# Patient Record
Sex: Female | Born: 1975 | Race: White | Hispanic: No | Marital: Married | Smoking: Never smoker
Health system: Southern US, Community
[De-identification: ages and names within clinical notes are randomized; demographics above are authoritative.]

## PROBLEM LIST (undated history)

## (undated) DIAGNOSIS — R0602 Shortness of breath: Secondary | ICD-10-CM

## (undated) DIAGNOSIS — R112 Nausea with vomiting, unspecified: Secondary | ICD-10-CM

## (undated) DIAGNOSIS — G4710 Hypersomnia, unspecified: Secondary | ICD-10-CM

## (undated) DIAGNOSIS — R51 Headache: Secondary | ICD-10-CM

## (undated) DIAGNOSIS — D649 Anemia, unspecified: Secondary | ICD-10-CM

## (undated) DIAGNOSIS — M549 Dorsalgia, unspecified: Secondary | ICD-10-CM

## (undated) DIAGNOSIS — R600 Localized edema: Secondary | ICD-10-CM

## (undated) DIAGNOSIS — R5382 Chronic fatigue, unspecified: Secondary | ICD-10-CM

## (undated) DIAGNOSIS — N979 Female infertility, unspecified: Secondary | ICD-10-CM

## (undated) DIAGNOSIS — E538 Deficiency of other specified B group vitamins: Secondary | ICD-10-CM

## (undated) DIAGNOSIS — G4730 Sleep apnea, unspecified: Secondary | ICD-10-CM

## (undated) DIAGNOSIS — K829 Disease of gallbladder, unspecified: Secondary | ICD-10-CM

## (undated) DIAGNOSIS — F418 Other specified anxiety disorders: Secondary | ICD-10-CM

## (undated) DIAGNOSIS — I10 Essential (primary) hypertension: Secondary | ICD-10-CM

## (undated) DIAGNOSIS — G4700 Insomnia, unspecified: Secondary | ICD-10-CM

## (undated) DIAGNOSIS — D509 Iron deficiency anemia, unspecified: Secondary | ICD-10-CM

## (undated) DIAGNOSIS — N393 Stress incontinence (female) (male): Secondary | ICD-10-CM

## (undated) DIAGNOSIS — Z8711 Personal history of peptic ulcer disease: Secondary | ICD-10-CM

## (undated) DIAGNOSIS — Z8669 Personal history of other diseases of the nervous system and sense organs: Secondary | ICD-10-CM

## (undated) DIAGNOSIS — K580 Irritable bowel syndrome with diarrhea: Secondary | ICD-10-CM

## (undated) DIAGNOSIS — F32A Depression, unspecified: Secondary | ICD-10-CM

## (undated) DIAGNOSIS — G43909 Migraine, unspecified, not intractable, without status migrainosus: Secondary | ICD-10-CM

## (undated) DIAGNOSIS — K432 Incisional hernia without obstruction or gangrene: Secondary | ICD-10-CM

## (undated) DIAGNOSIS — F909 Attention-deficit hyperactivity disorder, unspecified type: Secondary | ICD-10-CM

## (undated) DIAGNOSIS — K219 Gastro-esophageal reflux disease without esophagitis: Secondary | ICD-10-CM

## (undated) DIAGNOSIS — M722 Plantar fascial fibromatosis: Secondary | ICD-10-CM

## (undated) DIAGNOSIS — G4733 Obstructive sleep apnea (adult) (pediatric): Secondary | ICD-10-CM

## (undated) DIAGNOSIS — N819 Female genital prolapse, unspecified: Secondary | ICD-10-CM

## (undated) DIAGNOSIS — F411 Generalized anxiety disorder: Secondary | ICD-10-CM

## (undated) DIAGNOSIS — F329 Major depressive disorder, single episode, unspecified: Secondary | ICD-10-CM

## (undated) DIAGNOSIS — IMO0002 Reserved for concepts with insufficient information to code with codable children: Secondary | ICD-10-CM

## (undated) DIAGNOSIS — K589 Irritable bowel syndrome without diarrhea: Secondary | ICD-10-CM

## (undated) DIAGNOSIS — Z973 Presence of spectacles and contact lenses: Secondary | ICD-10-CM

## (undated) DIAGNOSIS — Z9889 Other specified postprocedural states: Secondary | ICD-10-CM

## (undated) DIAGNOSIS — F840 Autistic disorder: Secondary | ICD-10-CM

## (undated) DIAGNOSIS — Z8679 Personal history of other diseases of the circulatory system: Secondary | ICD-10-CM

## (undated) HISTORY — DX: Other specified anxiety disorders: F41.8

## (undated) HISTORY — DX: Hypersomnia, unspecified: G47.10

## (undated) HISTORY — DX: Attention-deficit hyperactivity disorder, unspecified type: F90.9

## (undated) HISTORY — DX: Irritable bowel syndrome, unspecified: K58.9

## (undated) HISTORY — DX: Disease of gallbladder, unspecified: K82.9

## (undated) HISTORY — DX: Plantar fascial fibromatosis: M72.2

## (undated) HISTORY — DX: Localized edema: R60.0

## (undated) HISTORY — DX: Essential (primary) hypertension: I10

## (undated) HISTORY — DX: Sleep apnea, unspecified: G47.30

## (undated) HISTORY — DX: Obstructive sleep apnea (adult) (pediatric): G47.33

## (undated) HISTORY — DX: Deficiency of other specified B group vitamins: E53.8

## (undated) HISTORY — DX: Dorsalgia, unspecified: M54.9

## (undated) HISTORY — DX: Gastro-esophageal reflux disease without esophagitis: K21.9

## (undated) HISTORY — DX: Shortness of breath: R06.02

## (undated) HISTORY — DX: Female infertility, unspecified: N97.9

## (undated) HISTORY — DX: Autistic disorder: F84.0

## (undated) HISTORY — DX: Migraine, unspecified, not intractable, without status migrainosus: G43.909

## (undated) HISTORY — DX: Incisional hernia without obstruction or gangrene: K43.2

## (undated) HISTORY — DX: Depression, unspecified: F32.A

## (undated) HISTORY — DX: Personal history of peptic ulcer disease: Z87.11

## (undated) HISTORY — DX: Major depressive disorder, single episode, unspecified: F32.9

## (undated) HISTORY — DX: Chronic fatigue, unspecified: R53.82

## (undated) SURGERY — Surgical Case
Anesthesia: *Unknown

---

## 2003-11-25 DIAGNOSIS — Z9884 Bariatric surgery status: Secondary | ICD-10-CM

## 2003-11-25 HISTORY — PX: ROUX-EN-Y GASTRIC BYPASS: SHX1104

## 2003-11-25 HISTORY — DX: Bariatric surgery status: Z98.84

## 2004-11-24 HISTORY — PX: CHOLECYSTECTOMY: SHX55

## 2004-11-24 HISTORY — PX: LAPAROSCOPIC CHOLECYSTECTOMY: SUR755

## 2005-08-22 ENCOUNTER — Other Ambulatory Visit: Admission: RE | Admit: 2005-08-22 | Discharge: 2005-08-22 | Payer: Self-pay | Admitting: Family Medicine

## 2006-08-30 ENCOUNTER — Emergency Department (HOSPITAL_COMMUNITY): Admission: EM | Admit: 2006-08-30 | Discharge: 2006-08-30 | Payer: Self-pay | Admitting: Emergency Medicine

## 2007-03-15 ENCOUNTER — Ambulatory Visit (HOSPITAL_COMMUNITY): Payer: Self-pay | Admitting: Psychiatry

## 2007-04-13 ENCOUNTER — Ambulatory Visit (HOSPITAL_COMMUNITY): Payer: Self-pay | Admitting: Psychiatry

## 2007-07-30 ENCOUNTER — Ambulatory Visit (HOSPITAL_COMMUNITY): Payer: Self-pay | Admitting: Psychiatry

## 2007-11-02 ENCOUNTER — Ambulatory Visit (HOSPITAL_COMMUNITY): Payer: Self-pay | Admitting: Psychiatry

## 2008-02-03 ENCOUNTER — Ambulatory Visit (HOSPITAL_COMMUNITY): Payer: Self-pay | Admitting: Psychiatry

## 2008-08-11 ENCOUNTER — Ambulatory Visit (HOSPITAL_COMMUNITY): Payer: Self-pay | Admitting: Psychiatry

## 2008-12-20 ENCOUNTER — Ambulatory Visit (HOSPITAL_COMMUNITY): Payer: Self-pay | Admitting: Psychiatry

## 2009-01-16 ENCOUNTER — Emergency Department (HOSPITAL_COMMUNITY): Admission: EM | Admit: 2009-01-16 | Discharge: 2009-01-16 | Payer: Self-pay | Admitting: Emergency Medicine

## 2009-01-23 ENCOUNTER — Ambulatory Visit (HOSPITAL_COMMUNITY): Payer: Self-pay | Admitting: Psychiatry

## 2009-01-25 ENCOUNTER — Ambulatory Visit: Payer: Self-pay | Admitting: Family Medicine

## 2009-05-01 ENCOUNTER — Ambulatory Visit (HOSPITAL_COMMUNITY): Payer: Self-pay | Admitting: Psychiatry

## 2009-08-27 ENCOUNTER — Ambulatory Visit: Payer: Self-pay | Admitting: Family Medicine

## 2009-10-22 ENCOUNTER — Inpatient Hospital Stay (HOSPITAL_COMMUNITY): Admission: EM | Admit: 2009-10-22 | Discharge: 2009-10-26 | Payer: Self-pay | Admitting: Emergency Medicine

## 2009-10-23 HISTORY — PX: LAPAROSCOPIC GASTROTOMY W/ REPAIR OF ULCER: SUR772

## 2009-11-14 ENCOUNTER — Ambulatory Visit (HOSPITAL_COMMUNITY): Payer: Self-pay | Admitting: Psychiatry

## 2009-12-07 ENCOUNTER — Ambulatory Visit (HOSPITAL_COMMUNITY): Admission: RE | Admit: 2009-12-07 | Discharge: 2009-12-07 | Payer: Self-pay | Admitting: Gastroenterology

## 2009-12-27 ENCOUNTER — Ambulatory Visit: Payer: Self-pay | Admitting: Family Medicine

## 2009-12-27 DIAGNOSIS — F419 Anxiety disorder, unspecified: Secondary | ICD-10-CM | POA: Insufficient documentation

## 2009-12-27 DIAGNOSIS — F329 Major depressive disorder, single episode, unspecified: Secondary | ICD-10-CM

## 2009-12-27 DIAGNOSIS — R05 Cough: Secondary | ICD-10-CM

## 2009-12-27 DIAGNOSIS — R059 Cough, unspecified: Secondary | ICD-10-CM | POA: Insufficient documentation

## 2009-12-27 DIAGNOSIS — F3289 Other specified depressive episodes: Secondary | ICD-10-CM | POA: Insufficient documentation

## 2010-04-29 ENCOUNTER — Ambulatory Visit (HOSPITAL_COMMUNITY): Admission: RE | Admit: 2010-04-29 | Discharge: 2010-04-29 | Payer: Self-pay | Admitting: Family Medicine

## 2010-04-29 ENCOUNTER — Ambulatory Visit: Payer: Self-pay | Admitting: Family Medicine

## 2010-04-30 ENCOUNTER — Encounter: Admission: RE | Admit: 2010-04-30 | Discharge: 2010-06-11 | Payer: Self-pay | Admitting: Family Medicine

## 2010-05-07 ENCOUNTER — Ambulatory Visit (HOSPITAL_COMMUNITY): Admission: RE | Admit: 2010-05-07 | Discharge: 2010-05-07 | Payer: Self-pay | Admitting: Gastroenterology

## 2010-06-12 ENCOUNTER — Emergency Department (HOSPITAL_COMMUNITY): Admission: EM | Admit: 2010-06-12 | Discharge: 2010-06-12 | Payer: Self-pay | Admitting: Emergency Medicine

## 2010-10-16 ENCOUNTER — Ambulatory Visit (HOSPITAL_COMMUNITY): Payer: Self-pay | Admitting: Psychiatry

## 2010-11-08 ENCOUNTER — Ambulatory Visit (HOSPITAL_COMMUNITY)
Admission: RE | Admit: 2010-11-08 | Discharge: 2010-11-08 | Payer: Self-pay | Source: Home / Self Care | Attending: Gastroenterology | Admitting: Gastroenterology

## 2010-12-26 NOTE — Assessment & Plan Note (Signed)
Summary: SORE THROAT & COUGH/KH   Vital Signs:  Patient Profile:   35 Years Old Female CC:      Cold & URI symptoms Height:     67 inches Weight:      157 pounds O2 Sat:      98 % O2 treatment:    Room Air Temp:     99.2 degrees F oral Pulse rate:   110 / minute Pulse rhythm:   regular Resp:     16 per minute BP sitting:   123 / 86  (right arm) Cuff size:   regular  Pt. in pain?   no  Vitals Entered By: Osborne Casco, RN                   Updated Prior Medication List: NEXIUM 40 MG CPDR (ESOMEPRAZOLE MAGNESIUM) once daily CELEXA 20 MG TABS (CITALOPRAM HYDROBROMIDE) once daily WELLBUTRIN XL 150 MG XR24H-TAB (BUPROPION HCL) once daily VYVANSE 50 MG CAPS (LISDEXAMFETAMINE DIMESYLATE) BID CALCIUM + D 315-200 MG-UNIT TABS (CALCIUM CITRATE-VITAMIN D) BID VITAMIN B-12 1000 MCG TABS (CYANOCOBALAMIN) once daily  Current Allergies: ! * SEASONALHistory of Present Illness History from: patient Chief Complaint: Cold & URI symptoms History of Present Illness: Patient c/o productive cough, fatigue, congestion, sore throat, body aches, low-grade fever, X 4 days. Eye pain started today. SHe has taken mucinex D, sudafed, and tylenol for symptom and fever relief. NO N/V/D/C  REVIEW OF SYSTEMS Constitutional Symptoms       Complains of fever.     Denies chills, night sweats, weight loss, weight gain, and fatigue.  Eyes       Complains of eye pain.      Denies change in vision, eye discharge, glasses, contact lenses, and eye surgery. Ear/Nose/Throat/Mouth       Complains of frequent runny nose, sinus problems, sore throat, and hoarseness.      Denies hearing loss/aids, change in hearing, ear pain, ear discharge, dizziness, frequent nose bleeds, and tooth pain or bleeding.  Respiratory       Complains of productive cough.      Denies dry cough, wheezing, shortness of breath, asthma, bronchitis, and emphysema/COPD.  Cardiovascular       Denies murmurs, chest pain, and tires easily with  exhertion.    Gastrointestinal       Denies stomach pain, nausea/vomiting, diarrhea, constipation, blood in bowel movements, and indigestion. Genitourniary       Denies painful urination, kidney stones, and loss of urinary control. Neurological       Denies paralysis, seizures, and fainting/blackouts. Musculoskeletal       Denies muscle pain, joint pain, joint stiffness, decreased range of motion, redness, swelling, muscle weakness, and gout.      Comments: body aches Skin       Denies bruising, unusual mles/lumps or sores, and hair/skin or nail changes.  Psych       Denies mood changes, temper/anger issues, anxiety/stress, speech problems, depression, and sleep problems.  Past History:  Past Medical History: ADD Depression  Past Surgical History: ulcer reapir 09/2009 Gastric bypass Cholecystectomy-2006  Family History: unremarkable  Social History: Single Never Smoked Alcohol use-no Drug use-no Smoking Status:  never Drug Use:  no Physical Exam General appearance: well developed, well nourished, no acute distress Eyes: conjunctivae and lids normal Ears: normal, no lesions or deformities Nasal: CONGESTED Oral/Pharynx: MILD ERYTHEMA Neck: neck supple,  trachea midline, no masses Chest/Lungs: no rales, wheezes, or rhonchi bilateral, breath sounds equal without effort.  CONGESTED COUGH Heart: regular rate and  rhythm, no murmur Extremities: normal extremities Skin: no obvious rashes or lesions Assessment New Problems: COUGH (ICD-786.2) UPPER RESPIRATORY INFECTION, ACUTE (ICD-465.9) DEPRESSION (ICD-311)   Plan New Medications/Changes: CHERATUSSIN AC 100-10 MG/5ML SYRP (GUAIFENESIN-CODEINE) 1-2 TSP by mouth Q 6 HRS as needed COUGH  #4 OZ x 0, 12/27/2009, Kandy Towery DO ZITHROMAX Z-PAK 250 MG TABS (AZITHROMYCIN) TAKE AS DIRECTED  #1 pk x 0, 12/27/2009, Marvis Moeller DO  New Orders: New Patient Level III [99203]   Prescriptions: CHERATUSSIN AC 100-10  MG/5ML SYRP (GUAIFENESIN-CODEINE) 1-2 TSP by mouth Q 6 HRS as needed COUGH  #4 OZ x 0   Entered and Authorized by:   Marvis Moeller DO   Signed by:   Marvis Moeller DO on 12/27/2009   Method used:   Print then Give to Patient   RxID:   [id number] ZITHROMAX Z-PAK 250 MG TABS (AZITHROMYCIN) TAKE AS DIRECTED  #1 pk x 0   Entered and Authorized by:   Marvis Moeller DO   Signed by:   Marvis Moeller DO on 12/27/2009   Method used:   Print then Give to Patient   RxID:   [id number]   Patient Instructions: 1)  TYLENOL OR MOTRIN AS NEEDED. AVOID CAFFEINE AND MILK PRODUCTS. FOLLOW UP WITH YOUR PCP OR RETURN IF SYMPTOMS PERSIST.

## 2011-01-19 IMAGING — CT CT ABDOMEN W/ CM
1 series · 16 of 32 positions shown, 20 images · IV contrast (agent unspecified)
Comparison: None

CT ABDOMEN

CLINICAL DATA: Abdominal pain.  History gastric bypass.

CT ABDOMEN AND PELVIS WITH CONTRAST
TECHNIQUE: Multidetector CT imaging of the abdomen and pelvis was
performed using the standard protocol following bolus
administration of intravenous contrast.
Contrast: 100 ml Amnipaque-GEE IV.

[Series 2: rtn ap with st · axial · 0.69mm/px · z∈[-318,+112]mm · 16 of 97 slices shown, 20 images]
[im 7/97  soft-tissue]
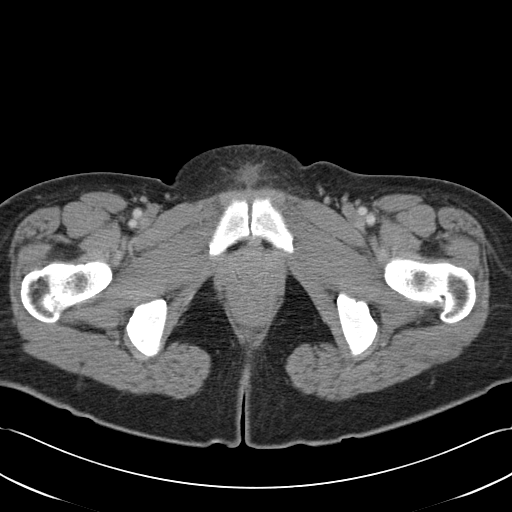
[im 7/97  bone]
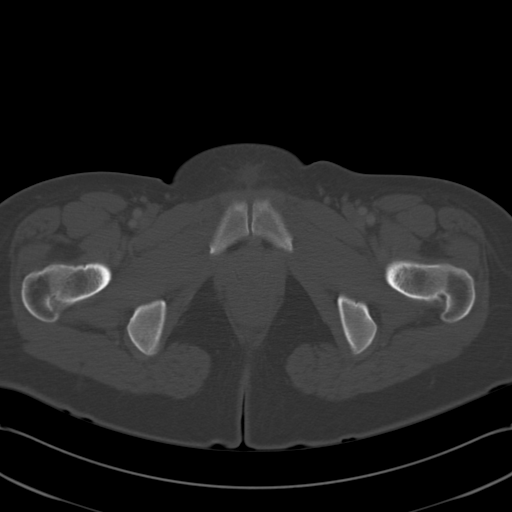
[im 13/97  soft-tissue]
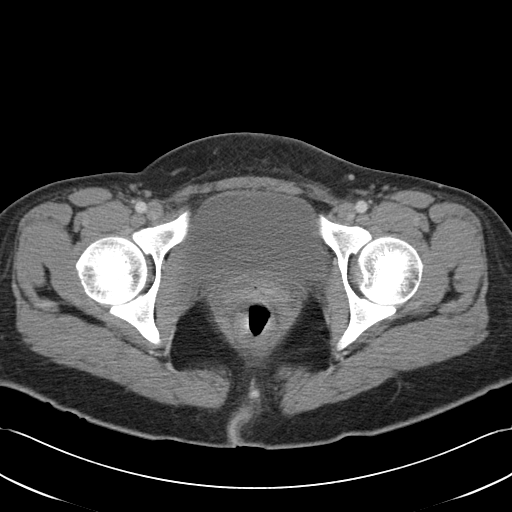
[im 19/97  soft-tissue]
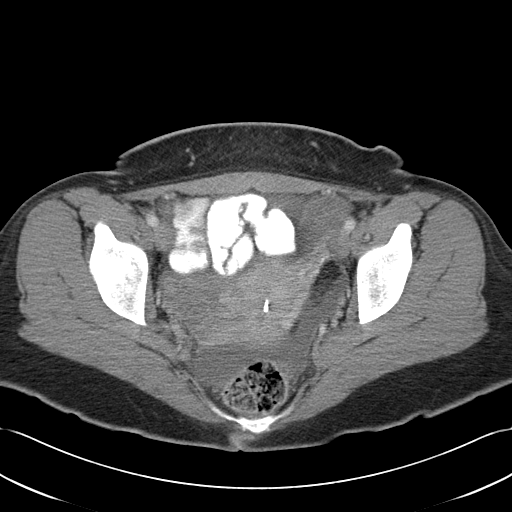
[im 25/97  soft-tissue]
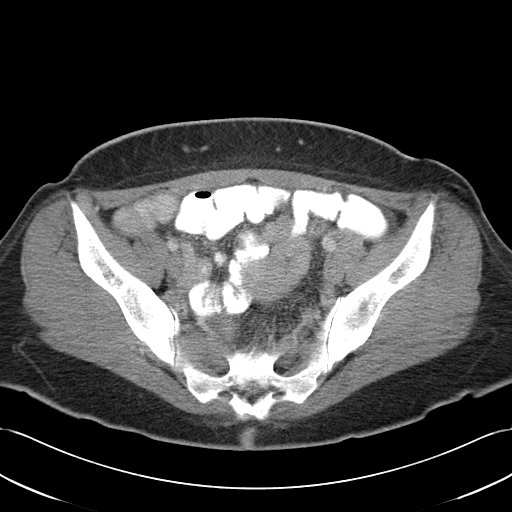
[im 31/97  soft-tissue]
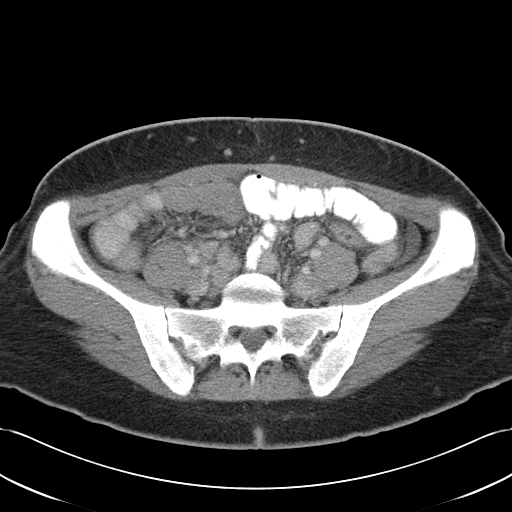
[im 38/97  soft-tissue]
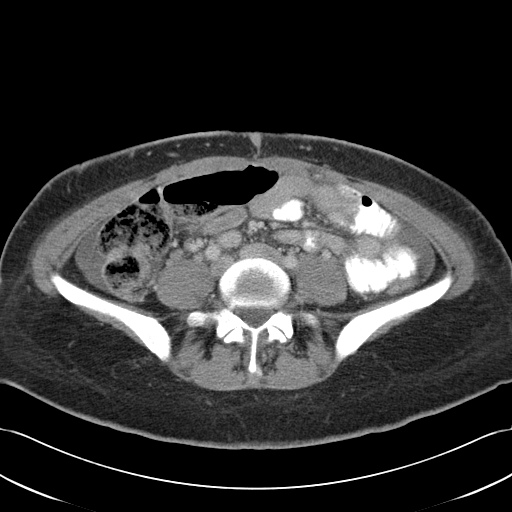
[im 44/97  soft-tissue]
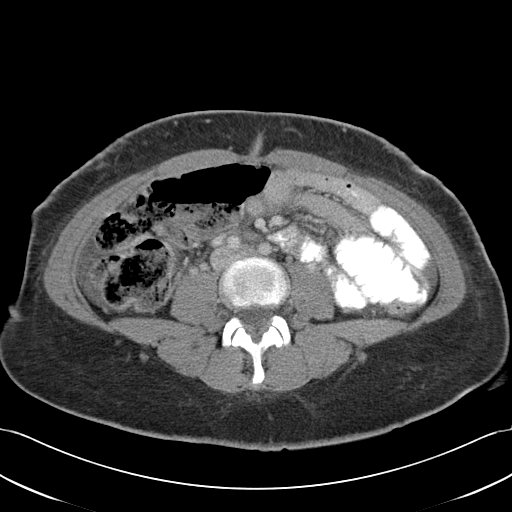
[im 53/97  soft-tissue]
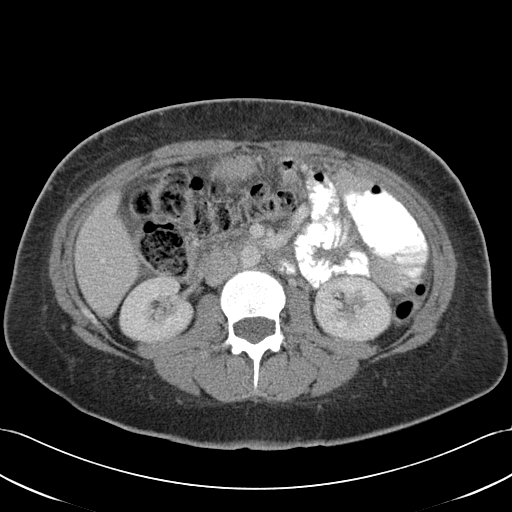
[im 59/97  soft-tissue]
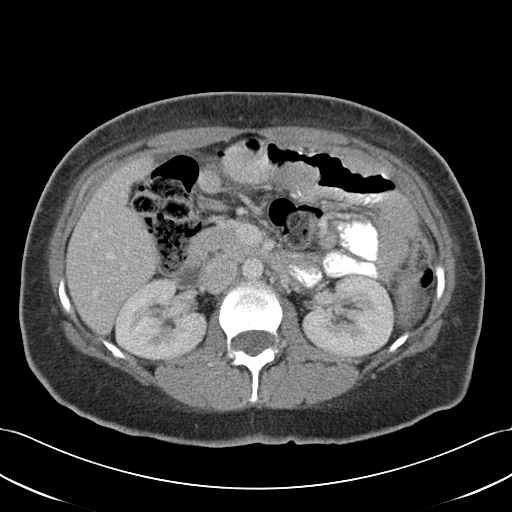
[im 59/97  bone]
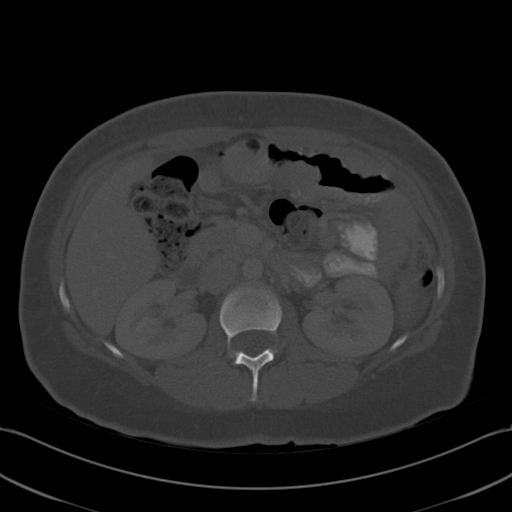
[im 66/97  soft-tissue]
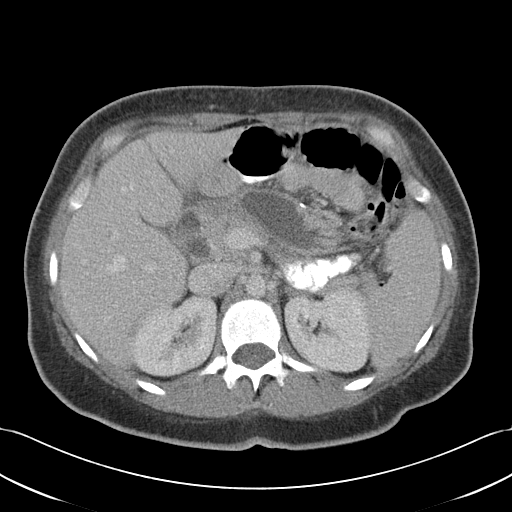
[im 72/97  soft-tissue]
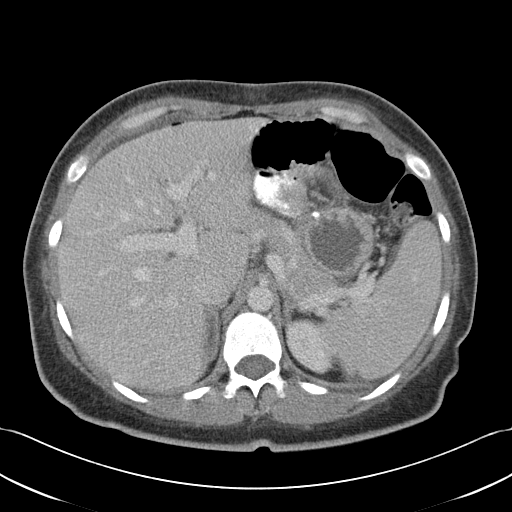
[im 78/97  soft-tissue]
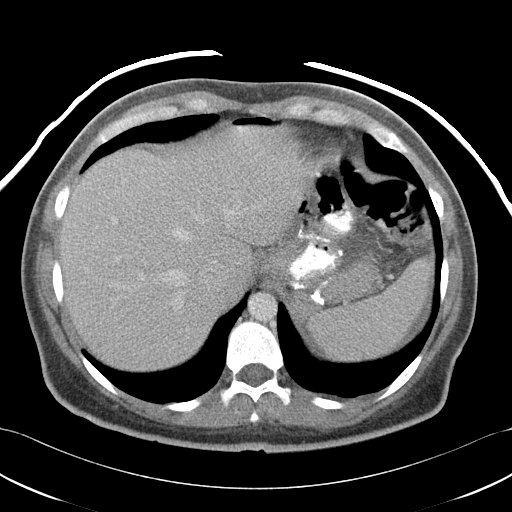
[im 84/97  soft-tissue]
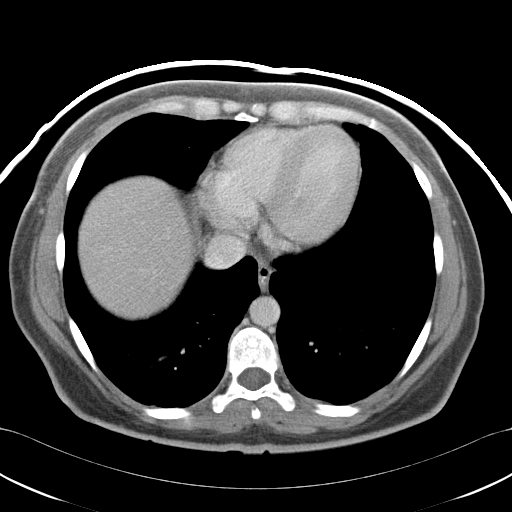
[im 84/97  lung]
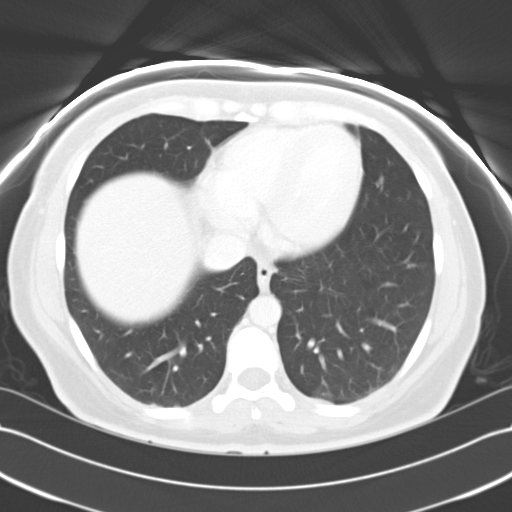
[im 87/97  lung]
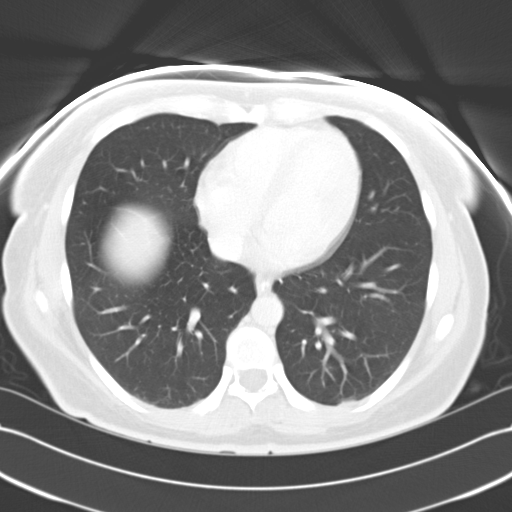
[im 90/97  soft-tissue]
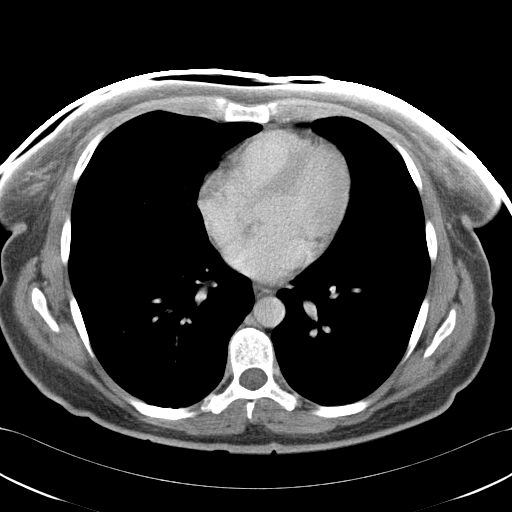
[im 90/97  lung]
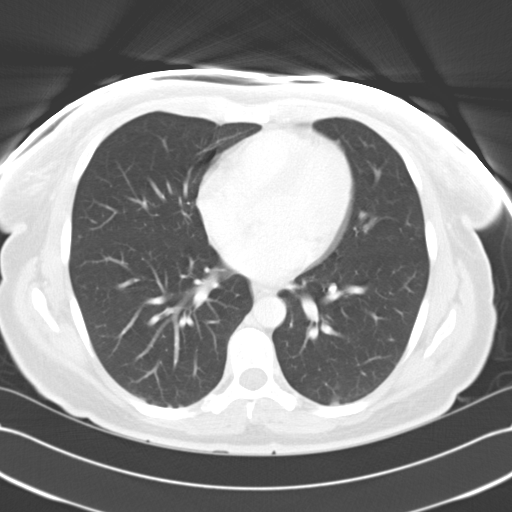
[im 93/97  lung]
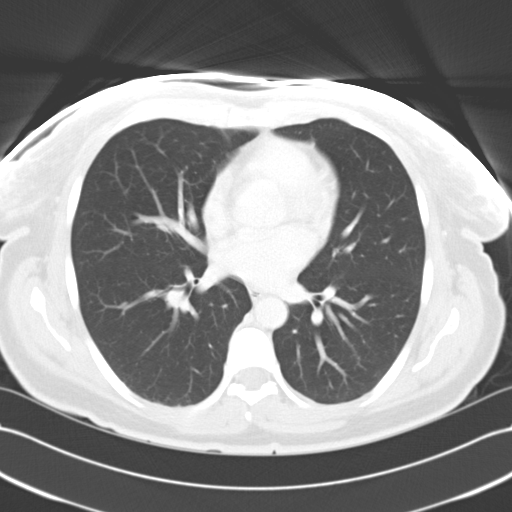

[16 of 32 positions shown; findings below may reference images not displayed]

FINDINGS: The patient has probably had a Roux-en-Y gastric bypass.
There is free intraperitoneal gas and free fluid. I cannot detect a
definite site for a the etiology of the free air and free fluid.
Considerations would include a disruption of the Roux-en-Y
anatomy., for example of the gastrojejunal anastomosis

Liver, spleen, pancreas, kidneys, and adrenal glands are
unremarkable.
IMPRESSION: Pneumoperitoneum.  See comments above.

CT PELVIS
FINDINGS: Free intraperitoneal fluid.  This may be due to bowel
perforation.  IUD in the uterus.
IMPRESSION: Free intraperitoneal fluid.

Critical test results telephoned to Dr.Shalley at the time of
interpretation on 10/22/2009 at 8226 hours.

## 2011-02-08 LAB — URINALYSIS, ROUTINE W REFLEX MICROSCOPIC
Bilirubin Urine: NEGATIVE
Glucose, UA: NEGATIVE mg/dL
Hgb urine dipstick: NEGATIVE
Ketones, ur: NEGATIVE mg/dL
Nitrite: NEGATIVE
Protein, ur: NEGATIVE mg/dL
Specific Gravity, Urine: 1.025 (ref 1.005–1.030)
Urobilinogen, UA: 0.2 mg/dL (ref 0.0–1.0)
pH: 6 (ref 5.0–8.0)

## 2011-02-08 LAB — DIFFERENTIAL
Basophils Absolute: 0 K/uL (ref 0.0–0.1)
Basophils Relative: 1 % (ref 0–1)
Eosinophils Absolute: 0.1 10*3/uL (ref 0.0–0.7)
Eosinophils Relative: 1 % (ref 0–5)
Lymphocytes Relative: 27 % (ref 12–46)
Lymphs Abs: 2.1 10*3/uL (ref 0.7–4.0)
Monocytes Absolute: 0.9 10*3/uL (ref 0.1–1.0)
Monocytes Relative: 11 % (ref 3–12)
Neutro Abs: 4.7 K/uL (ref 1.7–7.7)
Neutrophils Relative %: 60 % (ref 43–77)

## 2011-02-08 LAB — COMPREHENSIVE METABOLIC PANEL WITH GFR
Albumin: 3.7 g/dL (ref 3.5–5.2)
BUN: 15 mg/dL (ref 6–23)
CO2: 24 meq/L (ref 19–32)
Calcium: 8.5 mg/dL (ref 8.4–10.5)
Creatinine, Ser: 0.72 mg/dL (ref 0.4–1.2)
GFR calc Af Amer: >60 mL/min (ref >60)
Potassium: 3.7 meq/L (ref 3.5–5.1)
Total Protein: 6.6 g/dL (ref 6.0–8.3)

## 2011-02-08 LAB — CBC
HCT: 37 % (ref 36.0–46.0)
Hemoglobin: 12.4 g/dL (ref 12.0–15.0)
MCH: 30.1 pg (ref 26.0–34.0)
MCHC: 33.4 g/dL (ref 30.0–36.0)
MCV: 90.2 fL (ref 78.0–100.0)
Platelets: 312 10*3/uL (ref 150–400)
RBC: 4.1 MIL/uL (ref 3.87–5.11)
RDW: 13.9 % (ref 11.5–15.5)
WBC: 7.7 K/uL (ref 4.0–10.5)

## 2011-02-08 LAB — COMPREHENSIVE METABOLIC PANEL
ALT: 13 U/L (ref 0–35)
AST: 17 U/L (ref 0–37)
Alkaline Phosphatase: 74 U/L (ref 39–117)
Chloride: 108 mEq/L (ref 96–112)
GFR calc non Af Amer: >60 mL/min (ref >60)
Glucose, Bld: 55 mg/dL — ABNORMAL LOW (ref 70–99)
Sodium: 139 mEq/L (ref 135–145)
Total Bilirubin: 0.3 mg/dL (ref 0.3–1.2)

## 2011-02-08 LAB — LIPASE, BLOOD: Lipase: 39 U/L (ref 11–59)

## 2011-02-08 LAB — GLUCOSE, CAPILLARY: Glucose-Capillary: 85 mg/dL (ref 70–99)

## 2011-02-08 LAB — POCT PREGNANCY, URINE: Preg Test, Ur: NEGATIVE

## 2011-02-18 ENCOUNTER — Encounter (HOSPITAL_COMMUNITY): Payer: Commercial Managed Care - PPO | Admitting: Physician Assistant

## 2011-02-18 DIAGNOSIS — F329 Major depressive disorder, single episode, unspecified: Secondary | ICD-10-CM

## 2011-02-18 DIAGNOSIS — F3289 Other specified depressive episodes: Secondary | ICD-10-CM

## 2011-02-18 DIAGNOSIS — F988 Other specified behavioral and emotional disorders with onset usually occurring in childhood and adolescence: Secondary | ICD-10-CM

## 2011-02-20 ENCOUNTER — Emergency Department (HOSPITAL_COMMUNITY): Payer: 59

## 2011-02-20 ENCOUNTER — Inpatient Hospital Stay (HOSPITAL_COMMUNITY)
Admission: EM | Admit: 2011-02-20 | Discharge: 2011-02-27 | DRG: 326 | Disposition: A | Discharge status: Home / Self Care | Payer: 59 | Attending: General Surgery | Admitting: General Surgery

## 2011-02-20 DIAGNOSIS — K9509 Other complications of gastric band procedure: Principal | ICD-10-CM | POA: Diagnosis present

## 2011-02-20 DIAGNOSIS — F3289 Other specified depressive episodes: Secondary | ICD-10-CM | POA: Diagnosis present

## 2011-02-20 DIAGNOSIS — F329 Major depressive disorder, single episode, unspecified: Secondary | ICD-10-CM | POA: Diagnosis present

## 2011-02-20 DIAGNOSIS — I10 Essential (primary) hypertension: Secondary | ICD-10-CM | POA: Diagnosis present

## 2011-02-20 DIAGNOSIS — F909 Attention-deficit hyperactivity disorder, unspecified type: Secondary | ICD-10-CM | POA: Diagnosis present

## 2011-02-20 DIAGNOSIS — K285 Chronic or unspecified gastrojejunal ulcer with perforation: Secondary | ICD-10-CM | POA: Diagnosis present

## 2011-02-20 DIAGNOSIS — Y832 Surgical operation with anastomosis, bypass or graft as the cause of abnormal reaction of the patient, or of later complication, without mention of misadventure at the time of the procedure: Secondary | ICD-10-CM | POA: Diagnosis present

## 2011-02-20 DIAGNOSIS — K5900 Constipation, unspecified: Secondary | ICD-10-CM | POA: Diagnosis not present

## 2011-02-20 DIAGNOSIS — F17200 Nicotine dependence, unspecified, uncomplicated: Secondary | ICD-10-CM | POA: Diagnosis present

## 2011-02-20 DIAGNOSIS — K668 Other specified disorders of peritoneum: Secondary | ICD-10-CM | POA: Diagnosis present

## 2011-02-20 LAB — CBC
Hemoglobin: 13.4 g/dL (ref 12.0–15.0)
MCH: 29 pg (ref 26.0–34.0)
MCHC: 32.6 g/dL (ref 30.0–36.0)
RDW: 12.7 % (ref 11.5–15.5)

## 2011-02-20 LAB — COMPREHENSIVE METABOLIC PANEL
ALT: 18 U/L (ref 0–35)
AST: 18 U/L (ref 0–37)
Alkaline Phosphatase: 81 U/L (ref 39–117)
BUN: 14 mg/dL (ref 6–23)
CO2: 24 mEq/L (ref 19–32)
Calcium: 8.7 mg/dL (ref 8.4–10.5)
Chloride: 107 mEq/L (ref 96–112)
Creatinine, Ser: 0.6 mg/dL (ref 0.4–1.2)
GFR calc Af Amer: >60 mL/min (ref >60)
Glucose, Bld: 101 mg/dL — ABNORMAL HIGH (ref 70–99)
Sodium: 140 mEq/L (ref 135–145)
Total Bilirubin: 0.4 mg/dL (ref 0.3–1.2)

## 2011-02-20 LAB — DIFFERENTIAL
Basophils Absolute: 0.1 10*3/uL (ref 0.0–0.1)
Basophils Relative: 1 % (ref 0–1)
Eosinophils Absolute: 0.1 10*3/uL (ref 0.0–0.7)
Lymphocytes Relative: 40 % (ref 12–46)
Monocytes Absolute: 0.6 10*3/uL (ref 0.1–1.0)
Monocytes Relative: 9 % (ref 3–12)
Neutrophils Relative %: 49 % (ref 43–77)

## 2011-02-20 LAB — ABO/RH: ABO/RH(D): A POS

## 2011-02-20 LAB — LIPASE, BLOOD: Lipase: 35 U/L (ref 11–59)

## 2011-02-20 LAB — APTT: aPTT: 29 seconds (ref 24–37)

## 2011-02-20 LAB — MAGNESIUM: Magnesium: 2.1 mg/dL (ref 1.5–2.5)

## 2011-02-20 MED ORDER — IOHEXOL 300 MG/ML  SOLN
100.0000 mL | Freq: Once | INTRAMUSCULAR | Status: AC | PRN
  Administered 2011-02-20: 100 mL via INTRAVENOUS

## 2011-02-21 ENCOUNTER — Inpatient Hospital Stay (HOSPITAL_COMMUNITY): Payer: 59

## 2011-02-21 HISTORY — PX: LAPAROSCOPY ABDOMEN DIAGNOSTIC: PRO50

## 2011-02-21 HISTORY — PX: OTHER SURGICAL HISTORY: SHX169

## 2011-02-21 LAB — CBC
HCT: 33.4 % — ABNORMAL LOW (ref 36.0–46.0)
Hemoglobin: 10.7 g/dL — ABNORMAL LOW (ref 12.0–15.0)
MCHC: 32 g/dL (ref 30.0–36.0)
MCV: 89.3 fL (ref 78.0–100.0)
RDW: 12.8 % (ref 11.5–15.5)
WBC: 19 10*3/uL — ABNORMAL HIGH (ref 4.0–10.5)

## 2011-02-21 LAB — PROTIME-INR: INR: 1.18 (ref 0.00–1.49)

## 2011-02-21 LAB — BASIC METABOLIC PANEL
BUN: 11 mg/dL (ref 6–23)
CO2: 27 mEq/L (ref 19–32)
Chloride: 105 mEq/L (ref 96–112)
Creatinine, Ser: 0.61 mg/dL (ref 0.4–1.2)
Potassium: 4.2 mEq/L (ref 3.5–5.1)

## 2011-02-21 LAB — MAGNESIUM: Magnesium: 1.6 mg/dL (ref 1.5–2.5)

## 2011-02-22 LAB — BASIC METABOLIC PANEL
BUN: 4 mg/dL — ABNORMAL LOW (ref 6–23)
CO2: 29 mEq/L (ref 19–32)
Glucose, Bld: 85 mg/dL (ref 70–99)
Potassium: 3.7 mEq/L (ref 3.5–5.1)
Sodium: 136 mEq/L (ref 135–145)

## 2011-02-22 LAB — CBC
HCT: 35.4 % — ABNORMAL LOW (ref 36.0–46.0)
Hemoglobin: 11.4 g/dL — ABNORMAL LOW (ref 12.0–15.0)
MCV: 89.8 fL (ref 78.0–100.0)
RDW: 12.8 % (ref 11.5–15.5)
WBC: 7.6 10*3/uL (ref 4.0–10.5)

## 2011-02-24 ENCOUNTER — Inpatient Hospital Stay (HOSPITAL_COMMUNITY): Payer: 59

## 2011-02-24 LAB — CBC
HCT: 35.5 % — ABNORMAL LOW (ref 36.0–46.0)
Hemoglobin: 11.6 g/dL — ABNORMAL LOW (ref 12.0–15.0)
MCH: 28.9 pg (ref 26.0–34.0)
MCHC: 32.7 g/dL (ref 30.0–36.0)
MCV: 88.3 fL (ref 78.0–100.0)
RDW: 12.3 % (ref 11.5–15.5)

## 2011-02-24 LAB — CROSSMATCH
ABO/RH(D): A POS
Antibody Screen: NEGATIVE
Unit division: 0

## 2011-02-24 LAB — DIFFERENTIAL
Eosinophils Relative: 2 % (ref 0–5)
Lymphocytes Relative: 29 % (ref 12–46)
Lymphs Abs: 1.2 10*3/uL (ref 0.7–4.0)
Monocytes Absolute: 0.5 10*3/uL (ref 0.1–1.0)
Monocytes Relative: 14 % — ABNORMAL HIGH (ref 3–12)
Neutro Abs: 2.2 10*3/uL (ref 1.7–7.7)

## 2011-02-24 MED ORDER — IOHEXOL 300 MG/ML  SOLN
100.0000 mL | Freq: Once | INTRAMUSCULAR | Status: AC | PRN
  Administered 2011-02-24: 100 mL via ORAL

## 2011-02-25 LAB — CBC
HCT: 33.6 % — ABNORMAL LOW (ref 36.0–46.0)
Hemoglobin: 11.2 g/dL — ABNORMAL LOW (ref 12.0–15.0)
MCHC: 33.3 g/dL (ref 30.0–36.0)
MCV: 89.7 fL (ref 78.0–100.0)
MCV: 90.3 fL (ref 78.0–100.0)
Platelets: 191 10*3/uL (ref 150–400)
RBC: 3.47 MIL/uL — ABNORMAL LOW (ref 3.87–5.11)
RDW: 13.9 % (ref 11.5–15.5)
RDW: 14.2 % (ref 11.5–15.5)

## 2011-02-25 LAB — CREATININE, SERUM
Creatinine, Ser: 0.56 mg/dL (ref 0.4–1.2)
GFR calc Af Amer: >60 mL/min (ref >60)
GFR calc non Af Amer: >60 mL/min (ref >60)

## 2011-02-25 LAB — AMYLASE: Amylase: 28 U/L (ref 0–105)

## 2011-02-26 LAB — CBC
HCT: 35.3 % — ABNORMAL LOW (ref 36.0–46.0)
HCT: 39.2 % (ref 36.0–46.0)
MCHC: 33.7 g/dL (ref 30.0–36.0)
Platelets: 228 10*3/uL (ref 150–400)
Platelets: 253 10*3/uL (ref 150–400)
RDW: 13.6 % (ref 11.5–15.5)
WBC: 10.5 10*3/uL (ref 4.0–10.5)

## 2011-02-26 LAB — ANAEROBIC CULTURE

## 2011-02-26 LAB — COMPREHENSIVE METABOLIC PANEL
ALT: 12 U/L (ref 0–35)
AST: 13 U/L (ref 0–37)
Albumin: 3.3 g/dL — ABNORMAL LOW (ref 3.5–5.2)
Alkaline Phosphatase: 67 U/L (ref 39–117)
Chloride: 106 mEq/L (ref 96–112)
GFR calc Af Amer: >60 mL/min (ref >60)
Potassium: 3.5 mEq/L (ref 3.5–5.1)
Sodium: 139 mEq/L (ref 135–145)
Total Bilirubin: 1 mg/dL (ref 0.3–1.2)

## 2011-02-26 LAB — URINALYSIS, ROUTINE W REFLEX MICROSCOPIC
Glucose, UA: NEGATIVE mg/dL
Nitrite: NEGATIVE
Protein, ur: NEGATIVE mg/dL
Urobilinogen, UA: 0.2 mg/dL (ref 0.0–1.0)

## 2011-02-26 LAB — DIFFERENTIAL
Basophils Absolute: 0.1 10*3/uL (ref 0.0–0.1)
Basophils Relative: 1 % (ref 0–1)
Eosinophils Relative: 0 % (ref 0–5)
Monocytes Absolute: 0.5 10*3/uL (ref 0.1–1.0)
Monocytes Relative: 4 % (ref 3–12)

## 2011-02-26 LAB — WOUND CULTURE

## 2011-02-26 LAB — CREATININE, SERUM: GFR calc non Af Amer: >60 mL/min (ref >60)

## 2011-02-27 LAB — MISCELLANEOUS TEST

## 2011-02-27 NOTE — Op Note (Signed)
NAMEVERENISE, MOULIN               ACCOUNT NO.:  [id number]  MEDICAL RECORD NO.:  [id number]           PATIENT TYPE:  I  LOCATION:  1530                         FACILITY:  [facility]  PHYSICIAN:  Mary Sella. Andrey Campanile, MD     DATE OF BIRTH:  [birth year]  DATE OF PROCEDURE:  02/21/2011 DATE OF DISCHARGE:                              OPERATIVE REPORT   PREOPERATIVE DIAGNOSES: 1. Pneumoperitoneum. 2. Likely gastrojejunal anastomotic perforation.  POSTOPERATIVE DIAGNOSES: 1. Pneumoperitoneum. 2. Gastrojejunal anastomotic perforation.  PROCEDURES: 1. Diagnostic laparoscopy. 2. Laparoscopic omental patch repair gastrojejunal anastomotic     perforation. 3. Esophagogastroduodenoscopy by Dr. Janee Morn. 4. Placement of 19-French round drain.  SURGEON:  Mary Sella. Andrey Campanile, MD  ASSISTANT SURGEON:  Gabrielle Dare. Janee Morn, M.D.  ANESTHESIA:  General plus local consisting 4% Marcaine with epi.  ESTIMATED BLOOD LOSS:  Minimal.  SPECIMENS:  None.  FINDINGS:  Upon insufflating her abdomen and looking around, there is a large amount of greenish fluid in her abdominal cavity.  There is no enteric contents.  Her gastrojejunal anastomosis was visualized, identifying the Roux limb.  Anteriorly in the gastrojejunal anastomosis, there appeared to be a little bit of sloughing of tissue which was about maybe 2 mm in size.  This appeared to be  the area of perforation.  I ran her Roux limb all the way down to her JJ anastomosis and that appeared to be intact.  Dr. Janee Morn placed an Olympus endoscope down into her oropharynx and guided down esophagus into her pouch.  Her pouch appeared to be about 5 cm in length.  We identified a small perforation in the crotch of the Roux limb as it folded over at gastrojejunal anastomosis.  This was the same area as visualized anteriorly that had a small amount of sloughing of tissue that was about 2 mm in size.  I placed an omental patch over this using 3 separate 3-0 Vicryl  sutures to patch the perforation.  I then placed Tisseel over the area and the abdomen was copiously irrigated and drained.  INDICATIONS FOR PROCEDURE:  The patient is a 35 year old Caucasian female who has a history of morbid obesity who underwent a laparoscopic Roux-en-Y gastric bypass in 2005 at [facility].  She lost a fair amount of weight and actually unfortunately had a perforation in October 23, 2009, and was repaired by my partner, Dr. Karie Soda.  He found a small perforation at her gastrojejunal anastomosis that was about 2 mm in size and repaired with omental patch and drained it. Postoperatively, her recovery was unremarkable.  She has been followed by Dr. Jeani Hawking and underwent endoscopies in June and December of this past year.  On both those endoscopies, there was evidence of at least 2 ulcers at her gastrojejunal anastomosis and she had been maintained on b.i.d. Nexium as well as Carafate.  She awoke earlier today with acute onset of upper abdominal pain.  She said it was felt just like the one when she had her previous perforation.  The pain was so severe she had to pull over and call the ambulance and they finished by  transporting her to the emergency room.  A CT scan demonstrated a fair amount of free air anterior in her abdomen.  Physical exam was consistent with localized peritonitis.  I recommended diagnostic laparoscopy with omental patch repair, possible revision, possible placement of G-tube and excluded stomach as well as upper endoscopy and possible open exploration.  We discussed the risks and benefits of surgery including bleeding, infection, injuries to surrounding structures, need to convert to an open procedure, DVT occurrence, incisional hernia, wound complications, possible need for drain placement, and possible additional procedures needed in the future.  I explained to her that my primary goal tonight would be damage control  of procedure that she might need definitive surgery in the future such as a revision of her gastrojejunostomy.  She elects to proceed with surgery.  DESCRIPTION OF PROCEDURE:  After obtaining informed consent, the patient was taken to operating room, placed supine on the operating table. General endotracheal anesthesia was established.  Sequential compression devices were placed.  A Foley catheter was placed.  She received antibiotics prior to skin incision.  Surgical time-out was performed. The abdomen was prepped and draped in usual standard surgical fashion.  I elected to gain entry to the abdominal cavity using an Optiview technique in the left upper quadrant.  Her previous left upper quadrant incision was now actually overlying the rib cage.  Therefore, I made a 1- cm incision 2 fingerbreadths below her left subcostal margin little bit lateral to the midclavicular line with a #11 blade.  Then using a 5-mm scope with a 0 degree angle, passed through a 5-mm trocar, I advanced the trocar through all layers of the abdominal wall until I gained entry to the abdominal cavity.  Pneumoperitoneum was smoothly established up to a patient pressure of 15 mmHg.  The abdominal cavity was surveilled. There was gross evidence of greenish fluid throughout her entire abdominal cavity as well as down into her pelvis.  I then placed an 11- mm trocar in the supraumbilical position through her previous old incision after incising the skin and infiltrating the skin with local. I then placed a 5-mm trocar in the right upper quadrant laterally just underneath right subcostal margin and an 11-mm trocar in the right mid upper abdominal wall under direct visualization after local had been infiltrated.  She was then placed in reverse Trendelenburg.  Using one of her previous subxiphoid scars, infiltrated local through one of those scars, I made 1-cm incision and placed a 5-mm trocar through that.  The trocar  was removed and a Nathanson liver retractor was placed to lift up the left lobe of liver.  There really was not any adhesions or scar tissue.  Using atraumatic bowel graspers, I confirmed the anatomy.  She had an antecolic Roux-en-Y gastric bypass.  I visualized the blind end of the Roux limb and traced it coming up to her gastrojejunal anastomosis and going down and then I followed all the way down to her JJ anastomosis.  Her JJ anastomosis appeared intact.  There was a lot of hyperemia of her peritoneum of her abdominal wall.  There was some white phlegm on top of her gastric pouch as well as on the proximal aspect of her Roux limb.  I visualized what appeared to be an area of her gastrojejunal anastomosis that appeared somewhat denuded.  There appeared to be sloughing in this area which was about 2 mm in size.  This appeared to be the area of concern.  I visualized  her excluded stomach. As best I could tell, there appeared to be no direct gastrogastric fistula.  In order to confirm that this was the site of perforation, Dr. Janee Morn scrubbed out and placed the endoscope into her oropharynx and guided it down into her gastric pouch.  Please see his operative note for separate details.  Essentially, her pouch length was about 5 cm. There appeared to be about a 2-mm perforation in the crotch of her Roux limb just adjacent to her anastomosis.  This corresponded to the area seen externally as viewed laparoscopic that had the area of irregularity.  Since the perforation was relatively small, I elected to just to do [place] patch.  I elected not to leave the G-tube in her excluded stomach.  I was able to mobilize the piece of omentum.  I placed it laid quite easily over the area of perforation without any tension.  Taking a 3-0 Vicryl, I drove the needle through the omentum through the Roux limb, brought it up through and then anchored into the gastric pouch and tied it down.  I did this with 2  additional 3-0 Vicryl sutures, so there was a total of three 3-0 Vicryl sutures that were placed through the omentum, the Roux limb and the gastric pouch.  There was good coverage of the area of perforation.  I then irrigated her abdomen with 6 L of saline.  I visualized the area where I entered the abdominal cavity with the Optiview.  There was no evidence of bowel injury.  I then obtained Tisseel and placed the Tisseel over the omental patch.  I then obtained a 19 Blake drain and brought it through the right upper quadrant trocar incision and brought it out through the left upper quadrant trocar incision.  A Kelly was clamped on the drain so pneumoperitoneum would not escape.  The drain was placed in the upper abdomen over the area of repair along the lesser curve and then around the left upper quadrant splenic fossa.  Drain was secured to the skin with 2-0 nylon suture. The liver retractor was removed.  I withdrew the 11-mm trocar at the umbilicus, closed the fascia with a single interrupted 0 Vicryl using a Storz suture passer.  I then removed the right upper quadrant 11-mm trocar and closed it again with a single pass of 0 Vicryl using Storz and then was suture closed.  This all visualized laparoscopically through the right lateral upper quadrant 5-mm trocar.  Pneumoperitoneum was released.  Skin incisions were irrigated and the skin was reapproximated with skin staples.  She tolerated the procedure well. There were no immediate complications.  She was then taken to the recovery room in stable addition.  All needle and instrument counts were correct x2.     Mary Sella. Andrey Campanile, MD     EMW/MEDQ  D:  02/20/2011  T:  02/21/2011  Job:  [id number]  cc:   Jordan Hawks. Elnoria Howard, MD Fax: [phone]  Sharlot Gowda, M.D. Fax: [phone]  Electronically Signed by Gaynelle Adu M.D. on 02/27/2011 08:01:10 AM

## 2011-03-06 NOTE — Op Note (Signed)
  NAMEANYELI, HOCKENBURY               ACCOUNT NO.:  [id number]  MEDICAL RECORD NO.:  [id number]           PATIENT TYPE:  I  LOCATION:  1530                         FACILITY:  [facility]  PHYSICIAN:  Gabrielle Dare. Janee Morn, M.D.DATE OF BIRTH:  16-May-1976  DATE OF PROCEDURE:  02/20/2011 DATE OF DISCHARGE:                              OPERATIVE REPORT   PREOPERATIVE DIAGNOSIS:  Upper gastrointestinal perforation in a patient with previous gastric bypass.  POSTOPERATIVE DIAGNOSIS:  Perforation at gastrojejunostomy anteriorly.  PROCEDURE:  Primary esophagogastroscopy.  SURGEON:  Gabrielle Dare. Janee Morn, MD  HISTORY OF PRESENT ILLNESS:  Ms. Englander is a 35 year old female who had previous gastric bypass surgery who has perforated viscus.  She is undergoing exploratory laparoscopy by Dr. Gaynelle Adu who was assisting her with that and we were proceeding now with EGD to assist in localization of the perforation.  PROCEDURE IN DETAIL:  The patient was remained in the operating room, monitored under general anesthesia.  The esophagogastroduodenoscope was inserted gently through her mouth down into the esophagus.  There were no gross abnormalities there.  The stomach pouch was entered and the gastrojejunostomy was seen.  There was an area found with some fibrin around it that appeared to be a perforation with less than 1 cm in size. This was localized laparoscopically on the anterior surface.  Picture was taken of this perforation from the inside and no other abnormalities were seen down the blind pouch or the efferent limb.  The scope was withdrawn and Dr. Andrey Campanile continued with patch closure of the perforation.  The patient remained stable.     Gabrielle Dare Janee Morn, M.D.     BET/MEDQ  D:  02/20/2011  T:  02/21/2011  Job:  [id number]  Electronically Signed by Violeta Gelinas M.D. on 03/06/2011 04:15:20 PM

## 2011-03-14 NOTE — H&P (Signed)
[name], [name]               ACCOUNT NO.:  [id number]  MEDICAL RECORD NO.:  [id number]           PATIENT TYPE:  E  LOCATION:  WLED                         FACILITY:  [facility]  PHYSICIAN:  Velora Heckler, MD      DATE OF BIRTH:  01/24/1976  DATE OF ADMISSION:  02/20/2011 DATE OF DISCHARGE:                             HISTORY & PHYSICAL   CHIEF COMPLAINT:  Abdominal pain.  BRIEF HISTORY:  The patient is a 35 year old white female who presents for developing abdominal pain today.  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  [name] woke up around 12:30 today and had some abdominal pain that felt like gas initially.  It become significantly worse and by 2 o'clock, it was so severe, it was 10/10. [name] is to came to the ER where [name] is required a fair amount of fentanyl and Dilaudid for pain control.  Flat plate of the abdomen was the initial study and it showed a question of some free air.  [name] subsequently undergone a CT scan of the abdomen, which shows significant free air, the majority around the gastrojejunostomy anastomosis.  The full CT has not been dictated.  It was radiology's opinion that [name] had a recurrence of [name] perforated GJ anastomosis.  [name] distal anastomosis appeared to be intact.  We were asked to see the patient in consult.  PAST MEDICAL HISTORY: 1. [name] has a history of obesity and [name] underwent a gastric bypass in     2005. 2. History of depression. 3. ADHD. 4. Irritable bowel syndrome. 5. Hypertension. 6. Gastric ulcer with ongoing maximal theraphy.  PAST SURGICAL HISTORY: 1. Has a history of a gastric bypass in 2005, Roux-en-Y at [facility], Dr.     Ephriam Knuckles Man. 2. Laparoscopic cholecystectomy in 2006. 3. Perforation of the GJ anastomosis on October 22, 2009 secondary to     gastric ulcer.  At that time, [name] underwent diagnostic laparoscopic     lysis of adhesions and omental patch on October 23, 2009 by Dr.     Estelle Grumbles.  FAMILY HISTORY:  Father is unknown.  Mom is living with [name] and is in good condition.  [name] has three brothers all in good health.  SOCIAL HISTORY:  Tobacco:  None.  Alcohol:  None.  Drugs:  None.  [name] is married.  [name] husband is with [name].  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 [name] notes a good deal of family stressors of late.  REVIEW OF SYSTEMS:  CONSTITUTIONAL:  Fever:  None. CEREBROVASCULAR:  No headaches, syncope, seizure or strokes. SKIN:  No changes. PSYCHIATRIC:  Stable on meds. PULMONARY:  No orthopnea.  No PND.  No dyspnea on exertion.  No coughing or wheezing.  No recent URIs. CARDIAC:  [name] has a remote history of a heart murmur.  [name] was worked up for this prior to [name] bypass and is reportedly normal. GI:  Negative for GERD.  [name] is on a lot of medicines, but has no symptoms.  No diarrhea, constipation, blood in [name] stool, nausea,  vomiting. GU:  No trouble voiding. GYN:  [name] is on an IUD with renal. EXTREMITIES:  Lower Extremities:  No claudication.  [name] gets some edema after getting up and while [name] goes down. MUSCULOSKELETAL:  Negative.  CURRENT MEDICATIONS: 1. Nexium 40 mg b.i.d. 2. Carafate 1 grams q.i.d. 3. Vyvanse 140 mg daily. 4. Celexa 40 mg daily. 5. Wellbutrin SR 150 mg daily. 6. Trazodone 50 mg q.h.s. 7. Multivitamin one daily.  ALLERGIES:  None.  PHYSICAL EXAMINATION:  GENERAL:  This is a well-nourished, thin white female, in no acute distress currently, but [name] has a fair amount of pain requiring fair amount of Dilaudid and fentanyl. VITAL SIGNS:  Temperature is 98.2, heart rate is 109, blood pressure is 150/87, respiratory rate is 20, saturations  are 100% on nasal cannula. HEAD:  Normocephalic. EYES, EARS, NOSE AND THROAT:  Grossly within normal limits.  Trachea is in the midline. NECK:  No bruits.  No JVD.  No thyromegaly. CHEST:  Clear to auscultation. CARDIAC:  Normal S1 and S2.  No murmurs heard.   Pulses are +2 and equal in upper and lower extremities. ABDOMEN:  No bowel sounds.  Abdomen is currently not distended on palpation.  [name] says [name] is tender in the midline, but [name] is not, especially uncomfortable with palpation.  Hernias:  None.  Masses: None.  Abscess:  None. GU/RECTAL:  Deferred. LYMPHATIC SYSTEM:  Lymphadenopathy none palpated. MUSCULOSKELETAL:  No abnormalities noted. SKIN:  No changes. NEUROLOGIC:  [name] is alert, oriented, cooperative.  Cranial nerves II through XII intact.  No focal deficit. PSYCHIATRIC:  Normal affect.  LABORATORY DATA:  White count of 7.1, hemoglobin is 13, hematocrit 41, platelets 296,000.  Sodium is 140, potassium is 3.8, chloride is 107, CO2 is 24, BUN is 14, creatinine is 0.6, glucose is 101, lipase 35. SGOT/SGPT are 18.  DIAGNOSTIC STUDIES:  Flat plate of the abdomen shows collection of free air.  CT scan final reading is pending, but the GJ junction area has a fair amount of free air and free fluid in the abdomen.  IMPRESSION: 1. 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 2. History of gastric bypass in 2005, Roux-en-Y, [facility]. 3. Diagnostic laparoscopy, lysis of adhesions, laparoscopic omental     patch on October 23, 2009. 4. Attention deficit hyperactivity disorder. 5. Depression.  PLAN:  Studies have been reviewed with radiologist and Dr. Andrey Campanile has been contacted to just come and see the patient now.  I will keep [name] n.p.o., and plan for exploratory laparotomy and repair of perforated viscus.     Eber Hong, P.A.   ______________________________ Velora Heckler, MD    WDJ/MEDQ  D:  02/20/2011  T:  02/20/2011  Job:  [id number]  cc:   Trenton Gammon Fax: [phone]  Electronically Signed by Sherrie George P.A. on 02/22/2011 12:01:34 PM Electronically Signed by Darnell Level MD on 03/14/2011 06:55:54 AM

## 2011-03-18 NOTE — Discharge Summary (Signed)
[name], [name]               ACCOUNT NO.:  [id number]  MEDICAL RECORD NO.:  [id number]           PATIENT TYPE:  I  LOCATION:  1530                         FACILITY:  [facility]  PHYSICIAN:  Mary Sella. Andrey Campanile, MD     DATE OF BIRTH:  26-Jun-1976  DATE OF ADMISSION:  02/20/2011 DATE OF DISCHARGE:  02/27/2011                              DISCHARGE SUMMARY   ADMISSION DIAGNOSIS:  Pneumoperitoneum, probable gastrojejunostomy perforation.  DISCHARGE DIAGNOSES: 1. Pneumoperitoneum with gastrojejunostomy perforation. 2. 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 Peri Jefferson. 3. Perforation of gastrojejunal anastomosis in October 22, 2009,     secondary to gastric ulcer. 4. History of depression. 5. Attention deficit hyperactivity disorder. 6. Irritable bowel syndrome. 7. Hypertension. 8. Gastric ulcer. 9. History of laparoscopic cholecystectomy.  PROCEDURES: 1. CT scan of the abdomen and pelvis, March 23, 2011, pneumoperitoneum     with multiple locules of extraluminal gas in left upper quadrant. 2. Diagnostic laparoscopy, laparoscopic omental patch repair of the GJ     anastomosis perforation, EGD by Dr. Janee Morn and placement of 44-     French round drain, February 20, 2011, Dr. Andrey Campanile and Janee Morn. 3. Upper GI with contrast, February 24, 2011.  This showed no leaks from     the stomach or the proximal small bowel with free flow into the     small bowel.  BRIEF HISTORY:  The patient is a 35 year old white female who presented to the ER after developing abdominal pain.  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  She woke up around 12 and had some abdominal pain, initially thought it was gas.  It became significantly worse by 2 o'clock, it was 10/10.  She was treated in the ER with a fair amount of fentanyl and Dilaudid for pain control.  Flat plate of the abdomen showed some free air.  She has subsequently undergone CT  scan of the abdomen which shows significant free air around the gastrojejunostomy anastomosis.   It was radiology's opinion that if she had had a recurrence of her perforated GJ anastomosis, her distal anastomosis attack, and we saw the patient in consultation.  Past medical and surgical history as above.  MEDICATIONS ON ADMISSION:  Included Nexium 40 mg b.i.d., Carafate 1 g q.i.d., Vyvanse  140 mg daily, Celexa 40 mg daily, Wellbutrin SR 150 mg daily, trazodone 50 mg h.s. and multivitamin one daily.  ALLERGIES:  None.  HOSPITAL COURSE:  The patient was seen in the ER by Dr. Andrey Campanile.  She was taken that evening to the OR and underwent procedures described above. She tolerated it well.  The procedure actually finished February 20, 2011 also.  At surgery, she was found to have a perforated ulcer in the crotch of the Roux limb adjacent to the GJ anastomosis.  The patient tolerated the procedure well and returned to the floor.  First postoperative day, she was doing well with Dilaudid pump for pain. White count was elevated at 19,000.  Electrolytes were normal.  She was started  on Lovenox.  Second postoperative day, she was still n.p.o. and scheduled for upper GI with flow upper GI study for Monday.  The study was performed on Monday and it was normal as noted above.  She was started on clear liquids.  Her diet was advanced.  She was passing gas and doing well.  The incisions looked good.  She was transitioned to her p.o. meds rapidly after surgery.  Her biggest problem was ongoing abdominal discomfort secondary to constipation.  She had actually used Dulcolax suppository at her request postop on 3 different occasions without success.  We gave her dose of milk of magnesia, & ordered a soap suds enema on February 27, 2011 and she has now successfully had a bowel movement.    She is on full diet.  She is feeling much better and is anxious to go home.  We will plan to discharge her later in the  afternoon on February 27, 2011.  DISCHARGE MEDS:  The only new prescription will be her Vicodin 5/325 one to two q.4 h. p.r.n.  She will resume her Carafate 1 g q.i.d., Celexa 40 mg daily, multivitamin 1 daily, Nexium 40 mg daily, trazodone 50 mg h.s., Tylenol p.r.n., Vyvanse 70 mg 2 daily, and Wellbutrin 150 mg SR 1 daily.  DISCHARGE LABS:  On February 24, 2011 shows white count of 4000, hemoglobin 11, hematocrit 35, platelets are 256,000.  Last mag was 1.7.  BNP was 136, K was 3.7, chloride 103, CO2 is 29, BUN 4, creatinine 0.5, glucose 85.  FOLLOWUP:  She will see Dr. Andrey Campanile on March 06, 2011, at 10:00 a.m. for a followup and have her remaining staples removed.  CONDITION ON DISCHARGE:  Improved.     Eber Hong, P.A.   ______________________________ Mary Sella. Andrey Campanile, MD    WDJ/MEDQ  D:  02/27/2011  T:  02/27/2011  Job:  [id number]  cc:   Dr. Karin Lieu D. Elnoria Howard, MD Fax: [phone]  Electronically Signed by Sherrie George P.A. on 03/05/2011 05:42:47 PM Electronically Signed by Gaynelle Adu M.D. on 03/18/2011 08:04:09 AM

## 2011-04-13 LAB — RPR: RPR: NONREACTIVE

## 2011-04-13 LAB — ABO/RH: RH Type: POSITIVE

## 2011-05-06 ENCOUNTER — Encounter: Payer: 59 | Attending: General Surgery | Admitting: *Deleted

## 2011-05-06 DIAGNOSIS — Z01818 Encounter for other preprocedural examination: Secondary | ICD-10-CM | POA: Insufficient documentation

## 2011-05-06 DIAGNOSIS — Z713 Dietary counseling and surveillance: Secondary | ICD-10-CM | POA: Insufficient documentation

## 2011-05-16 ENCOUNTER — Ambulatory Visit (HOSPITAL_COMMUNITY)
Admission: RE | Admit: 2011-05-16 | Discharge: 2011-05-16 | Disposition: A | Discharge status: Home / Self Care | Payer: 59 | Source: Ambulatory Visit | Attending: Gastroenterology | Admitting: Gastroenterology

## 2011-05-16 DIAGNOSIS — O99891 Other specified diseases and conditions complicating pregnancy: Secondary | ICD-10-CM | POA: Insufficient documentation

## 2011-05-16 DIAGNOSIS — Z9884 Bariatric surgery status: Secondary | ICD-10-CM | POA: Insufficient documentation

## 2011-05-16 DIAGNOSIS — R1013 Epigastric pain: Secondary | ICD-10-CM | POA: Insufficient documentation

## 2011-07-07 ENCOUNTER — Emergency Department (HOSPITAL_COMMUNITY)
Admission: EM | Admit: 2011-07-07 | Discharge: 2011-07-07 | Disposition: A | Discharge status: Home / Self Care | Payer: 59 | Attending: Emergency Medicine | Admitting: Emergency Medicine

## 2011-07-07 DIAGNOSIS — Z9884 Bariatric surgery status: Secondary | ICD-10-CM | POA: Insufficient documentation

## 2011-07-07 DIAGNOSIS — F341 Dysthymic disorder: Secondary | ICD-10-CM | POA: Insufficient documentation

## 2011-07-07 DIAGNOSIS — K460 Unspecified abdominal hernia with obstruction, without gangrene: Secondary | ICD-10-CM | POA: Insufficient documentation

## 2011-07-07 DIAGNOSIS — O99891 Other specified diseases and conditions complicating pregnancy: Secondary | ICD-10-CM | POA: Insufficient documentation

## 2011-07-07 DIAGNOSIS — Z8711 Personal history of peptic ulcer disease: Secondary | ICD-10-CM | POA: Insufficient documentation

## 2011-07-08 ENCOUNTER — Encounter (INDEPENDENT_AMBULATORY_CARE_PROVIDER_SITE_OTHER): Payer: Self-pay | Admitting: General Surgery

## 2011-07-08 ENCOUNTER — Ambulatory Visit (INDEPENDENT_AMBULATORY_CARE_PROVIDER_SITE_OTHER): Payer: Self-pay | Admitting: General Surgery

## 2011-07-08 VITALS — BP 123/80 | HR 126 | Ht 67.0 in | Wt 176.0 lb

## 2011-07-08 DIAGNOSIS — K430 Incisional hernia with obstruction, without gangrene: Secondary | ICD-10-CM

## 2011-07-08 NOTE — Progress Notes (Signed)
Subjective:     Patient ID: [name], female   DOB: 03-20-1976, 35 y.o.   MRN: [id number]  HPI This is a 35 year old Caucasian female, referred by the [facility] for evaluation of a incisional hernia.  The patient has had a laparoscopic cholecystectomy, a Roux-en-Y gastric bypass, a perforation at her gastrojejunal anastomosis due to ulcer. She developed a second perforation of her gastrojejunal anastomosis and underwent laparoscopic repair by Dr. Gaynelle Adu on February 20, 2011. She is recovered from that surgery and is back to work as a Horticulturist, commercial. She has seen Dr. Daphine Deutscher and Dr. Andrey Campanile and she is considering revision of her gastrojejunal anastomosis to prevent future perforation.  She has known that she had a hernia at her of umbilicus and supra-umbilical area trocar sites for about four weeks. She is now [redacted] weeks pregnant. Yesterday she developed severe pain and nonreducible incarceration of her hernia. She had to go to the muscle emergency room, receiving narcotics, and be forcibly reduced according to her. Those records are pending.  She feels fine today but called and made an appointment to see first available Dr. Her obstetrician is Dr. Ellyn Hack. Her gastroenterologist is Dr. Elnoria Howard. Her primary care physician is Dr. Susann Givens.  Past Medical History  Diagnosis Date  . ADHD (attention deficit hyperactivity disorder)   . Depression   . GERD (gastroesophageal reflux disease)   . IBS (irritable bowel syndrome)    Current Outpatient Prescriptions  Medication Sig Dispense Refill  . acetaminophen (TYLENOL) 100 MG/ML solution Take 10 mg/kg by mouth every 4 (four) hours as needed.        Marland Kitchen buPROPion (WELLBUTRIN XL) 150 MG 24 hr tablet Take 150 mg by mouth daily.        . citalopram (CELEXA) 40 MG tablet Take 40 mg by mouth daily.        Marland Kitchen esomeprazole (NEXIUM) 40 MG capsule Take 40 mg by mouth daily before breakfast.        . Lisdexamfetamine Dimesylate (VYVANSE  PO) Take 140 mg by mouth daily.        . Prenatal MV-Min-Fe Fum-FA-DHA (PRENATAL 1 PO) Take by mouth daily.        . sucralfate (CARAFATE) 1 G tablet Take 1 g by mouth 4 (four) times daily.        . traZODone (DESYREL) 50 MG tablet Take 50 mg by mouth at bedtime.         No Known Allergies  Family History  Problem Relation Age of Onset  . COPD Father   . Diabetes Paternal Grandmother     History  Substance Use Topics  . Smoking status: Never Smoker   . Smokeless tobacco: Not on file  . Alcohol Use: No      Review of Systems 10 system review of systems is performed and is negative except as described above.    Objective:   Physical Exam  Constitutional: She is oriented to person, place, and time. She appears well-developed and well-nourished. No distress.  HENT:  Head: Normocephalic and atraumatic.  Mouth/Throat: No oropharyngeal exudate.  Eyes: Conjunctivae are normal. Pupils are equal, round, and reactive to light. No scleral icterus.  Neck: Neck supple. No JVD present. No tracheal deviation present. No thyromegaly present.  Cardiovascular: Normal rate, regular rhythm, normal heart sounds and intact distal pulses.  Exam reveals no gallop.   No murmur heard. Pulmonary/Chest: No respiratory distress. She has no wheezes. She has no rales.  Abdominal: Soft. Bowel sounds are normal. She exhibits distension. She exhibits no mass. There is no tenderness. There is no rebound and no guarding.    Lymphadenopathy:    She has no cervical adenopathy.  Neurological: She is alert and oriented to person, place, and time. No cranial nerve deficit. She exhibits abnormal muscle tone.  Skin: Skin is warm and dry. No rash noted. She is not diaphoretic. No erythema. No pallor.  Psychiatric: She has a normal mood and affect. Her behavior is normal. Judgment and thought content normal.       Assessment:     Incarcerated incisional hernia above the umbilicus. Recent episode of severe pain  and  incarceration, now reduced. No evidence of bowel obstruction currently.  History Roux-en-Y gastric bypass.  History perforation of gastrojejunal anastomosis x2 requiring surgery.  History of laparoscopic cholecystectomy.  History depression.  History of attention deficit disorder.  History irritable bowel syndrome.  History hypertension.  History gastric ulcer.    Plan:     We had a long discussion about the pros and cons of surgical repair of her hernia during her pregnancy versus waiting until she is postpartum. She is aware that there are risks and benefits to either approach.  Because she had an episode of severe incarceration, she is at risk of this happening again during the pregnancy.  She is referred immediately to her obstetrician, Dr. Ellyn Hack, to discuss whether or not she should have this surgery during her pregnancy, and if so when it should be done during her gestation.  If Dr. Ellyn Hack thinks that she should have the hernia repaired during her pregnancy, she will call the office and make an appointment with either Dr. Andrey Campanile or with me.  If Dr. Craige Cotta thinks that we should wait, then she will call us postpartum for elective office visit.  We will wait to hear her decision.

## 2011-07-08 NOTE — Patient Instructions (Signed)
Please see your obstetrician immediately to discuss whether or not it would be safe to repair your incisional hernia during the pregnancy. If they think that we should proceed with the surgical repair, please call to make an appointment with either Dr. Andrey Campanile or with me. If she thinks we should wait then make an appointment to see [place] post partum.

## 2011-07-09 ENCOUNTER — Telehealth (INDEPENDENT_AMBULATORY_CARE_PROVIDER_SITE_OTHER): Payer: Self-pay | Admitting: General Surgery

## 2011-07-10 ENCOUNTER — Telehealth (INDEPENDENT_AMBULATORY_CARE_PROVIDER_SITE_OTHER): Payer: Self-pay | Admitting: General Surgery

## 2011-07-10 NOTE — Telephone Encounter (Signed)
I had a phone conversation with Dr. Casey Burkitt today. We've discussed her pregnancy and her ventral incisional hernia and the recent incarceration. Both Dr. Ellyn Hack and I are more comfortable proceeding with open repair of the ventral hernia, possibly with mesh at [facility] in the near future so that it can be done during her second trimester. Dr. Ellyn Hack states that she will be happy to see the patient at [facility] and that  the [facility] nurses will monitor her preop and postop. We will also ask for an anesthesia consult preop. Dr. Ellyn Hack states that the patient does not need to be operated on at [facility]. We will contact the patient with this decision.

## 2011-07-29 ENCOUNTER — Encounter (INDEPENDENT_AMBULATORY_CARE_PROVIDER_SITE_OTHER): Payer: Self-pay | Admitting: General Surgery

## 2011-07-29 ENCOUNTER — Ambulatory Visit (HOSPITAL_COMMUNITY): Admission: RE | Admit: 2011-07-29 | Payer: 59 | Source: Ambulatory Visit | Admitting: General Surgery

## 2011-07-30 ENCOUNTER — Encounter (INDEPENDENT_AMBULATORY_CARE_PROVIDER_SITE_OTHER): Payer: Self-pay | Admitting: General Surgery

## 2011-07-30 ENCOUNTER — Ambulatory Visit (INDEPENDENT_AMBULATORY_CARE_PROVIDER_SITE_OTHER): Payer: Commercial Managed Care - PPO | Admitting: General Surgery

## 2011-07-30 DIAGNOSIS — K432 Incisional hernia without obstruction or gangrene: Secondary | ICD-10-CM | POA: Insufficient documentation

## 2011-07-30 DIAGNOSIS — Z9884 Bariatric surgery status: Secondary | ICD-10-CM

## 2011-07-30 NOTE — Patient Instructions (Signed)
Call or go to ED for symptoms of incarceration or strangulation regarding your hernia

## 2011-07-30 NOTE — Progress Notes (Signed)
Chief complaint: Followup  History of present illness: 35 year old Caucasian female who is [redacted] weeks pregnant who has undergone a laparoscopic Roux-en-Y gastric bypass at [facility] several years ago. Her postoperative course has been complicated by 2 perforations at her gastrojejunal anastomosis secondary to perforated ulcers. She has undergone 2 laparoscopic repairs of her perforated gastrojejunal anastomosis with Cheree Ditto patches  35 year old Caucasian female here for long-term followup. I last saw her on Apr 03, 2011. She most recently saw my partner Dr. Claud Kelp for an incarcerated incisional hernia at one of her trocar sites at her umbilicus. She actually presented to the emergency room with an incarcerated small incisional hernia. They were able to reduce it in the emergency room. Dr. Derrell Lolling had spoken with her obstetrician and they decided that it might be best if she underwent repair during her second trimester of her pregnancy. The surgery was actually scheduled for yesterday however the patient had to cancel surgery because she had no remaining FMLA time. She states that she would've lost her job.  Now she would like to coordinate the hernia repair with her C-section in December. Her estimated delivery date is December 16. She is currently at 25 weeks. She still has occasional problems with the ventral hernia. However she is unable to reduce it herself. She denies any fevers or chills nausea or vomiting. She reports bowel movements every other day. She reports a good appetite. She states that she's been having some bloating and gas pain within an hour of eating. She states that her pregnancy is going well and that her obstetrician is happy with the growth of the baby. She is still taking her Nexium, Carafate, and some occasional TUMS. She is also taking a daily multivitamin and calcium.  Past medical, surgical, family, and social histories are reviewed and unchanged  Review of systems:  Comprehensive 10 point review of systems was performed all systems are negative except what is mentioned in the history of present illness  Physical exam: Well-developed well-nourished Caucasian female in no apparent distress HEENT: Atraumatic normocephalic, pupils equal, no scleral icterus, trachea midline, no lymphadenopathy, no thyromegaly Pulmonary: Lungs are clear to auscultation Cardiac: Regular rate and rhythm Abdomen: Soft, nontender, nondistended. Gravid uterus below the level of the umbilicus. Small incisional hernia in the super umbilical area at a previous trocar site. The defect is approximately 2-3 cm. It is reducible. It is nontender. Skin: No jaundice, rash, or edema Musculoskeletal: no joint deformity Psychiatric: Alert and oriented, appropriate  Data reviewed: I reviewed Dr. Jacinto Halim office notes  Assessment and plan: Status post laparoscopic Roux-en-Y gastric bypass with gastrojejunal anastomotic ulcer with 2 perforations on previous occasions status post laparoscopic repair  Intrauterine pregnancy at 25 weeks Gastroesophageal reflux disease Incisional hernia  I think she is doing well with respect to her gastric bypass. I've encouraged her to continue to take the Nexium and Carafate as well as her multivitamin and calcium. She has met with the nutritionist and appears to be getting enough protein in her diet.  With respect to the small incisional hernia around the level of her umbilicus, we discussed the signs and symptoms of incarceration and strangulation of what should she do should these occur. It is possible her hernia may get larger as her pregnancy progresses. I think we can try to coordinate a combo C-section and hernia repair. I'll see her back toward the end of November so that we can coordinate this with Dr. Ellyn Hack.

## 2011-08-26 ENCOUNTER — Telehealth (INDEPENDENT_AMBULATORY_CARE_PROVIDER_SITE_OTHER): Payer: Self-pay | Admitting: General Surgery

## 2011-08-27 ENCOUNTER — Encounter (HOSPITAL_COMMUNITY): Payer: Commercial Managed Care - PPO | Admitting: Physician Assistant

## 2011-08-27 DIAGNOSIS — F988 Other specified behavioral and emotional disorders with onset usually occurring in childhood and adolescence: Secondary | ICD-10-CM

## 2011-08-27 DIAGNOSIS — F3289 Other specified depressive episodes: Secondary | ICD-10-CM

## 2011-08-27 DIAGNOSIS — F329 Major depressive disorder, single episode, unspecified: Secondary | ICD-10-CM

## 2011-09-03 ENCOUNTER — Telehealth (INDEPENDENT_AMBULATORY_CARE_PROVIDER_SITE_OTHER): Payer: Self-pay | Admitting: General Surgery

## 2011-09-03 NOTE — Telephone Encounter (Signed)
Pt has symptomatic small incisional hernia from one of her previous trocar sites & is currently pregnant.  Plan is to coordinate hernia repair with her c/s which is tentatively planned 12/12.  I explained to the pt that I am post call that day & that it would probably be better if we ask one of the other [facility] surgeons she has seen in the past if they are available for a coordinated case that day. She was actually scheduled for Dr Derrell Lolling to repair the hernia a few weeks ago but it was cancelled since she had no remaining FMLA paperwork.  I will ask our schedulers to check with Drs Derrell Lolling & Daphine Deutscher.  If no one is available, we may have to look into changing her planned c/s date to another day that week.  Pt agreed with the plan.

## 2011-10-03 ENCOUNTER — Other Ambulatory Visit (INDEPENDENT_AMBULATORY_CARE_PROVIDER_SITE_OTHER): Payer: Self-pay | Admitting: General Surgery

## 2011-10-10 ENCOUNTER — Ambulatory Visit (INDEPENDENT_AMBULATORY_CARE_PROVIDER_SITE_OTHER): Payer: Commercial Managed Care - PPO | Admitting: General Surgery

## 2011-10-10 ENCOUNTER — Encounter (INDEPENDENT_AMBULATORY_CARE_PROVIDER_SITE_OTHER): Payer: Self-pay | Admitting: General Surgery

## 2011-10-10 VITALS — BP 122/82 | HR 60 | Temp 98.2°F | Resp 18 | Ht 67.0 in | Wt 211.4 lb

## 2011-10-10 DIAGNOSIS — K432 Incisional hernia without obstruction or gangrene: Secondary | ICD-10-CM

## 2011-10-10 NOTE — Patient Instructions (Signed)
Hernia, Surgical Repair A hernia occurs when an internal organ pushes out through a weak spot in the belly (abdominal) wall muscles. Hernias commonly occur in the groin and around the navel. Hernias often can be pushed back into place (reduced). Most hernias tend to get worse over time. Problems occur when abdominal contents get stuck in the opening (incarcerated hernia). The blood supply gets cut off (strangulated hernia). This is an emergency and needs surgery. Otherwise, hernia repair can be an elective procedure. This means you can schedule this at your convenience when an emergency is not present. Because complications can occur, if you decide to repair the hernia, it is best to do it soon. When it becomes an emergency procedure, there is increased risk of complications after surgery. CAUSES   Heavy lifting.   Obesity.   Prolonged coughing.   Straining to move your bowels.   Hernias can also occur through a cut (incision) by a surgeonafter an abdominal operation.  HOME CARE INSTRUCTIONS Before the repair:  Bed rest is not required. You may continue your normal activities, but avoid heavy lifting (more than 10 pounds) or straining. Cough gently. If you are a smoker, it is best to stop. Even the best hernia repair can break down with the continual strain of coughing.   Do not wear anything tight over your hernia. Do not try to keep it in with an outside bandage or truss. These can damage abdominal contents if they are trapped in the hernia sac.   Eat a normal diet. Avoid constipation. Straining over long periods of time to have a bowel movement will increase hernia size. It also can breakdown repairs. If you cannot do this with diet alone, laxatives or stool softeners may be used.  PRIOR TO SURGERY, SEEK IMMEDIATE MEDICAL CARE IF: You have problems (symptoms) of a trapped (incarcerated) hernia. Symptoms include:  An oral temperature above 102 F (38.9 C) develops, or as your caregiver  suggests.   Increasing abdominal pain.   Feeling sick to your stomach(nausea) and vomiting.   You stop passing gas or stool.   The hernia is stuck outside the abdomen, looks discolored, feels hard, or is tender.   You have any changes in your bowel habits or in the hernia that is unusual for you.  LET YOUR CAREGIVERS KNOW ABOUT THE FOLLOWING:  Allergies.   Medications taken including herbs, eye drops, over the counter medications, and creams.   Use of steroids (by mouth or creams).   Family or personal history of problems with anesthetics or Novocaine.   Possibility of pregnancy, if this applies.   Personal history of blood clots (thrombophlebitis).   Family or personal history of bleeding or blood problems.   Previous surgery.   Other health problems.  BEFORE THE PROCEDURE You should be present 1 hour prior to your procedure, or as directed by your caregiver.  AFTER THE PROCEDURE After surgery, you will be taken to the recovery area. A nurse will watch and check your progress there. Once you are awake, stable, and taking fluids well, you will be allowed to go home as long as there are no problems. Once home, an ice pack (wrapped in a light towel) applied to your operative site may help with discomfort. It may also keep the swelling down. Do not lift anything heavier than 10 pounds (4.55 kilograms). Take showers not baths. Do not drive while taking narcotics. Follow instructions as suggested by your caregiver.  SEEK IMMEDIATE MEDICAL CARE IF: After   surgery:  There is redness, swelling, or increasing pain in the wound.   There is pus coming from the wound.   There is drainage from a wound lasting longer than 1 day.   An unexplained oral temperature above 102 F (38.9 C) develops.   You notice a foul smell coming from the wound or dressing.   There is a breaking open of a wound (edged not staying together) after the sutures have been removed.   You notice increasing  pain in the shoulders (shoulder strap areas).   You develop dizzy episodes or fainting while standing.   You develop persistent nausea or vomiting.   You develop a rash.   You have difficulty breathing.   You develop any reaction or side effects to medications given.  MAKE SURE YOU:   Understand these instructions.   Will watch your condition.   Will get help right away if you are not doing well or get worse.  Document Released: 05/06/2001 Document Revised: 07/23/2011 Document Reviewed: 03/28/2008 [facility] Patient Information 2012 [place], [place].

## 2011-10-10 NOTE — Progress Notes (Signed)
Chief complaint: Followup - discuss hernia repair  History of present illness: 35 year old Caucasian female who is 1 1/[redacted] weeks pregnant who has undergone a laparoscopic Roux-en-Y gastric bypass at [facility] several years ago. Her postoperative course has been complicated by 2 perforations at her gastrojejunal anastomosis secondary to perforated marginal ulcers. She has undergone 2 laparoscopic repairs of her perforated gastrojejunal anastomosis with Cheree Ditto patches   I last saw her in September, 2012. She saw my partner Dr. Claud Kelp for an incarcerated incisional hernia at one of her trocar sites at her umbilicus. She actually presented to the emergency room with an incarcerated small incisional hernia. They were able to reduce it in the emergency room. Dr. Derrell Lolling had spoken with her obstetrician and they decided that it might be best if she underwent repair during her second trimester of her pregnancy. The surgery was actually scheduled for yesterday however the patient had to cancel surgery because she had no remaining FMLA time. She states that she would've lost her job.  Now she would like to coordinate the hernia repair with her C-section in December. Her estimated delivery date is December 16. She is currently at 35.5 weeks. Her hernia symptoms have improved since she her pregnancy has progressed.  She denies any fevers or chills nausea or vomiting. She reports bowel movements every other day. She reports a good appetite. She states that she's been having some bloating and gas pain within an hour of eating. She states that her pregnancy is going well and that her obstetrician is happy with the growth of the baby. She is still taking her Nexium, Carafate, and some occasional TUMS. She is also taking a daily multivitamin and calcium.  Past medical, surgical, family, and social histories are reviewed and unchanged  Review of systems: Comprehensive 10 point review of systems was performed all  systems are negative except what is mentioned in the history of present illness  Physical exam: Well-developed well-nourished Caucasian female in no apparent distress HEENT: Atraumatic normocephalic, pupils equal, no scleral icterus, trachea midline, no lymphadenopathy, no thyromegaly Pulmonary: Lungs are clear to auscultation Cardiac: Regular rate and rhythm Abdomen: Soft, nontender, nondistended. Gravid uterus above the level of the umbilicus. Small incisional hernia in the super umbilical area at a previous trocar site. The defect is approximately 2-3 cm. (difficult exam secondary to gravid uterus). It is nontender. 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 I've encouraged her to continue to take the Nexium and Carafate as well as her multivitamin and calcium. She has met with the nutritionist and appears to be getting enough protein in her diet.  With respect to the small incisional hernia around the level of her umbilicus, we discussed the signs and symptoms of incarceration and strangulation of what should she do should these occur.   We re-discussed options for management of her trocar site incisional hernia: continued observation with repair several months after her pregnancy vs repair during her C-section.  We discussed the pro-cons of each approach, including a some what higher risk for recurrence with proceeding with hernia repair during her c/s.   I described  the procedure in detail.  The patient was given Programmer, multimedia. We discussed the risks and benefits including but not limited to bleeding, infection, chronic abdominal wall pain, nerve entrapment, hernia recurrence, mesh complications, hematoma formation, urinary retention, injury to surrounding structures,  blood clots, and anesthesia risk. We also discussed the typical post operative recovery course, including no heavy lifting for 6 weeks. I explained that the likelihood of improvement of their symptoms is good.   The Current anesthetic plan for her C-section is an epidural. This should be sufficient for my portion of the procedure as well. However I did caution the patient that she may require general anesthesia if I had a difficult time closing her abdominal wall defect. She understands this.  PLAN: OPEN INCISIONAL HERNIA REPAIR WITH POSSIBLE MESH IMPLANTATION at the time of her c/s.    We also discussed the possibility of her requiring a c/s or going into labor before her planned date for surgery. I explained that it would be very challenging to coordinate her hernia repair if she required an earlier c/s. She understands this.

## 2011-10-11 ENCOUNTER — Inpatient Hospital Stay (HOSPITAL_COMMUNITY)
Admission: AD | Admit: 2011-10-11 | Discharge: 2011-10-11 | Disposition: A | Discharge status: Home / Self Care | Payer: 59 | Source: Ambulatory Visit | Attending: Obstetrics and Gynecology | Admitting: Obstetrics and Gynecology

## 2011-10-11 ENCOUNTER — Encounter (HOSPITAL_COMMUNITY): Payer: Self-pay | Admitting: *Deleted

## 2011-10-11 DIAGNOSIS — E860 Dehydration: Secondary | ICD-10-CM | POA: Insufficient documentation

## 2011-10-11 DIAGNOSIS — D649 Anemia, unspecified: Secondary | ICD-10-CM

## 2011-10-11 DIAGNOSIS — O4703 False labor before 37 completed weeks of gestation, third trimester: Secondary | ICD-10-CM

## 2011-10-11 DIAGNOSIS — O99019 Anemia complicating pregnancy, unspecified trimester: Secondary | ICD-10-CM | POA: Insufficient documentation

## 2011-10-11 DIAGNOSIS — O4700 False labor before 37 completed weeks of gestation, unspecified trimester: Secondary | ICD-10-CM

## 2011-10-11 HISTORY — DX: Reserved for concepts with insufficient information to code with codable children: IMO0002

## 2011-10-11 LAB — URINALYSIS, ROUTINE W REFLEX MICROSCOPIC
Bilirubin Urine: NEGATIVE
Leukocytes, UA: NEGATIVE
Nitrite: NEGATIVE
Specific Gravity, Urine: >1.03 — ABNORMAL HIGH (ref 1.005–1.030)
pH: 6 (ref 5.0–8.0)

## 2011-10-11 LAB — COMPREHENSIVE METABOLIC PANEL WITH GFR
ALT: 12 U/L (ref 0–35)
AST: 14 U/L (ref 0–37)
Albumin: 2 g/dL — ABNORMAL LOW (ref 3.5–5.2)
Alkaline Phosphatase: 153 U/L — ABNORMAL HIGH (ref 39–117)
BUN: 16 mg/dL (ref 6–23)
CO2: 21 meq/L (ref 19–32)
Calcium: 8.3 mg/dL — ABNORMAL LOW (ref 8.4–10.5)
Chloride: 104 meq/L (ref 96–112)
Creatinine, Ser: 0.59 mg/dL (ref 0.50–1.10)
GFR calc Af Amer: >90 mL/min
GFR calc non Af Amer: >90 mL/min
Glucose, Bld: 78 mg/dL (ref 70–99)
Potassium: 4.2 meq/L (ref 3.5–5.1)
Sodium: 133 meq/L — ABNORMAL LOW (ref 135–145)
Total Bilirubin: 0.2 mg/dL — ABNORMAL LOW (ref 0.3–1.2)
Total Protein: 5.1 g/dL — ABNORMAL LOW (ref 6.0–8.3)

## 2011-10-11 LAB — LACTATE DEHYDROGENASE: LDH: 150 U/L (ref 94–250)

## 2011-10-11 LAB — CBC
HCT: 26.1 % — ABNORMAL LOW (ref 36.0–46.0)
Hemoglobin: 7.9 g/dL — ABNORMAL LOW (ref 12.0–15.0)
MCH: 23 pg — ABNORMAL LOW (ref 26.0–34.0)
MCHC: 30.3 g/dL (ref 30.0–36.0)
MCV: 76.1 fL — ABNORMAL LOW (ref 78.0–100.0)
Platelets: 230 K/uL (ref 150–400)
RBC: 3.43 MIL/uL — ABNORMAL LOW (ref 3.87–5.11)
RDW: 16.6 % — ABNORMAL HIGH (ref 11.5–15.5)
WBC: 9.5 K/uL (ref 4.0–10.5)

## 2011-10-11 LAB — URIC ACID: Uric Acid, Serum: 3.4 mg/dL (ref 2.4–7.0)

## 2011-10-11 NOTE — ED Provider Notes (Signed)
History     Chief Complaint  Patient presents with  . Contractions   HPI [name] 35 y.o. 35w 6d was at work today.  Began having contractions and feeling more pressure than usual.  Came to MAU for evaluation.   OB History    Grav Para Term Preterm Abortions TAB SAB Ect Mult Living   2 1 1       1       Past Medical History  Diagnosis Date  . ADHD (attention deficit hyperactivity disorder)   . Depression   . GERD (gastroesophageal reflux disease)   . IBS (irritable bowel syndrome)   . Incisional hernia   . Ulcer     Past Surgical History  Procedure Date  . Laparoscopic gastrotomy w/ repair of ulcer 10/23/09  . Roux-en-y gastric bypass 2005  . Cholecystectomy 2006  . Lap repair gastrojejunal anastomotic perforation 02/21/11    Dr [name]    Family History  Problem Relation Age of Onset  . COPD Father   . Diabetes Paternal Grandmother     History  Substance Use Topics  . Smoking status: Never Smoker   . Smokeless tobacco: Never Used  . Alcohol Use: No    Allergies: No Known Allergies  Prescriptions prior to admission  Medication Sig Dispense Refill  . acetaminophen (TYLENOL) 500 MG tablet Take 1,000 mg by mouth every 6 (six) hours as needed. pain       . buPROPion (WELLBUTRIN XL) 150 MG 24 hr tablet Take 150 mg by mouth daily.        . citalopram (CELEXA) 40 MG tablet Take 40 mg by mouth daily.        [name] Sodium (COLACE PO) Take 100 mg by mouth daily as needed. constipation      . esomeprazole (NEXIUM) 40 MG capsule Take 40 mg by mouth 2 (two) times daily.       . IRON PO Take 1 tablet by mouth daily.       . Lisdexamfetamine Dimesylate (VYVANSE PO) Take 140 mg by mouth daily.       . prenatal vitamin w/FE, FA (PRENATAL 1 + 1) 27-1 MG TABS Take 1 tablet by mouth daily.        . traZODone (DESYREL) 50 MG tablet Take 50 mg by mouth at bedtime.         Review of Systems  Gastrointestinal: Negative for abdominal pain.  Genitourinary: Negative for  dysuria.       Contractions   Physical Exam   Blood pressure 135/85, pulse 74, temperature 98.7 F (37.1 C), temperature source Oral, resp. rate 18, height 5\' 7"  (1.702 m), weight 213 lb (96.616 kg).  Physical Exam  Nursing note and vitals reviewed. Constitutional: She is oriented to person, place, and time. She appears well-developed and well-nourished.       BP elevated 144/96 but after resting in bed, now >140/90  HENT:  Head: Normocephalic.  Eyes: EOM are normal.  Neck: Neck supple.  GI: Soft.       FHT 135, reactive strip Contractions 4-10 minutes.  Genitourinary:       RN did cervical exam.  2 cm - unchanged from exam yesterday.  Musculoskeletal: Normal range of motion.       Trace ankle edema bilaterally  Neurological: She is alert and oriented to person, place, and time.  Skin: Skin is warm and dry.  Psychiatric: She has a normal mood and affect.    MAU  Course  Procedures  MDM Results for orders placed during the hospital encounter of 10/11/11 (from the past 24 hour(s))  URINALYSIS, ROUTINE W REFLEX MICROSCOPIC     Status: Abnormal   Collection Time   10/11/11  3:40 PM      Component Value Range   Color, Urine YELLOW  YELLOW    Appearance CLEAR  CLEAR    Specific Gravity, Urine >1.030 (*) 1.005 - 1.030    pH 6.0  5.0 - 8.0    Glucose, UA NEGATIVE  NEGATIVE (mg/dL)   Hgb urine dipstick NEGATIVE  NEGATIVE    Bilirubin Urine NEGATIVE  NEGATIVE    Ketones, ur 15 (*) NEGATIVE (mg/dL)   Protein, ur NEGATIVE  NEGATIVE (mg/dL)   Urobilinogen, UA 0.2  0.0 - 1.0 (mg/dL)   Nitrite NEGATIVE  NEGATIVE    Leukocytes, UA NEGATIVE  NEGATIVE   LACTATE DEHYDROGENASE     Status: Normal   Collection Time   10/11/11  3:48 PM      Component Value Range   LD 150  94 - 250 (U/L)  URIC ACID     Status: Normal   Collection Time   10/11/11  3:48 PM      Component Value Range   Uric Acid, Serum 3.4  2.4 - 7.0 (mg/dL)  CBC     Status: Abnormal   Collection Time   10/11/11   3:48 PM      Component Value Range   WBC 9.5  4.0 - 10.5 (K/uL)   RBC 3.43 (*) 3.87 - 5.11 (MIL/uL)   Hemoglobin 7.9 (*) 12.0 - 15.0 (g/dL)   HCT 78.2 (*) 95.6 - 46.0 (%)   MCV 76.1 (*) 78.0 - 100.0 (fL)   MCH 23.0 (*) 26.0 - 34.0 (pg)   MCHC 30.3  30.0 - 36.0 (g/dL)   RDW 21.3 (*) 08.6 - 15.5 (%)   Platelets 230  150 - 400 (K/uL)  COMPREHENSIVE METABOLIC PANEL     Status: Abnormal   Collection Time   10/11/11  3:48 PM      Component Value Range   Sodium 133 (*) 135 - 145 (mEq/L)   Potassium 4.2  3.5 - 5.1 (mEq/L)   Chloride 104  96 - 112 (mEq/L)   CO2 21  19 - 32 (mEq/L)   Glucose, Bld 78  70 - 99 (mg/dL)   BUN 16  6 - 23 (mg/dL)   Creatinine, Ser 5.78  0.50 - 1.10 (mg/dL)   Calcium 8.3 (*) 8.4 - 10.5 (mg/dL)   Total Protein 5.1 (*) 6.0 - 8.3 (g/dL)   Albumin 2.0 (*) 3.5 - 5.2 (g/dL)   AST 14  0 - 37 (U/L)   ALT 12  0 - 35 (U/L)   Alkaline Phosphatase 153 (*) 39 - 117 (U/L)   Total Bilirubin 0.2 (*) 0.3 - 1.2 (mg/dL)   GFR calc non Af Amer >90  >90 (mL/min)   GFR calc Af Amer >90  >90 (mL/min)   Consult with [name] No urine protein   Assessment and Plan  Not in labor PIH labs normal BP now normal Mild dehydration Anemia  Plan Will send home for rest and hydration Drink at least 8 8-oz glasses of water every day.   [name] 10/11/2011, 4:45 PM   Christine Bernheim, NP 10/11/11 1651  Christine Bernheim, NP 10/11/11 1655

## 2011-10-11 NOTE — Progress Notes (Signed)
Pt reports having ctx q 4-6 min and increase pelvic pressure. Denies SROM or bleeding. Reports good fetal movement.

## 2011-10-13 ENCOUNTER — Other Ambulatory Visit (HOSPITAL_COMMUNITY): Payer: Self-pay | Admitting: Physician Assistant

## 2011-10-14 ENCOUNTER — Encounter: Payer: Self-pay | Admitting: Family Medicine

## 2011-10-15 ENCOUNTER — Other Ambulatory Visit: Payer: Self-pay | Admitting: Obstetrics and Gynecology

## 2011-10-15 ENCOUNTER — Encounter (HOSPITAL_COMMUNITY): Payer: Self-pay

## 2011-10-15 ENCOUNTER — Inpatient Hospital Stay (HOSPITAL_COMMUNITY): Payer: 59 | Admitting: Anesthesiology

## 2011-10-15 ENCOUNTER — Inpatient Hospital Stay (HOSPITAL_COMMUNITY)
Admission: AD | Admit: 2011-10-15 | Discharge: 2011-10-17 | DRG: 775 | Disposition: A | Discharge status: Home / Self Care | Payer: 59 | Source: Ambulatory Visit | Attending: Obstetrics and Gynecology | Admitting: Obstetrics and Gynecology

## 2011-10-15 ENCOUNTER — Encounter (HOSPITAL_COMMUNITY): Payer: Self-pay | Admitting: Anesthesiology

## 2011-10-15 DIAGNOSIS — Z22330 Carrier of Group B streptococcus: Secondary | ICD-10-CM

## 2011-10-15 DIAGNOSIS — K439 Ventral hernia without obstruction or gangrene: Secondary | ICD-10-CM | POA: Diagnosis present

## 2011-10-15 DIAGNOSIS — O09529 Supervision of elderly multigravida, unspecified trimester: Secondary | ICD-10-CM | POA: Diagnosis present

## 2011-10-15 DIAGNOSIS — O99892 Other specified diseases and conditions complicating childbirth: Secondary | ICD-10-CM | POA: Diagnosis present

## 2011-10-15 DIAGNOSIS — O99844 Bariatric surgery status complicating childbirth: Secondary | ICD-10-CM | POA: Diagnosis present

## 2011-10-15 LAB — CBC
HCT: 27.3 % — ABNORMAL LOW (ref 36.0–46.0)
Hemoglobin: 8.2 g/dL — ABNORMAL LOW (ref 12.0–15.0)
MCV: 75.6 fL — ABNORMAL LOW (ref 78.0–100.0)
Platelets: 225 10*3/uL (ref 150–400)
RBC: 3.61 MIL/uL — ABNORMAL LOW (ref 3.87–5.11)
WBC: 8.9 10*3/uL (ref 4.0–10.5)

## 2011-10-15 LAB — SYPHILIS: RPR W/REFLEX TO RPR TITER AND TREPONEMAL ANTIBODIES, TRADITIONAL SCREENING AND DIAGNOSIS ALGORITHM: RPR Ser Ql: NONREACTIVE

## 2011-10-15 MED ORDER — CITALOPRAM HYDROBROMIDE 40 MG PO TABS
40.0000 mg | ORAL_TABLET | Freq: Every day | ORAL | Status: AC
  Administered 2011-10-15 – 2011-10-17 (×3): 40 mg via ORAL
  Filled 2011-10-15 (×3): qty 1

## 2011-10-15 MED ORDER — LISDEXAMFETAMINE DIMESYLATE 70 MG PO CAPS: 140.0000 mg | ORAL_CAPSULE | Freq: Every day | ORAL | Status: DC

## 2011-10-15 MED ORDER — OXYCODONE-ACETAMINOPHEN 5-325 MG PO TABS
1.0000 | ORAL_TABLET | ORAL | Status: DC | PRN
  Administered 2011-10-15: 1 via ORAL
  Filled 2011-10-15: qty 1

## 2011-10-15 MED ORDER — FERROUS SULFATE 325 (65 FE) MG PO TABS
325.0000 mg | ORAL_TABLET | Freq: Every day | ORAL | Status: DC
  Administered 2011-10-16 – 2011-10-17 (×2): 325 mg via ORAL
  Filled 2011-10-15 (×2): qty 1

## 2011-10-15 MED ORDER — ONDANSETRON HCL 4 MG/2ML IJ SOLN: 4.0000 mg | INTRAMUSCULAR | Status: DC | PRN

## 2011-10-15 MED ORDER — TERBUTALINE SULFATE 1 MG/ML IJ SOLN: 0.2500 mg | Freq: Once | INTRAMUSCULAR | Status: DC | PRN

## 2011-10-15 MED ORDER — FLEET ENEMA 7-19 GM/118ML RE ENEM: 1.0000 | ENEMA | RECTAL | Status: DC | PRN

## 2011-10-15 MED ORDER — ONDANSETRON HCL 4 MG PO TABS: 4.0000 mg | ORAL_TABLET | ORAL | Status: DC | PRN

## 2011-10-15 MED ORDER — BENZOCAINE-MENTHOL 20-0.5 % EX AERO
1.0000 "application " | INHALATION_SPRAY | CUTANEOUS | Status: DC | PRN
  Administered 2011-10-15: 1 via TOPICAL

## 2011-10-15 MED ORDER — CITRIC ACID-SODIUM CITRATE 334-500 MG/5ML PO SOLN: 30.0000 mL | ORAL | Status: DC | PRN

## 2011-10-15 MED ORDER — EPHEDRINE 5 MG/ML INJ
10.0000 mg | INTRAVENOUS | Status: DC | PRN
  Filled 2011-10-15: qty 4

## 2011-10-15 MED ORDER — LACTATED RINGERS IV SOLN: 500.0000 mL | INTRAVENOUS | Status: DC | PRN

## 2011-10-15 MED ORDER — PENICILLIN G POTASSIUM 5000000 UNITS IJ SOLR
2.5000 10*6.[IU] | INTRAVENOUS | Status: DC
  Administered 2011-10-15: 2.5 10*6.[IU] via INTRAVENOUS
  Filled 2011-10-15 (×3): qty 2.5

## 2011-10-15 MED ORDER — ONDANSETRON HCL 4 MG/2ML IJ SOLN: 4.0000 mg | Freq: Four times a day (QID) | INTRAMUSCULAR | Status: DC | PRN

## 2011-10-15 MED ORDER — TRAZODONE HCL 50 MG PO TABS
50.0000 mg | ORAL_TABLET | Freq: Every day | ORAL | Status: DC
  Filled 2011-10-15 (×2): qty 1

## 2011-10-15 MED ORDER — LACTATED RINGERS IV SOLN: INTRAVENOUS | Status: DC

## 2011-10-15 MED ORDER — MEASLES, MUMPS & RUBELLA VAC ~~LOC~~ INJ: 0.5000 mL | INJECTION | Freq: Once | SUBCUTANEOUS | Status: DC

## 2011-10-15 MED ORDER — PENICILLIN G POTASSIUM 5000000 UNITS IJ SOLR
5.0000 10*6.[IU] | Freq: Once | INTRAVENOUS | Status: AC
  Administered 2011-10-15: 5 10*6.[IU] via INTRAVENOUS
  Filled 2011-10-15: qty 5

## 2011-10-15 MED ORDER — OXYCODONE-ACETAMINOPHEN 5-325 MG PO TABS
1.0000 | ORAL_TABLET | ORAL | Status: DC | PRN
Start: 2011-10-15 — End: 2011-10-17
  Administered 2011-10-15: 1 via ORAL
  Administered 2011-10-15: 2 via ORAL
  Administered 2011-10-16: 1 via ORAL
  Administered 2011-10-16 (×2): 2 via ORAL
  Filled 2011-10-15 (×3): qty 2
  Filled 2011-10-15 (×2): qty 1

## 2011-10-15 MED ORDER — OXYTOCIN 20 UNITS IN LACTATED RINGERS INFUSION - SIMPLE
125.0000 mL/h | Freq: Once | INTRAVENOUS | Status: AC
  Administered 2011-10-15: 999 mL/h via INTRAVENOUS

## 2011-10-15 MED ORDER — OXYTOCIN 20 UNITS IN LACTATED RINGERS INFUSION - SIMPLE
1.0000 m[IU]/min | INTRAVENOUS | Status: DC
  Administered 2011-10-15: 1 m[IU]/min via INTRAVENOUS

## 2011-10-15 MED ORDER — ZOLPIDEM TARTRATE 5 MG PO TABS: 5.0000 mg | ORAL_TABLET | Freq: Every evening | ORAL | Status: DC | PRN

## 2011-10-15 MED ORDER — PRENATAL PLUS 27-1 MG PO TABS
1.0000 | ORAL_TABLET | Freq: Every day | ORAL | Status: DC
  Administered 2011-10-15: 1 via ORAL
  Filled 2011-10-15 (×3): qty 1

## 2011-10-15 MED ORDER — DIBUCAINE 1 % RE OINT: 1.0000 "application " | TOPICAL_OINTMENT | RECTAL | Status: DC | PRN

## 2011-10-15 MED ORDER — FENTANYL 2.5 MCG/ML BUPIVACAINE 1/10 % EPIDURAL INFUSION (WH - ANES)
INTRAMUSCULAR | Status: DC | PRN
  Administered 2011-10-15: 14 mL/h via EPIDURAL

## 2011-10-15 MED ORDER — BENZOCAINE-MENTHOL 20-0.5 % EX AERO
INHALATION_SPRAY | CUTANEOUS | Status: AC
  Administered 2011-10-15: 1 via TOPICAL
  Filled 2011-10-15: qty 56

## 2011-10-15 MED ORDER — LACTATED RINGERS IV SOLN
500.0000 mL | Freq: Once | INTRAVENOUS | Status: AC
  Administered 2011-10-15: 500 mL via INTRAVENOUS

## 2011-10-15 MED ORDER — DIPHENHYDRAMINE HCL 50 MG/ML IJ SOLN: 12.5000 mg | INTRAMUSCULAR | Status: DC | PRN

## 2011-10-15 MED ORDER — DIPHENHYDRAMINE HCL 25 MG PO CAPS: 25.0000 mg | ORAL_CAPSULE | Freq: Four times a day (QID) | ORAL | Status: DC | PRN

## 2011-10-15 MED ORDER — PHENYLEPHRINE 40 MCG/ML (10ML) SYRINGE FOR IV PUSH (FOR BLOOD PRESSURE SUPPORT)
80.0000 ug | PREFILLED_SYRINGE | INTRAVENOUS | Status: DC | PRN
  Filled 2011-10-15: qty 5

## 2011-10-15 MED ORDER — OXYTOCIN 20 UNITS IN LACTATED RINGERS INFUSION - SIMPLE: 125.0000 mL/h | INTRAVENOUS | Status: AC

## 2011-10-15 MED ORDER — PANTOPRAZOLE SODIUM 40 MG PO TBEC
40.0000 mg | DELAYED_RELEASE_TABLET | Freq: Every day | ORAL | Status: DC
  Administered 2011-10-15 – 2011-10-17 (×3): 40 mg via ORAL
  Filled 2011-10-15 (×3): qty 1

## 2011-10-15 MED ORDER — OXYCODONE-ACETAMINOPHEN 5-325 MG PO TABS
2.0000 | ORAL_TABLET | ORAL | Status: DC | PRN
  Administered 2011-10-15: 2 via ORAL
  Filled 2011-10-15: qty 2

## 2011-10-15 MED ORDER — PHENYLEPHRINE 40 MCG/ML (10ML) SYRINGE FOR IV PUSH (FOR BLOOD PRESSURE SUPPORT): 80.0000 ug | PREFILLED_SYRINGE | INTRAVENOUS | Status: DC | PRN

## 2011-10-15 MED ORDER — LIDOCAINE HCL (PF) 1 % IJ SOLN
30.0000 mL | INTRAMUSCULAR | Status: DC | PRN
  Filled 2011-10-15: qty 30

## 2011-10-15 MED ORDER — SENNOSIDES-DOCUSATE SODIUM 8.6-50 MG PO TABS
2.0000 | ORAL_TABLET | Freq: Every day | ORAL | Status: DC
  Administered 2011-10-16: 2 via ORAL

## 2011-10-15 MED ORDER — LANOLIN HYDROUS EX OINT: TOPICAL_OINTMENT | CUTANEOUS | Status: DC | PRN

## 2011-10-15 MED ORDER — SODIUM BICARBONATE 8.4 % IV SOLN
INTRAVENOUS | Status: DC | PRN
  Administered 2011-10-15: 5 mL via EPIDURAL

## 2011-10-15 MED ORDER — IBUPROFEN 600 MG PO TABS: 600.0000 mg | ORAL_TABLET | Freq: Four times a day (QID) | ORAL | Status: DC

## 2011-10-15 MED ORDER — FENTANYL 2.5 MCG/ML BUPIVACAINE 1/10 % EPIDURAL INFUSION (WH - ANES)
14.0000 mL/h | INTRAMUSCULAR | Status: DC
  Administered 2011-10-15: 14 mL/h via EPIDURAL
  Filled 2011-10-15 (×2): qty 60

## 2011-10-15 MED ORDER — IBUPROFEN 600 MG PO TABS: 600.0000 mg | ORAL_TABLET | Freq: Four times a day (QID) | ORAL | Status: DC | PRN

## 2011-10-15 MED ORDER — EPHEDRINE 5 MG/ML INJ: 10.0000 mg | INTRAVENOUS | Status: DC | PRN

## 2011-10-15 MED ORDER — SIMETHICONE 80 MG PO CHEW: 80.0000 mg | CHEWABLE_TABLET | ORAL | Status: DC | PRN

## 2011-10-15 MED ORDER — BUPROPION HCL ER (XL) 150 MG PO TB24
150.0000 mg | ORAL_TABLET | Freq: Every day | ORAL | Status: DC
  Administered 2011-10-15 – 2011-10-17 (×3): 150 mg via ORAL
  Filled 2011-10-15 (×3): qty 1

## 2011-10-15 MED ORDER — WITCH HAZEL-GLYCERIN EX PADS: 1.0000 "application " | MEDICATED_PAD | CUTANEOUS | Status: DC | PRN

## 2011-10-15 MED ORDER — PRENATAL PLUS 27-1 MG PO TABS
1.0000 | ORAL_TABLET | Freq: Every day | ORAL | Status: DC
  Administered 2011-10-16 – 2011-10-17 (×2): 1 via ORAL

## 2011-10-15 MED ORDER — ACETAMINOPHEN 325 MG PO TABS: 650.0000 mg | ORAL_TABLET | ORAL | Status: DC | PRN

## 2011-10-15 MED ORDER — TETANUS-DIPHTH-ACELL PERTUSSIS 5-2.5-18.5 LF-MCG/0.5 IM SUSP
0.5000 mL | Freq: Once | INTRAMUSCULAR | Status: AC
  Administered 2011-10-16: 0.5 mL via INTRAMUSCULAR
  Filled 2011-10-15: qty 0.5

## 2011-10-15 MED ORDER — OXYTOCIN BOLUS FROM INFUSION
500.0000 mL | Freq: Once | INTRAVENOUS | Status: DC
  Filled 2011-10-15: qty 1000
  Filled 2011-10-15: qty 500

## 2011-10-15 NOTE — Anesthesia Procedure Notes (Signed)

## 2011-10-15 NOTE — Progress Notes (Signed)
Patient ID: [name], female   DOB: September 05, 1976, 35 y.o.   MRN: [id number] Pt progressed to full dilatation and pushed well. She delivered a living female infant 6 pounds 0 ounces LOA over a second degree left of ML laceration Apgars 8 at 1 minute and 9 at 5 minutes The placenta delivered intact and the uterus was normal. A periclitoral laceration was sutured with 3-0 vicryl ebl 500 cc's.

## 2011-10-15 NOTE — Anesthesia Preprocedure Evaluation (Signed)
Anesthesia Evaluation  Patient identified by MRN, date of birth, ID band Patient awake    Reviewed: Allergy & Precautions, H&P , Patient's Chart, lab work & pertinent test results  Airway Mallampati: II TM Distance: >3 FB Neck ROM: full    Dental  (+) Teeth Intact   Pulmonary  clear to auscultation        Cardiovascular regular Normal    Neuro/Psych    GI/Hepatic GERD-  ,  Endo/Other    Renal/GU      Musculoskeletal   Abdominal   Peds  Hematology   Anesthesia Other Findings       Reproductive/Obstetrics (+) Pregnancy                           Anesthesia Physical Anesthesia Plan  ASA: II  Anesthesia Plan: Epidural   Post-op Pain Management:    Induction:   Airway Management Planned:   Additional Equipment:   Intra-op Plan:   Post-operative Plan:   Informed Consent: I have reviewed the patients History and Physical, chart, labs and discussed the procedure including the risks, benefits and alternatives for the proposed anesthesia with the patient or authorized representative who has indicated his/her understanding and acceptance.   Dental Advisory Given  Plan Discussed with:   Anesthesia Plan Comments: (Labs checked- platelets confirmed with RN in room. Fetal heart tracing, per RN, reported to be stable enough for sitting procedure. Discussed epidural, and patient consents to the procedure:  included risk of possible headache,backache, failed block, allergic reaction, and nerve injury. This patient was asked if she had any questions or concerns before the procedure started. )        Anesthesia Quick Evaluation  

## 2011-10-15 NOTE — Progress Notes (Signed)
PT SAY SROM AT 0030-   CLEAR.  UC AFTER  SROM.  VE IN OFFICE - 2 CM.

## 2011-10-15 NOTE — H&P (Signed)
[name], [name]               ACCOUNT NO.:  [id number]  MEDICAL RECORD NO.:  [id number]  LOCATION:  9163                          FACILITY:  [facility]  PHYSICIAN:  Malachi Pro. Ambrose Mantle, M.D. DATE OF BIRTH:  05-30-1976  DATE OF ADMISSION:  10/15/2011 DATE OF DISCHARGE:                             HISTORY & PHYSICAL   PRESENT ILLNESS:  [age]-year-old white female, para 1-0-0-1, gravida 2, EDC November 09, 2011, by an ultrasound at 7-3/[redacted] weeks gestation on Mar 26, 2011.  LABORATORY DATA:  A positive, negative antibody, rubella immune, RPR nonreactive, urine culture negative,  hepatitis B surface antigen negative, HIV negative, GC and Chlamydia negative.  Cystic fibrosis screen declined.  Quad screen negative.  Group B strep positive.  One- hour Glucola 75.  This patient began her prenatal care on Apr 23, 2011.  Her pregnancy was complicated by radiologic procedures done between March 29 and February 24, 2011.  Ultrasound done on June 18, 2011, showed an estimated gestational age of [redacted] weeks and 1 day with an [facility] of November 11, 2011.  Anatomy was normal showing a normal single female fetus.  No apparent abnormalities. She had no significant problems.  Did have some high blood pressure several days ago, but this seems to have resolved, and admission blood pressure is 136/86.  The patient states that she had spontaneous rupture of membranes at home at 12:30 am on the day of admission.  She has begun to contract some and is admitted to the hospital now.  Her past medical history reveals she did have an abnormal Pap smear in 1992, but none since.  She has had ADHD, arthritis, depression, and fibroids on the uterus.  She also has had perforation of a gastric ulcer twice in March 2012 and in 2010.  She has also had irritable bowel syndrome.  SURGICAL HISTORY:  Laparoscopic cholecystectomy in 2006, ganglion cyst excision in 1994, gastric bypass in 2005, gastric ulcer repair in 2010 and 2012,  and wisdom teeth extraction in 1990.  Her medications at the onset of pregnancy were calcium, Carafate, Celexa, Nexium, trazodone, vitamin, Vyvanse, Wellbutrin, and Zofran.  She had no known drug allergies.  FAMILY HISTORY:  Paternal grandfather, father, and brother have alcoholism.  Paternal grandmother with diabetes.  Maternal grandmother and paternal grandfather with heart disease.  Maternal grandmother with MI and maternal grandmother with skin cancer.  The patient denied alcohol, tobacco, and substance abuse.  She has been married since August 2011.  Her occupation is Horticulturist, commercial.  OBSTETRIC HISTORY:  In July 1996 she delivered an 8-pound 2-ounce female infant vaginally in [place], [place].  PHYSICAL EXAMINATION:  VITAL SIGNS:  On admission her blood pressure is 136/86, pulse is 68. HEART:  Normal size and sounds.  No murmurs. LUNGS:  Clear to auscultation. ABDOMEN:  Soft and probably appropriate for [redacted] weeks gestation.  At her last prenatal visit on October 10, 2011, the fundal height was 35 cm at 35-5/[redacted] weeks gestation.  The cervix is 3-4 cm, 70% vertex at -1 station.  ADMITTING IMPRESSION:  Intrauterine pregnancy at 36 weeks and 3 days, history of gastric bypass, history of cesarean section even though the  details of this are sketchy.  The patient has positive group B strep and would be treated with penicillin and observed for progress in labor at the present time, but  she may want to proceed with a repeat C-section.     Malachi Pro. Ambrose Mantle, M.D.     TFH/MEDQ  D:  10/15/2011  T:  10/15/2011  Job:  [id number]

## 2011-10-15 NOTE — Progress Notes (Signed)
Patient ID: [name], female   DOB: [birth year], 35 y.o.   MRN: [id number] Pitocin was begun at 6AM and pt has an epidural. Contractions are q 4-5 minutes. Cervix per the RN exam is 5 cm 90% effaced and the vertex is at -1 station.

## 2011-10-15 NOTE — Progress Notes (Signed)
CALLED COLEY, RN  FOR ROOM ASSIGNMENT - WILL  CALL BACK

## 2011-10-15 NOTE — Anesthesia Postprocedure Evaluation (Signed)
  Anesthesia Post-op Note  Patient: [name]  Procedure(s) Performed: * No procedures listed *  Patient Location: PACU and Mother/Baby  Anesthesia Type: Epidural  Level of Consciousness: awake, alert  and oriented  Airway and Oxygen Therapy: Patient Spontanous Breathing  Post-op Pain: none  Post-op Assessment: Post-op Vital signs reviewed  Post-op Vital Signs: Reviewed and stable  Complications: No apparent anesthesia complications

## 2011-10-15 NOTE — Plan of Care (Signed)
Problem: Consults Goal: Birthing Suites Patient Information Press F2 to bring up selections list Outcome: Completed/Met Date Met:  10/15/11  Pt < [redacted] weeks EGA

## 2011-10-16 LAB — CBC
HCT: 22.9 % — ABNORMAL LOW (ref 36.0–46.0)
MCH: 22.8 pg — ABNORMAL LOW (ref 26.0–34.0)
MCHC: 30.1 g/dL (ref 30.0–36.0)
MCV: 75.6 fL — ABNORMAL LOW (ref 78.0–100.0)
RDW: 17 % — ABNORMAL HIGH (ref 11.5–15.5)

## 2011-10-16 NOTE — Progress Notes (Signed)
CRITICAL VALUE ALERT  Critical value received:  HGB 6.9  Date of notification:  10/16/11  Time of notification:  0545  Critical value read back: yes  Nurse who received alert:  Hart Carwin, RN  MD notified (1st page):  Dr. Jackelyn Knife  Time of first page:  0605  MD notified (2nd page):  Time of second page:  Responding MD:  Dr. Jackelyn Knife  Time MD responded:  [phone]

## 2011-10-16 NOTE — Progress Notes (Signed)
PPD#1 Doing well Afeb, VSS Fundus firm, palpable hernia Hemoglobin & Hematocrit     Component Value Date/Time   HGB 6.9* 10/16/2011 0528   HCT 22.9* 10/16/2011 0528   Hemodynamically stable, no need for transfusion at least for now.  Continue routine postpartum care today.  Will get in touch with her surgeon either tomorrow or early next week to discuss hernia repair.

## 2011-10-17 MED ORDER — OXYCODONE-ACETAMINOPHEN 5-325 MG PO TABS: 1.0000 | ORAL_TABLET | ORAL | Status: DC | PRN

## 2011-10-17 NOTE — Progress Notes (Signed)
PPD#2 Feels better Afeb, VSS Fundus firm, NT at U-0, hernia superior to umbilicus Will d/c home.  Surgeon's office closed today, will call early next week.

## 2011-10-17 NOTE — Discharge Summary (Signed)
Obstetric Discharge Summary Reason for Admission: rupture of membranes Prenatal Procedures: none Intrapartum Procedures: spontaneous vaginal delivery Postpartum Procedures: none Complications-Operative and Postpartum: 2nd degree perineal laceration Hemoglobin  Date Value Range Status  10/16/2011 6.9* 12.0-15.0 (g/dL) Final     REPEATED TO VERIFY     CRITICAL RESULT CALLED TO, READ BACK BY AND VERIFIED WITH:     TONI STANIN RN. @0540  ON 10/16/11 BY CALDWELL T     HCT  Date Value Range Status  10/16/2011 22.9* 36.0-46.0 (%) Final    Discharge Diagnoses: Term Pregnancy-delivered and abdominal wall hernia  Discharge Information: Date: 10/17/2011 Activity: pelvic rest Diet: routine Medications: Percocet Condition: stable Instructions: refer to practice specific booklet Discharge to: home Follow-up Information    Follow up with BOVARD,JODY, MD. Make an appointment in 6 weeks.   Contact information:   [address] [facility] [address] [place] [address] [phone]        Will call surgeon to discuss hernia repair and BTL at the same time  Newborn Data: Live born female  Birth Weight: 6 lb 0.5 oz (2735 g) APGAR: 8, 9  Home with mother.  Fuad Forget D 10/17/2011, 9:39 AM

## 2011-10-22 ENCOUNTER — Ambulatory Visit (INDEPENDENT_AMBULATORY_CARE_PROVIDER_SITE_OTHER): Payer: 59 | Admitting: Physician Assistant

## 2011-10-22 DIAGNOSIS — F988 Other specified behavioral and emotional disorders with onset usually occurring in childhood and adolescence: Secondary | ICD-10-CM

## 2011-10-22 DIAGNOSIS — F900 Attention-deficit hyperactivity disorder, predominantly inattentive type: Secondary | ICD-10-CM

## 2011-10-22 MED ORDER — LISDEXAMFETAMINE DIMESYLATE 70 MG PO CAPS: 70.0000 mg | ORAL_CAPSULE | Freq: Two times a day (BID) | ORAL | Status: DC

## 2011-10-23 NOTE — Progress Notes (Signed)
   [name] Follow-up Outpatient Visit  [name] 1976-05-09  Date: 10/22/11   Subjective: [name] presented today with her newborn son, who she reports was born about 4 weeks early. She reports other than have an some baby blues her mood has been stable. She denies any suicidal or homicidal ideation. She has not been getting good sleep, as would be expected with a newborn child. She has been eating well. She denies any auditory or visual hallucinations. She will be on maternity leave for the next 5 weeks. She has continued to take her medications at on advice from her OB/GYN.  There were no vitals filed for this visit.  Mental Status Examination  Appearance: Casually dressed and well-groomed Alert: Yes Attention: good  Cooperative: Yes Eye Contact: Good Speech: Clear and even Psychomotor Activity: Normal Memory/Concentration: Intact Oriented: person, place, time/date and situation Mood: Euthymic Affect: Congruent Thought Processes and Associations: Linear Fund of Knowledge: Good Thought Content:  Insight: Good Judgement: Good  Diagnosis: ADHD inattentive type, depressive disorder NOS.  Treatment Plan: We will continue the medications as prescribed and [name] will followup in 3 months.  Justine Dines, PA

## 2011-10-25 ENCOUNTER — Inpatient Hospital Stay (HOSPITAL_COMMUNITY)
Admission: AD | Admit: 2011-10-25 | Discharge: 2011-10-25 | Disposition: A | Discharge status: Home / Self Care | Payer: 59 | Source: Ambulatory Visit | Attending: Obstetrics and Gynecology | Admitting: Obstetrics and Gynecology

## 2011-10-25 ENCOUNTER — Encounter (HOSPITAL_COMMUNITY): Payer: Self-pay | Admitting: *Deleted

## 2011-10-25 DIAGNOSIS — I10 Essential (primary) hypertension: Secondary | ICD-10-CM

## 2011-10-25 DIAGNOSIS — O99893 Other specified diseases and conditions complicating puerperium: Secondary | ICD-10-CM | POA: Insufficient documentation

## 2011-10-25 DIAGNOSIS — R030 Elevated blood-pressure reading, without diagnosis of hypertension: Secondary | ICD-10-CM | POA: Insufficient documentation

## 2011-10-25 HISTORY — DX: Anemia, unspecified: D64.9

## 2011-10-25 LAB — URINALYSIS, ROUTINE W REFLEX MICROSCOPIC
Glucose, UA: NEGATIVE mg/dL
Leukocytes, UA: NEGATIVE
Nitrite: NEGATIVE
Protein, ur: NEGATIVE mg/dL

## 2011-10-25 LAB — CBC
MCH: 22.3 pg — ABNORMAL LOW (ref 26.0–34.0)
MCHC: 29.1 g/dL — ABNORMAL LOW (ref 30.0–36.0)
MCV: 76.9 fL — ABNORMAL LOW (ref 78.0–100.0)
Platelets: 417 10*3/uL — ABNORMAL HIGH (ref 150–400)

## 2011-10-25 LAB — COMPREHENSIVE METABOLIC PANEL
ALT: 17 U/L (ref 0–35)
AST: 13 U/L (ref 0–37)
Calcium: 8.7 mg/dL (ref 8.4–10.5)
Creatinine, Ser: 0.65 mg/dL (ref 0.50–1.10)
GFR calc Af Amer: >90 mL/min (ref >90)
Glucose, Bld: 91 mg/dL (ref 70–99)
Sodium: 138 mEq/L (ref 135–145)
Total Protein: 6.3 g/dL (ref 6.0–8.3)

## 2011-10-25 LAB — URIC ACID: Uric Acid, Serum: 3.7 mg/dL (ref 2.4–7.0)

## 2011-10-25 MED ORDER — LABETALOL HCL 100 MG PO TABS: 100.0000 mg | ORAL_TABLET | Freq: Two times a day (BID) | ORAL | Status: DC

## 2011-10-25 NOTE — Progress Notes (Signed)
Pt's BP's 140/90's and labs thus far WNL.  Platelets greater than 400K and no protein in urine.  If CMET WNL will d/c home on labetalol 100mg  ppo BID and see if improves HA to reduce BP.  Pt to return to office in 3-4 days for BP check on meds.

## 2011-10-25 NOTE — Progress Notes (Signed)
Patient states she had a SVD on 11-21. Home health RN got elevated BP yesterday and again today at home was 165/106. Has had a headache for a while, Tylenol was helping until yesterday. Percocet helped a little, not now.

## 2011-10-25 NOTE — ED Notes (Signed)
Dr. Senaida Ores at bedside discussing labs and discharge instructions with pt.

## 2011-10-25 NOTE — ED Provider Notes (Signed)
Chief Complaint:  Hypertension   Christine [name] is  35 y.o. [id number]. }.  She presents complaining of Hypertension Had NSVD without complications 2 weeks ago.  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  Feels like her eyes are "swollen" but no visual changes OB History    Grav Para Term Preterm Abortions TAB SAB Ect Mult Living   2 2 1 1  0 0 0 0 0 2       Past Medical History  Diagnosis Date  . ADHD (attention deficit hyperactivity disorder)   . Depression   . GERD (gastroesophageal reflux disease)   . IBS (irritable bowel syndrome)   . Incisional hernia   . Ulcer     Past Surgical History  Procedure Date  . Laparoscopic gastrotomy w/ repair of ulcer 10/23/09  . Roux-en-y gastric bypass 2005  . Cholecystectomy 2006  . Lap repair gastrojejunal anastomotic perforation 02/21/11    Dr Christine [name]    Family History  Problem Relation Age of Onset  . COPD Father   . Diabetes Paternal Grandmother     History  Substance Use Topics  . Smoking status: Never Smoker   . Smokeless tobacco: Never Used  . Alcohol Use: No    Allergies: No Known Allergies  Prescriptions prior to admission  Medication Sig Dispense Refill  . acetaminophen (TYLENOL) 500 MG tablet Take 1,000 mg by mouth every 6 (six) hours as needed. For pain      . buPROPion (WELLBUTRIN XL) 150 MG 24 hr tablet Take 150 mg by mouth daily.        . citalopram (CELEXA) 40 MG tablet TAKE 1 TABLET BY MOUTH AT BEDTIME  90 tablet  1  . esomeprazole (NEXIUM) 40 MG capsule Take 40 mg by mouth 2 (two) times daily.       . ferrous sulfate 325 (65 FE) MG tablet Take 325 mg by mouth daily with breakfast.        . lisdexamfetamine (VYVANSE) 70 MG capsule Take 1 capsule (70 mg total) by mouth 2 (two) times daily.  180 capsule  0  . oxyCODONE-acetaminophen (PERCOCET) 5-325 MG per tablet Take 1-2 tablets by mouth every 3 (three) hours as needed.  20 tablet   0  . prenatal vitamin w/FE, FA (PRENATAL 1 + 1) 27-1 MG TABS Take 1 tablet by mouth daily.        . traZODone (DESYREL) 50 MG tablet Take 50 mg by mouth at bedtime.         Review of Systems - Negative except aforementioned  Physical Exam   Blood pressure 141/101, pulse 87, temperature 99.6 F (37.6 C), temperature source Oral, resp. rate 20, SpO2 99.00%, unknown if currently breastfeeding. PT is breastfeeding  General: General appearance - alert, well appearing, and in no distress and oriented to person, place, and time Chest - clear to auscultation, no wheezes, rales or rhonchi, symmetric air entry Heart - normal rate and regular rhythm Abdomen - soft, nontender, nondistended, no masses or organomegaly Pelvic - deferred Extremities - peripheral pulses normal, no pedal edema, no clubbing or cyanosis, Homan's sign negative bilaterally, DTR's 2+, no clonus   Labs: Recent Results (from the past 24 hour(s))  URINALYSIS, ROUTINE W REFLEX MICROSCOPIC   Collection Time   10/25/11  3:25 PM      Component Value Range   Color, Urine STRAW (*) YELLOW    APPearance  CLEAR  CLEAR    Specific Gravity, Urine 1.010  1.005 - 1.030    pH 7.0  5.0 - 8.0    Glucose, UA NEGATIVE  NEGATIVE (mg/dL)   Hgb urine dipstick NEGATIVE  NEGATIVE    Bilirubin Urine NEGATIVE  NEGATIVE    Ketones, ur NEGATIVE  NEGATIVE (mg/dL)   Protein, ur NEGATIVE  NEGATIVE (mg/dL)   Urobilinogen, UA 0.2  0.0 - 1.0 (mg/dL)   Nitrite NEGATIVE  NEGATIVE    Leukocytes, UA NEGATIVE  NEGATIVE    Imaging Studies:  No results found.   Assessment: Patient Active Problem List  Diagnoses  . DEPRESSION  . COUGH  . Incisional hernia without mention of obstruction or gangrene  . SVD (spontaneous vaginal delivery)    Plan: Dr. Senaida [name] aware of pts arrival.  Awaiting labs  [name],Christine [name]

## 2011-10-27 ENCOUNTER — Telehealth (INDEPENDENT_AMBULATORY_CARE_PROVIDER_SITE_OTHER): Payer: Self-pay | Admitting: General Surgery

## 2011-10-28 ENCOUNTER — Other Ambulatory Visit (HOSPITAL_COMMUNITY): Payer: 59

## 2011-10-28 NOTE — Telephone Encounter (Signed)
Left message for patient to call back and ask for me 

## 2011-10-30 NOTE — Telephone Encounter (Signed)
Spoke with patient, she did have a vaginal delivery and was hoping to have surgery while on leave from her job and with her baby being small enough to lift. I spoke with Dr Andrey Campanile and due to her belly wall being very lax from stretching from her pregnancy, he does not think this will be a very strong repair. I made patient aware of this and that he would like to wait several months to repair this. At the time her hernia is not bothering her. She will call back when she is ready to get this repaired.

## 2011-11-04 ENCOUNTER — Encounter (HOSPITAL_COMMUNITY): Admission: RE | Payer: Self-pay | Source: Ambulatory Visit

## 2011-11-04 ENCOUNTER — Inpatient Hospital Stay (HOSPITAL_COMMUNITY): Admission: RE | Admit: 2011-11-04 | Payer: 59 | Source: Ambulatory Visit | Admitting: Obstetrics and Gynecology

## 2011-11-04 SURGERY — Surgical Case
Anesthesia: Regional

## 2011-11-27 MED ORDER — CEFAZOLIN SODIUM-DEXTROSE 2-3 GM-% IV SOLR
2.0000 g | INTRAVENOUS | Status: DC
  Filled 2011-11-27: qty 50

## 2011-11-28 ENCOUNTER — Encounter (HOSPITAL_COMMUNITY): Payer: Self-pay | Admitting: *Deleted

## 2011-11-28 ENCOUNTER — Encounter (HOSPITAL_COMMUNITY)
Admission: RE | Disposition: A | Discharge status: Home / Self Care | Payer: Self-pay | Source: Ambulatory Visit | Attending: Gastroenterology

## 2011-11-28 ENCOUNTER — Ambulatory Visit (HOSPITAL_COMMUNITY)
Admission: RE | Admit: 2011-11-28 | Discharge: 2011-11-28 | Disposition: A | Discharge status: Home / Self Care | Payer: 59 | Source: Ambulatory Visit | Attending: Gastroenterology | Admitting: Gastroenterology

## 2011-11-28 DIAGNOSIS — Z79899 Other long term (current) drug therapy: Secondary | ICD-10-CM | POA: Insufficient documentation

## 2011-11-28 DIAGNOSIS — K259 Gastric ulcer, unspecified as acute or chronic, without hemorrhage or perforation: Secondary | ICD-10-CM | POA: Insufficient documentation

## 2011-11-28 DIAGNOSIS — R1013 Epigastric pain: Secondary | ICD-10-CM | POA: Insufficient documentation

## 2011-11-28 DIAGNOSIS — I10 Essential (primary) hypertension: Secondary | ICD-10-CM | POA: Insufficient documentation

## 2011-11-28 HISTORY — PX: ESOPHAGOGASTRODUODENOSCOPY: SHX5428

## 2011-11-28 HISTORY — DX: Essential (primary) hypertension: I10

## 2011-11-28 HISTORY — DX: Headache: R51

## 2011-11-28 SURGERY — EGD (ESOPHAGOGASTRODUODENOSCOPY)
Anesthesia: Moderate Sedation

## 2011-11-28 MED ORDER — SODIUM CHLORIDE 0.9 % IV SOLN
Freq: Once | INTRAVENOUS | Status: AC
  Administered 2011-11-28: 500 mL via INTRAVENOUS

## 2011-11-28 MED ORDER — BUTAMBEN-TETRACAINE-BENZOCAINE 2-2-14 % EX AERO
INHALATION_SPRAY | CUTANEOUS | Status: DC | PRN
  Administered 2011-11-28: 2 via TOPICAL

## 2011-11-28 MED ORDER — MIDAZOLAM HCL 10 MG/2ML IJ SOLN
INTRAMUSCULAR | Status: AC
  Filled 2011-11-28: qty 2

## 2011-11-28 MED ORDER — MIDAZOLAM HCL 10 MG/2ML IJ SOLN
INTRAMUSCULAR | Status: DC | PRN
  Administered 2011-11-28 (×4): 2 mg via INTRAVENOUS

## 2011-11-28 MED ORDER — FENTANYL CITRATE 0.05 MG/ML IJ SOLN
INTRAMUSCULAR | Status: AC
  Filled 2011-11-28: qty 2

## 2011-11-28 MED ORDER — FENTANYL NICU IV SYRINGE 50 MCG/ML
INJECTION | INTRAMUSCULAR | Status: DC | PRN
  Administered 2011-11-28 (×3): 25 ug via INTRAVENOUS

## 2011-11-28 MED ORDER — DIPHENHYDRAMINE HCL 50 MG/ML IJ SOLN
INTRAMUSCULAR | Status: AC
  Filled 2011-11-28: qty 1

## 2011-11-28 NOTE — Op Note (Signed)
[facility] [address] [place], [place]  [address]  OPERATIVE PROCEDURE REPORT  PATIENT:  Annissa, Andreoni  MR#:  [id number] BIRTHDATE:  16-Jan-1976  GENDER:  female ENDOSCOPIST:  Jeani Hawking, MD PROCEDURE DATE:  11/28/2011 PROCEDURE:  EGD, diagnostic [id number] ASA CLASS:  Class II INDICATIONS:  Epigastric pain MEDICATIONS:  Fentanyl 75 mcg IV, Versed 8 mg IV  DESCRIPTION OF PROCEDURE:   After the risks benefits and alternatives of the procedure were thoroughly explained, informed consent was obtained.  The  endoscope was introduced through the mouth and advanced to the anastomosis, without limitations.  The instrument was slowly withdrawn as the mucosa was fully examined. <<PROCEDUREIMAGES>>  FINDINGS:  A 1 cm superfical ulcer was identified at the gastrojejunal anastamosis. I believe the ulcer is at the junction of the efferent liimb. No visible vessel or stigmata of bleeding. In the distal esophagus there was evidence of a mild Candidal esophagitis.    Retroflexion was not performed.  The scope was then withdrawn from the patient and the procedure terminated.  COMPLICATIONS:  None  IMPRESSION:  1) Superficial nonbleeding 1 cm nastamotic ulcer at the efferent limb. RECOMMENDATIONS:  1) Continue with Nexium BID and sucralfate. 2) Follow up with Dr. Andrey Campanile to determine if or when a gastric revision is necessary. 3) The patient is currentl breast feeding and I will avoi treatment with antifungals.  She is not very symptomatic from this issue.  ______________________________ Jeani Hawking, MD  n. Rosalie DoctorJeani Hawking at 11/28/2011 11:01 AM  Alethia Berthold, [phone]

## 2011-11-28 NOTE — H&P (Signed)
  Reason for Consult: Recurrent epigastric pain and history of ulcer perforation s/p Roux-en-Y gastric bypass Referring Physician:   Rosaura Carpenter HPI: [name] is well-known to me and she started to have a recurrence of her epigastric pain.  In the past she has perforated quite rapidly from anastamotic ulcers.  I have a low threshold to perform EGD with her history.  Earlier in the summer when she was starting her second trimester of her pregnancy she underwent an EGD for similar complaints.  No anastamotic ulcers were identified.  Currently she feels better and she is on Nexium BID and sucralfate QID.  There is discussion about revising her gastric bypass with Dr. Gaynelle Adu.  Past Medical History  Diagnosis Date  . ADHD (attention deficit hyperactivity disorder)   . Depression   . GERD (gastroesophageal reflux disease)   . IBS (irritable bowel syndrome)   . Incisional hernia   . Ulcer   . Anemia   . Hypertension   . Headache     Past Surgical History  Procedure Date  . Laparoscopic gastrotomy w/ repair of ulcer 10/23/09  . Roux-en-y gastric bypass 2005  . Cholecystectomy 2006  . Lap repair gastrojejunal anastomotic perforation 02/21/11    Dr Andrey Campanile  . No past surgeries     Family History  Problem Relation Age of Onset  . COPD Father   . Diabetes Paternal Grandmother   . Anesthesia problems Neg Hx   . Hypotension Neg Hx   . Malignant hyperthermia Neg Hx   . Pseudochol deficiency Neg Hx     Social History:  reports that she has never smoked. She has never used smokeless tobacco. She reports that she does not drink alcohol or use illicit drugs.  Allergies: No Known Allergies  Medications:  Scheduled:   . sodium chloride   Intravenous Once  . ceFAZolin (ANCEF) IV  2 g Intravenous 60 min Pre-Op   Continuous:   No results found for this or any previous visit (from the past 24 hour(s)).   No results found.  ROS:  As stated above in the HPI otherwise  negative.  Blood pressure 149/94, temperature 98.1 F (36.7 C), temperature source Oral, resp. rate 11, height 5\' 7"  (1.702 m), weight 81.647 kg (180 lb), SpO2 100.00%, currently breastfeeding.    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  Plan: 1) EGD today.  [name] 11/28/2011, 10:30 AM

## 2011-11-28 NOTE — Interval H&P Note (Signed)
History and Physical Interval Note:  11/28/2011 10:34 AM  [name]  has presented today for surgery, with the diagnosis of abdominal pain  The various methods of treatment have been discussed with the patient and family. After consideration of risks, benefits and other options for treatment, the patient has consented to  Procedure(s): ESOPHAGOGASTRODUODENOSCOPY (EGD) as a surgical intervention .  The patients' history has been reviewed, patient examined, no change in status, stable for surgery.  I have reviewed the patients' chart and labs.  Questions were answered to the patient's satisfaction.     Bentley Haralson D

## 2011-12-01 ENCOUNTER — Encounter (HOSPITAL_COMMUNITY): Payer: Self-pay | Admitting: Gastroenterology

## 2011-12-01 ENCOUNTER — Encounter (HOSPITAL_COMMUNITY): Payer: Self-pay

## 2011-12-02 ENCOUNTER — Telehealth (INDEPENDENT_AMBULATORY_CARE_PROVIDER_SITE_OTHER): Payer: Self-pay | Admitting: General Surgery

## 2011-12-02 NOTE — Telephone Encounter (Signed)
Dr Elnoria Howard called yesterday saying that the pt has a new anastomotic ulcer, appears shallow. Spoke with pt. She feels ok but has a constant discomfort o/w ok.  Spoke briefly about options. rec coming in to discuss about proceeding with Revision of her gastrojejunostomy. She would like to proceed. Have asked Coy Saunas. To get pt an appt with Dr Daphine Deutscher as the primary surgeon and me as the co-surgeon.

## 2011-12-05 ENCOUNTER — Encounter (INDEPENDENT_AMBULATORY_CARE_PROVIDER_SITE_OTHER): Payer: Self-pay | Admitting: Surgery

## 2011-12-05 ENCOUNTER — Ambulatory Visit (INDEPENDENT_AMBULATORY_CARE_PROVIDER_SITE_OTHER): Payer: Self-pay | Admitting: Surgery

## 2011-12-05 DIAGNOSIS — Z9884 Bariatric surgery status: Secondary | ICD-10-CM

## 2011-12-05 NOTE — Progress Notes (Signed)
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 female had a gastric bypass done laparoscopically in [place] in 2005. Since then she has had 2 marginal ulcer perforations. One was taken care of by Dr. Lucretia Field the second last March by Dr. Andrey Campanile. She recently had a baby boy and postop his and found to have a new ulcer for which she is taking Carafate and maximum PPI therapy.  Dr. Andrey Campanile asked me to see her about setting her up for pouch reduction and revision of her gastrojejunostomy. I think we will go on to doing that hopefully laparoscopically although indicated with her 2 per perforations and she may have to have this done open. However also indicate that we may repair her ventral hernia at the same time.  However like to get a H. Pylori study to see if she is positive and if so then I would treat this I think we can wait a little longer before doing her surgery so that she has an adequate amount of FML a time. If it is negative then we may need to quit it and schedule revision sooner.  Past Medical History  Diagnosis Date  . ADHD (attention deficit hyperactivity disorder)   . Depression   . GERD (gastroesophageal reflux disease)   . IBS (irritable bowel syndrome)   . Incisional hernia   . Ulcer   . Anemia   . Hypertension   . Headache     Past Surgical History  Procedure Date  . Laparoscopic gastrotomy w/ repair of ulcer 10/23/09  . Roux-en-y gastric bypass 2005  . Cholecystectomy 2006  . Lap repair gastrojejunal anastomotic perforation 02/21/11    Dr Andrey Campanile  . No past surgeries   . Esophagogastroduodenoscopy 11/28/2011    Procedure: ESOPHAGOGASTRODUODENOSCOPY (EGD);  Surgeon: Theda Belfast, MD;  Location: [facility];  Service: Endoscopy;  Laterality: N/A;    Current Outpatient Prescriptions  Medication Sig Dispense Refill  . acetaminophen (TYLENOL) 500 MG tablet Take 1,000 mg by mouth every  6 (six) hours as needed. For pain      . buPROPion (WELLBUTRIN XL) 150 MG 24 hr tablet Take 150 mg by mouth daily.        . citalopram (CELEXA) 40 MG tablet TAKE 1 TABLET BY MOUTH AT BEDTIME  90 tablet  1  . docusate sodium (COLACE) 100 MG capsule Take 100 mg by mouth daily as needed. constipation       . esomeprazole (NEXIUM) 40 MG capsule Take 40 mg by mouth 2 (two) times daily.       . ferrous sulfate 325 (65 FE) MG tablet Take 325 mg by mouth daily with breakfast.        . labetalol (NORMODYNE) 100 MG tablet Take 1 tablet (100 mg total) by mouth 2 (two) times daily.  60 tablet  1  . lisdexamfetamine (VYVANSE) 70 MG capsule Take 70 mg by mouth 2 (two) times daily.        . prenatal vitamin w/FE, FA (PRENATAL 1 + 1) 27-1 MG TABS Take 1 tablet by mouth daily.        . sucralfate (CARAFATE) 1 G tablet Take 1 g by mouth 4 (four) times daily.      . traZODone (DESYREL) 50 MG tablet Take 50 mg by mouth at bedtime.        Review of patient's allergies indicates no known allergies. Family History  Problem Relation Age of Onset  . COPD Father   . Diabetes Paternal Grandmother   . Anesthesia problems Neg Hx   . Hypotension Neg Hx   . Malignant hyperthermia Neg Hx   . Pseudochol deficiency Neg Hx   . Heart disease Maternal Grandmother    Social History:   reports that she has never smoked. She has never used smokeless tobacco. She reports that she does not drink alcohol or use illicit drugs.   REVIEW OF SYSTEMS - PERTINENT POSITIVES ONLY: Noncontributory   Physical Exam:  Blood pressure 156/92, pulse 88, temperature 98.9 F (37.2 C), temperature source Temporal, resp. rate 18, height 5\' 7"  (1.702 m), weight 184 lb 6.4 oz (83.643 kg). Body mass index is 28.88 kg/(m^2).  Gen:  WDWN white female NAD  Neurological: Alert and oriented to person, place, and time. Motor and sensory function is grossly intact  Head: Normocephalic and atraumatic.  Eyes: Conjunctivae are normal. Pupils are equal,  round, and reactive to light. No scleral icterus.  Neck: Normal range of motion. Neck supple. No tracheal deviation or thyromegaly present.  Cardiovascular:  SR without murmurs or gallops.  No carotid bruits Breast feeding Respiratory: Effort normal.  No respiratory distress. No chest wall tenderness. Breath sounds normal.  No wheezes, rales or rhonchi.  Abdomen:  Ventral hernia GU: Musculoskeletal: Normal range of motion. Extremities are nontender. No cyanosis, edema or clubbing noted Lymphadenopathy: No cervical, preauricular, postauricular or axillary adenopathy is present Skin: Skin is warm and dry. No rash noted. No diaphoresis. No erythema. No pallor. Pscyh: Normal mood and affect. Behavior is normal. Judgment and thought content normal.   LABORATORY RESULTS: No results found for this or any previous visit (from the past 48 hour(s)).  RADIOLOGY RESULTS: No results found.  Problem List: Patient Active Problem List  Diagnoses  . DEPRESSION  . COUGH  . Incisional hernia without mention of obstruction or gangrene  . SVD (spontaneous vaginal delivery)  . 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 Daphine Deutscher, MD, [facility] [phone] beeper [phone]  12/05/2011 3:14 PM

## 2011-12-08 ENCOUNTER — Telehealth (INDEPENDENT_AMBULATORY_CARE_PROVIDER_SITE_OTHER): Payer: Self-pay | Admitting: General Surgery

## 2011-12-08 ENCOUNTER — Other Ambulatory Visit (INDEPENDENT_AMBULATORY_CARE_PROVIDER_SITE_OTHER): Payer: Self-pay | Admitting: General Surgery

## 2011-12-08 ENCOUNTER — Encounter (HOSPITAL_COMMUNITY): Payer: Self-pay | Admitting: Pharmacy Technician

## 2011-12-08 NOTE — Telephone Encounter (Signed)
Albin Felling called from the [facility] and stated that if you please review the orders in outlook they say one thing and in Epic orders are saying another thing. Please call Carla at [phone]. Pt surgery date is 12/22/11 at [facility]

## 2011-12-12 ENCOUNTER — Other Ambulatory Visit (INDEPENDENT_AMBULATORY_CARE_PROVIDER_SITE_OTHER): Payer: Self-pay | Admitting: Surgery

## 2011-12-19 ENCOUNTER — Encounter (HOSPITAL_COMMUNITY): Payer: Self-pay

## 2011-12-19 ENCOUNTER — Encounter (HOSPITAL_COMMUNITY)
Admission: RE | Admit: 2011-12-19 | Discharge: 2011-12-19 | Disposition: A | Discharge status: Home / Self Care | Payer: 59 | Source: Ambulatory Visit | Attending: Surgery | Admitting: Surgery

## 2011-12-19 LAB — COMPREHENSIVE METABOLIC PANEL
ALT: 13 U/L (ref 0–35)
Alkaline Phosphatase: 94 U/L (ref 39–117)
BUN: 16 mg/dL (ref 6–23)
CO2: 27 mEq/L (ref 19–32)
Calcium: 9.1 mg/dL (ref 8.4–10.5)
GFR calc Af Amer: >90 mL/min (ref >90)
GFR calc non Af Amer: >90 mL/min (ref >90)
Glucose, Bld: 78 mg/dL (ref 70–99)
Potassium: 4 mEq/L (ref 3.5–5.1)
Sodium: 139 mEq/L (ref 135–145)

## 2011-12-19 LAB — DIFFERENTIAL
Basophils Absolute: 0.1 10*3/uL (ref 0.0–0.1)
Eosinophils Absolute: 0.1 10*3/uL (ref 0.0–0.7)
Eosinophils Relative: 2 % (ref 0–5)
Neutrophils Relative %: 51 % (ref 43–77)

## 2011-12-19 LAB — CBC
HCT: 30.9 % — ABNORMAL LOW (ref 36.0–46.0)
Hemoglobin: 9.1 g/dL — ABNORMAL LOW (ref 12.0–15.0)
MCH: 21.1 pg — ABNORMAL LOW (ref 26.0–34.0)
RBC: 4.31 MIL/uL (ref 3.87–5.11)

## 2011-12-19 LAB — SURGICAL PCR SCREEN: MRSA, PCR: NEGATIVE

## 2011-12-19 NOTE — Patient Instructions (Addendum)
[address] WIN[address] BODIFORD  12/19/2011   Your procedure is scheduled on: 12-22-2011  Report to [facility] at  0615 AM.  Call this number if you have problems the morning of surgery: [phone]   Remember:   Do not eat food or liquids:After Midnight.    .  Take these medicines the morning of surgery with A SIP OF WATER: wellbutrin, celexa, nexium, vyvanse, labetalol   Do not wear jewelry, make-up.  Do not wear lotions, powders, or perfumes. Do not  wear deodorant.  Do not shave 48 hours prior to surgery.  Do not bring valuables to the hospital.  Contacts, dentures or bridgework may not be worn into surgery.  Leave suitcase in the car. After surgery it may be brought to your room.  For patients admitted to the hospital, checkout time is 11:00 AM the day of discharge.       Special Instructions: CHG Shower Use Special Wash: 1/2 bottle night before surgery and 1/2 bottle morning of surgery.neck down avoid private area   Please read over the following fact sheets that you were given: MRSA Information Wrigley Plasencia rn [facility] pre op nurse phone number [phone]

## 2011-12-19 NOTE — Pre-Procedure Instructions (Signed)
Chest 1 view xray 02-21-2011 epic ekg 02-20-2011 epic

## 2011-12-22 ENCOUNTER — Inpatient Hospital Stay (HOSPITAL_COMMUNITY): Admission: RE | Admit: 2011-12-22 | Payer: 59 | Source: Ambulatory Visit | Admitting: Surgery

## 2011-12-22 ENCOUNTER — Encounter (HOSPITAL_COMMUNITY): Payer: Self-pay | Admitting: Surgery

## 2011-12-22 ENCOUNTER — Encounter (HOSPITAL_COMMUNITY): Payer: Self-pay | Admitting: Anesthesiology

## 2011-12-22 ENCOUNTER — Encounter (HOSPITAL_COMMUNITY): Payer: Self-pay | Admitting: *Deleted

## 2011-12-22 ENCOUNTER — Encounter (HOSPITAL_COMMUNITY): Admission: RE | Payer: Self-pay | Source: Ambulatory Visit

## 2011-12-22 ENCOUNTER — Other Ambulatory Visit (INDEPENDENT_AMBULATORY_CARE_PROVIDER_SITE_OTHER): Payer: Self-pay | Admitting: Surgery

## 2011-12-22 ENCOUNTER — Inpatient Hospital Stay (HOSPITAL_COMMUNITY): Payer: 59 | Admitting: Anesthesiology

## 2011-12-22 ENCOUNTER — Encounter (HOSPITAL_COMMUNITY)
Admission: RE | Disposition: A | Discharge status: Home / Self Care | Payer: Self-pay | Source: Ambulatory Visit | Attending: Surgery

## 2011-12-22 ENCOUNTER — Inpatient Hospital Stay (HOSPITAL_COMMUNITY)
Admission: RE | Admit: 2011-12-22 | Discharge: 2011-12-25 | DRG: 328 | Disposition: A | Discharge status: Home / Self Care | Payer: 59 | Source: Ambulatory Visit | Attending: Surgery | Admitting: Surgery

## 2011-12-22 DIAGNOSIS — Z01812 Encounter for preprocedural laboratory examination: Secondary | ICD-10-CM

## 2011-12-22 DIAGNOSIS — Z9884 Bariatric surgery status: Secondary | ICD-10-CM

## 2011-12-22 DIAGNOSIS — K9509 Other complications of gastric band procedure: Principal | ICD-10-CM | POA: Diagnosis present

## 2011-12-22 DIAGNOSIS — R05 Cough: Secondary | ICD-10-CM | POA: Diagnosis present

## 2011-12-22 DIAGNOSIS — R1013 Epigastric pain: Secondary | ICD-10-CM | POA: Diagnosis not present

## 2011-12-22 DIAGNOSIS — K3189 Other diseases of stomach and duodenum: Secondary | ICD-10-CM | POA: Diagnosis not present

## 2011-12-22 DIAGNOSIS — F329 Major depressive disorder, single episode, unspecified: Secondary | ICD-10-CM | POA: Diagnosis present

## 2011-12-22 DIAGNOSIS — K257 Chronic gastric ulcer without hemorrhage or perforation: Secondary | ICD-10-CM

## 2011-12-22 DIAGNOSIS — Y832 Surgical operation with anastomosis, bypass or graft as the cause of abnormal reaction of the patient, or of later complication, without mention of misadventure at the time of the procedure: Secondary | ICD-10-CM | POA: Diagnosis present

## 2011-12-22 DIAGNOSIS — K219 Gastro-esophageal reflux disease without esophagitis: Secondary | ICD-10-CM | POA: Diagnosis present

## 2011-12-22 DIAGNOSIS — K432 Incisional hernia without obstruction or gangrene: Secondary | ICD-10-CM | POA: Diagnosis present

## 2011-12-22 DIAGNOSIS — K289 Gastrojejunal ulcer, unspecified as acute or chronic, without hemorrhage or perforation: Secondary | ICD-10-CM | POA: Diagnosis present

## 2011-12-22 DIAGNOSIS — I10 Essential (primary) hypertension: Secondary | ICD-10-CM | POA: Diagnosis present

## 2011-12-22 DIAGNOSIS — F909 Attention-deficit hyperactivity disorder, unspecified type: Secondary | ICD-10-CM | POA: Diagnosis present

## 2011-12-22 DIAGNOSIS — R059 Cough, unspecified: Secondary | ICD-10-CM | POA: Diagnosis present

## 2011-12-22 DIAGNOSIS — F3289 Other specified depressive episodes: Secondary | ICD-10-CM | POA: Diagnosis present

## 2011-12-22 HISTORY — PX: VENTRAL HERNIA REPAIR: SHX424

## 2011-12-22 LAB — CBC
HCT: 29.4 % — ABNORMAL LOW (ref 36.0–46.0)
MCHC: 29.9 g/dL — ABNORMAL LOW (ref 30.0–36.0)
MCV: 70.8 fL — ABNORMAL LOW (ref 78.0–100.0)
RDW: 17.2 % — ABNORMAL HIGH (ref 11.5–15.5)

## 2011-12-22 SURGERY — GASTROJEJUNOSTOMY, LAPAROSCOPIC
Anesthesia: General

## 2011-12-22 SURGERY — REVISION, ANASTOMOSIS, GASTROJEJUNAL, LAPAROSCOPIC
Anesthesia: General | Site: Abdomen | Wound class: Clean Contaminated

## 2011-12-22 MED ORDER — 0.9 % SODIUM CHLORIDE (POUR BTL) OPTIME
TOPICAL | Status: DC | PRN
  Administered 2011-12-22: 1000 mL

## 2011-12-22 MED ORDER — MORPHINE SULFATE 2 MG/ML IJ SOLN: 2.0000 mg | INTRAMUSCULAR | Status: DC | PRN

## 2011-12-22 MED ORDER — LACTATED RINGERS IR SOLN
Status: DC | PRN
  Administered 2011-12-22: 13:00:00
  Administered 2011-12-22: 3000 mL

## 2011-12-22 MED ORDER — PROMETHAZINE HCL 25 MG/ML IJ SOLN
6.2500 mg | INTRAMUSCULAR | Status: AC | PRN
  Administered 2011-12-22 (×2): 6.25 mg via INTRAVENOUS

## 2011-12-22 MED ORDER — ROCURONIUM BROMIDE 100 MG/10ML IV SOLN
INTRAVENOUS | Status: DC | PRN
  Administered 2011-12-22: 40 mg via INTRAVENOUS
  Administered 2011-12-22: 20 mg via INTRAVENOUS
  Administered 2011-12-22: 10 mg via INTRAVENOUS
  Administered 2011-12-22: 20 mg via INTRAVENOUS

## 2011-12-22 MED ORDER — HYDROMORPHONE HCL PF 1 MG/ML IJ SOLN
0.5000 mg | INTRAMUSCULAR | Status: DC | PRN
  Administered 2011-12-22 – 2011-12-25 (×22): 1 mg via INTRAVENOUS
  Filled 2011-12-22 (×22): qty 1

## 2011-12-22 MED ORDER — ACETAMINOPHEN 10 MG/ML IV SOLN
INTRAVENOUS | Status: DC | PRN
  Administered 2011-12-22: 1000 mg via INTRAVENOUS

## 2011-12-22 MED ORDER — ONDANSETRON HCL 4 MG/2ML IJ SOLN
INTRAMUSCULAR | Status: DC | PRN
  Administered 2011-12-22: 4 mg via INTRAVENOUS

## 2011-12-22 MED ORDER — KETOROLAC TROMETHAMINE 30 MG/ML IJ SOLN: 15.0000 mg | Freq: Once | INTRAMUSCULAR | Status: DC | PRN

## 2011-12-22 MED ORDER — BUPIVACAINE LIPOSOME 1.3 % IJ SUSP
20.0000 mL | Freq: Once | INTRAMUSCULAR | Status: AC
  Administered 2011-12-22: 20 mL
  Filled 2011-12-22: qty 20

## 2011-12-22 MED ORDER — SUCCINYLCHOLINE CHLORIDE 20 MG/ML IJ SOLN
INTRAMUSCULAR | Status: DC | PRN
  Administered 2011-12-22: 100 mg via INTRAVENOUS

## 2011-12-22 MED ORDER — PROPOFOL 10 MG/ML IV EMUL
INTRAVENOUS | Status: DC | PRN
  Administered 2011-12-22: 150 mg via INTRAVENOUS

## 2011-12-22 MED ORDER — MIDAZOLAM HCL 5 MG/5ML IJ SOLN
INTRAMUSCULAR | Status: DC | PRN
  Administered 2011-12-22: 2 mg via INTRAVENOUS

## 2011-12-22 MED ORDER — ACETAMINOPHEN 160 MG/5ML PO SOLN: 650.0000 mg | ORAL | Status: DC | PRN

## 2011-12-22 MED ORDER — ONDANSETRON HCL 4 MG/2ML IJ SOLN
4.0000 mg | INTRAMUSCULAR | Status: DC | PRN
  Administered 2011-12-23: 4 mg via INTRAVENOUS
  Filled 2011-12-22: qty 2

## 2011-12-22 MED ORDER — LIDOCAINE HCL (CARDIAC) 20 MG/ML IV SOLN
INTRAVENOUS | Status: DC | PRN
  Administered 2011-12-22: 60 mg via INTRAVENOUS

## 2011-12-22 MED ORDER — NEOSTIGMINE METHYLSULFATE 1 MG/ML IJ SOLN
INTRAMUSCULAR | Status: DC | PRN
  Administered 2011-12-22: 4 mg via INTRAVENOUS

## 2011-12-22 MED ORDER — OXYCODONE-ACETAMINOPHEN 5-325 MG/5ML PO SOLN
5.0000 mL | ORAL | Status: DC | PRN
  Administered 2011-12-25: 10 mL via ORAL
  Filled 2011-12-22: qty 10

## 2011-12-22 MED ORDER — HEPARIN SODIUM (PORCINE) 5000 UNIT/ML IJ SOLN
5000.0000 [IU] | INTRAMUSCULAR | Status: AC
  Administered 2011-12-22: 5000 [IU] via SUBCUTANEOUS

## 2011-12-22 MED ORDER — DROPERIDOL 2.5 MG/ML IJ SOLN
INTRAMUSCULAR | Status: DC | PRN
  Administered 2011-12-22: 0.625 mg via INTRAVENOUS

## 2011-12-22 MED ORDER — FIBRIN SEALANT COMPONENT 5 ML EX KIT
PACK | CUTANEOUS | Status: DC | PRN
  Administered 2011-12-22: 5 mL

## 2011-12-22 MED ORDER — FENTANYL CITRATE 0.05 MG/ML IJ SOLN: 25.0000 ug | INTRAMUSCULAR | Status: DC | PRN

## 2011-12-22 MED ORDER — FENTANYL CITRATE 0.05 MG/ML IJ SOLN
INTRAMUSCULAR | Status: DC | PRN
  Administered 2011-12-22 (×2): 50 ug via INTRAVENOUS
  Administered 2011-12-22: 100 ug via INTRAVENOUS
  Administered 2011-12-22: 50 ug via INTRAVENOUS

## 2011-12-22 MED ORDER — UNJURY CHOCOLATE CLASSIC POWDER
2.0000 [oz_av] | Freq: Four times a day (QID) | ORAL | Status: DC
  Administered 2011-12-24 – 2011-12-25 (×3): 2 [oz_av] via ORAL

## 2011-12-22 MED ORDER — HEPARIN SODIUM (PORCINE) 5000 UNIT/ML IJ SOLN
INTRAMUSCULAR | Status: AC
  Filled 2011-12-22: qty 1

## 2011-12-22 MED ORDER — PHENYLEPHRINE HCL 10 MG/ML IJ SOLN
INTRAMUSCULAR | Status: DC | PRN
  Administered 2011-12-22 (×2): 40 ug via INTRAVENOUS

## 2011-12-22 MED ORDER — HYDROMORPHONE HCL PF 1 MG/ML IJ SOLN
0.2500 mg | INTRAMUSCULAR | Status: DC | PRN
  Administered 2011-12-22 (×2): 0.5 mg via INTRAVENOUS

## 2011-12-22 MED ORDER — HEPARIN SODIUM (PORCINE) 5000 UNIT/ML IJ SOLN
5000.0000 [IU] | Freq: Three times a day (TID) | INTRAMUSCULAR | Status: DC
  Administered 2011-12-22: 5000 [IU] via SUBCUTANEOUS
  Filled 2011-12-22 (×11): qty 1

## 2011-12-22 MED ORDER — UNJURY VANILLA POWDER
2.0000 [oz_av] | Freq: Four times a day (QID) | ORAL | Status: DC
  Administered 2011-12-24: 2 [oz_av] via ORAL

## 2011-12-22 MED ORDER — LACTATED RINGERS IV SOLN
INTRAVENOUS | Status: DC | PRN
  Administered 2011-12-22 (×3): via INTRAVENOUS

## 2011-12-22 MED ORDER — HYDROMORPHONE HCL PF 1 MG/ML IJ SOLN
INTRAMUSCULAR | Status: DC | PRN
  Administered 2011-12-22: 1 mg via INTRAVENOUS

## 2011-12-22 MED ORDER — DEXAMETHASONE SODIUM PHOSPHATE 10 MG/ML IJ SOLN
INTRAMUSCULAR | Status: DC | PRN
  Administered 2011-12-22: 10 mg via INTRAVENOUS

## 2011-12-22 MED ORDER — GLYCOPYRROLATE 0.2 MG/ML IJ SOLN
INTRAMUSCULAR | Status: DC | PRN
  Administered 2011-12-22: .6 mg via INTRAVENOUS

## 2011-12-22 MED ORDER — DEXTROSE 5 % IV SOLN
2.0000 g | INTRAVENOUS | Status: AC
  Administered 2011-12-22: 2 g via INTRAVENOUS
  Filled 2011-12-22: qty 2

## 2011-12-22 MED ORDER — SODIUM CHLORIDE 0.9 % IV SOLN
10.0000 mg | INTRAVENOUS | Status: DC | PRN
  Administered 2011-12-22: 40 ug/min via INTRAVENOUS

## 2011-12-22 MED ORDER — UNJURY CHICKEN SOUP POWDER
2.0000 [oz_av] | Freq: Four times a day (QID) | ORAL | Status: DC
  Administered 2011-12-24: 2 [oz_av] via ORAL

## 2011-12-22 SURGICAL SUPPLY — 78 items
APL SKNCLS STERI-STRIP NONHPOA (GAUZE/BANDAGES/DRESSINGS) ×2
APL SRG 32X5 SNPLK LF DISP (MISCELLANEOUS)
APPLICATOR COTTON TIP 6IN STRL (MISCELLANEOUS) ×4 IMPLANT
BAG SPEC RTRVL LRG 6X4 10 (ENDOMECHANICALS) ×2
BENZOIN TINCTURE PRP APPL 2/3 (GAUZE/BANDAGES/DRESSINGS) ×1 IMPLANT
BLADE SURG 15 STRL LF DISP TIS (BLADE) ×2 IMPLANT
BLADE SURG 15 STRL SS (BLADE) ×3
CABLE HIGH FREQUENCY MONO STRZ (ELECTRODE) ×1 IMPLANT
CANISTER SUCTION 2500CC (MISCELLANEOUS) ×3 IMPLANT
CLIP SUT LAPRA TY ABSORB (SUTURE) ×1 IMPLANT
CLOSURE STERI STRIP 1/2 X4 (GAUZE/BANDAGES/DRESSINGS) ×1 IMPLANT
CLOTH BEACON ORANGE TIMEOUT ST (SAFETY) ×3 IMPLANT
COVER SURGICAL LIGHT HANDLE (MISCELLANEOUS) ×2 IMPLANT
DEVICE SUTURE ENDOST 10MM (ENDOMECHANICALS) ×1 IMPLANT
DEVICE TROCAR PUNCTURE CLOSURE (ENDOMECHANICALS) ×1 IMPLANT
DISSECTOR BLUNT TIP ENDO 5MM (MISCELLANEOUS) ×2 IMPLANT
DRAIN PENROSE 18X1/4 LTX STRL (WOUND CARE) ×2 IMPLANT
DRAPE CAMERA CLOSED 9X96 (DRAPES) ×3 IMPLANT
ELECT CAUTERY BLADE 6.4 (BLADE) ×1 IMPLANT
GAUZE SPONGE 4X4 16PLY XRAY LF (GAUZE/BANDAGES/DRESSINGS) ×3 IMPLANT
GLOVE BIOGEL M 8.0 STRL (GLOVE) ×3 IMPLANT
GOWN STRL NON-REIN LRG LVL3 (GOWN DISPOSABLE) ×3 IMPLANT
GOWN STRL REIN XL XLG (GOWN DISPOSABLE) ×6 IMPLANT
HANDLE STAPLE  ENDO EGIA 4 STD (STAPLE) ×1
HANDLE STAPLE EGIA 4 XL (STAPLE) ×3 IMPLANT
HANDLE STAPLE ENDO EGIA 4 STD (STAPLE) IMPLANT
KIT BASIN OR (CUSTOM PROCEDURE TRAY) ×3 IMPLANT
KIT GASTRIC LAVAGE 34FR ADT (SET/KITS/TRAYS/PACK) ×3 IMPLANT
NDL SPNL 22GX3.5 QUINCKE BK (NEEDLE) ×2 IMPLANT
NEEDLE SPNL 22GX3.5 QUINCKE BK (NEEDLE) ×3 IMPLANT
NS IRRIG 1000ML POUR BTL (IV SOLUTION) ×3 IMPLANT
PACK GENERAL/GYN (CUSTOM PROCEDURE TRAY) ×2 IMPLANT
PEN SKIN MARKING BROAD (MISCELLANEOUS) ×3 IMPLANT
PENCIL BUTTON HOLSTER BLD 10FT (ELECTRODE) ×1 IMPLANT
POUCH SPECIMEN RETRIEVAL 10MM (ENDOMECHANICALS) ×1 IMPLANT
RELOAD EGIA 45 MED/THCK PURPLE (STAPLE) ×1 IMPLANT
RELOAD EGIA 45 TAN VASC (STAPLE) IMPLANT
RELOAD EGIA 60 MED/THCK PURPLE (STAPLE) ×6 IMPLANT
RELOAD EGIA 60 TAN VASC (STAPLE) ×1 IMPLANT
RELOAD ENDO STITCH 2.0 (ENDOMECHANICALS) ×12
RELOAD STAPLE 60 MED/THCK ART (STAPLE) IMPLANT
RELOAD SUT SNGL STCH ABSRB 2-0 (ENDOMECHANICALS) IMPLANT
RELOAD SUT SNGL STCH BLK 2-0 (ENDOMECHANICALS) IMPLANT
SCISSORS LAP 5X35 DISP (ENDOMECHANICALS) ×3 IMPLANT
SEALANT SURGICAL APPL DUAL CAN (MISCELLANEOUS) ×2 IMPLANT
SET IRRIG TUBING LAPAROSCOPIC (IRRIGATION / IRRIGATOR) ×3 IMPLANT
SHEARS CURVED HARMONIC AC 45CM (MISCELLANEOUS) ×2 IMPLANT
SLEEVE ADV FIXATION 12X100MM (TROCAR) IMPLANT
SLEEVE ADV FIXATION 5X100MM (TROCAR) IMPLANT
SLEEVE Z-THREAD 12X100MM (TROCAR) IMPLANT
SLEEVE Z-THREAD 5X100MM (TROCAR) ×1 IMPLANT
SOLUTION ANTI FOG 6CC (MISCELLANEOUS) ×3 IMPLANT
SPONGE GAUZE 4X4 12PLY (GAUZE/BANDAGES/DRESSINGS) ×3 IMPLANT
SPONGE LAP 4X18 X RAY DECT (DISPOSABLE) ×1 IMPLANT
STAPLER VISISTAT 35W (STAPLE) ×3 IMPLANT
STRIP CLOSURE SKIN 1/2X4 (GAUZE/BANDAGES/DRESSINGS) IMPLANT
SUT NOVA NAB GS-21 1 T12 (SUTURE) ×2 IMPLANT
SUT RELOAD ENDO STITCH 2 48X1 (ENDOMECHANICALS) ×8
SUT RELOAD ENDO STITCH 2.0 (ENDOMECHANICALS)
SUT VIC AB 2-0 SH 27 (SUTURE) ×3
SUT VIC AB 2-0 SH 27X BRD (SUTURE) ×2 IMPLANT
SUT VIC AB 4-0 SH 18 (SUTURE) ×3 IMPLANT
SUTURE RELOAD END STTCH 2 48X1 (ENDOMECHANICALS) ×8 IMPLANT
SUTURE RELOAD ENDO STITCH 2.0 (ENDOMECHANICALS) IMPLANT
SYR 20CC LL (SYRINGE) ×3 IMPLANT
SYR 30ML LL (SYRINGE) ×3 IMPLANT
SYR 50ML LL SCALE MARK (SYRINGE) ×3 IMPLANT
TAPE CLOTH SURG 4X10 WHT LF (GAUZE/BANDAGES/DRESSINGS) ×1 IMPLANT
TRAY FOLEY CATH 14FRSI W/METER (CATHETERS) ×3 IMPLANT
TRAY LAP CHOLE (CUSTOM PROCEDURE TRAY) ×1 IMPLANT
TROCAR ADV FIXATION 12X100MM (TROCAR) ×1 IMPLANT
TROCAR BLADELESS OPT 5 75 (ENDOMECHANICALS) ×2 IMPLANT
TROCAR XCEL 12X100 BLDLESS (ENDOMECHANICALS) ×2 IMPLANT
TROCAR Z-THREAD FIOS 12X100MM (TROCAR) IMPLANT
TROCAR Z-THREAD FIOS 5X100MM (TROCAR) ×4 IMPLANT
TUBING CONNECTING 10 (TUBING) ×3 IMPLANT
TUBING ENDO SMARTCAP (MISCELLANEOUS) ×3 IMPLANT
TUBING FILTER THERMOFLATOR (ELECTROSURGICAL) ×3 IMPLANT

## 2011-12-22 NOTE — Anesthesia Preprocedure Evaluation (Addendum)
Anesthesia Evaluation  Patient identified by MRN, date of birth, ID band Patient awake    Reviewed: Allergy & Precautions, H&P , NPO status , Patient's Chart, lab work & pertinent test results  Airway Mallampati: II TM Distance: >3 FB Neck ROM: Full    Dental No notable dental hx.    Pulmonary neg pulmonary ROS,  clear to auscultation  Pulmonary exam normal       Cardiovascular hypertension, Regular Normal    Neuro/Psych Negative Neurological ROS  Negative Psych ROS   GI/Hepatic Neg liver ROS, GERD-  ,  Endo/Other  Negative Endocrine ROS  Renal/GU negative Renal ROS  Genitourinary negative   Musculoskeletal negative musculoskeletal ROS (+)   Abdominal   Peds negative pediatric ROS (+)  Hematology negative hematology ROS (+)   Anesthesia Other Findings   Reproductive/Obstetrics negative OB ROS                           Anesthesia Physical Anesthesia Plan  ASA: II  Anesthesia Plan: General   Post-op Pain Management:    Induction: Intravenous  Airway Management Planned: Oral ETT  Additional Equipment:   Intra-op Plan:   Post-operative Plan: Extubation in OR  Informed Consent: I have reviewed the patients History and Physical, chart, labs and discussed the procedure including the risks, benefits and alternatives for the proposed anesthesia with the patient or authorized representative who has indicated his/her understanding and acceptance.   Dental advisory given  Plan Discussed with: CRNA  Anesthesia Plan Comments:         Anesthesia Quick Evaluation

## 2011-12-22 NOTE — Transfer of Care (Signed)
Immediate Anesthesia Transfer of Care Note  Patient: [name]  Procedure(s) Performed:  LAPAROSCOPIC REVISION OF GASTROJEJUNOSTOMY - Laparoscopic Revision of Gastricjejunostomy and Creation of neo gastrojejunostomy; UPPER GI ENDOSCOPY; HERNIA REPAIR VENTRAL ADULT  Patient Location: PACU  Anesthesia Type: General  Level of Consciousness: awake, alert , patient cooperative and responds to stimulation  Airway & Oxygen Therapy: Patient Spontanous Breathing and Patient connected to face mask oxygen  Post-op Assessment: Report given to PACU RN, Post -op Vital signs reviewed and stable and Patient moving all extremities  Post vital signs: Reviewed and stable  Complications: No apparent anesthesia complications

## 2011-12-22 NOTE — H&P (Addendum)
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 female had a gastric bypass done laparoscopically in [place] in 2005. Since then she has had 2 marginal ulcer perforations. One was taken care of by Dr. Lucretia Field the second last March by Dr. Andrey Campanile. She recently had a baby boy and postop his and found to have a new ulcer for which she is taking Carafate and maximum PPI therapy.  Dr. Andrey Campanile asked me to see her about setting her up for pouch reduction and revision of her gastrojejunostomy. I think we will go on to doing that hopefully laparoscopically although indicated with her 2 per perforations and she may have to have this done open. However also indicate that we may repair her ventral hernia at the same time.  However like to get a H. Pylori study to see if she is positive and if so then I would treat this I think we can wait a little longer before doing her surgery so that she has an adequate amount of FML a time. If it is negative then we may need to quit it and schedule revision sooner.  Past Medical History   Diagnosis  Date   .  ADHD (attention deficit hyperactivity disorder)    .  Depression    .  GERD (gastroesophageal reflux disease)    .  IBS (irritable bowel syndrome)    .  Incisional hernia    .  Ulcer    .  Anemia    .  Hypertension    .  Headache     Past Surgical History   Procedure  Date   .  Laparoscopic gastrotomy w/ repair of ulcer  10/23/09   .  Roux-en-y gastric bypass  2005   .  Cholecystectomy  2006   .  Lap repair gastrojejunal anastomotic perforation  02/21/11     Dr Andrey Campanile   .  No past surgeries    .  Esophagogastroduodenoscopy  11/28/2011     Procedure: ESOPHAGOGASTRODUODENOSCOPY (EGD); Surgeon: Theda Belfast, MD; Location: [facility]; Service: Endoscopy; Laterality: N/A;    Current Outpatient Prescriptions   Medication  Sig  Dispense  Refill   .  acetaminophen (TYLENOL) 500 MG  tablet  Take 1,000 mg by mouth every 6 (six) hours as needed. For pain     .  buPROPion (WELLBUTRIN XL) 150 MG 24 hr tablet  Take 150 mg by mouth daily.     .  citalopram (CELEXA) 40 MG tablet  TAKE 1 TABLET BY MOUTH AT BEDTIME  90 tablet  1   .  docusate sodium (COLACE) 100 MG capsule  Take 100 mg by mouth daily as needed. constipation     .  esomeprazole (NEXIUM) 40 MG capsule  Take 40 mg by mouth 2 (two) times daily.     .  ferrous sulfate 325 (65 FE) MG tablet  Take 325 mg by mouth daily with breakfast.     .  labetalol (NORMODYNE) 100 MG tablet  Take 1 tablet (100 mg total) by mouth 2 (two) times daily.  60 tablet  1   .  lisdexamfetamine (VYVANSE) 70 MG capsule  Take 70 mg by mouth 2 (two) times daily.     .  prenatal vitamin w/FE, FA (PRENATAL 1 + 1) 27-1 MG TABS  Take 1 tablet by mouth daily.     .  sucralfate (CARAFATE) 1 G tablet  Take 1 g by mouth 4 (four) times daily.     .  traZODone (DESYREL) 50 MG tablet  Take 50 mg by mouth at bedtime.      Review of patient's allergies indicates no known allergies.  Family History   Problem  Relation  Age of Onset   .  COPD  Father    .  Diabetes  Paternal Grandmother    .  Anesthesia problems  Neg Hx    .  Hypotension  Neg Hx    .  Malignant hyperthermia  Neg Hx    .  Pseudochol deficiency  Neg Hx    .  Heart disease  Maternal Grandmother     Social History: reports that she has never smoked. She has never used smokeless tobacco. She reports that she does not drink alcohol or use illicit drugs.  REVIEW OF SYSTEMS - PERTINENT POSITIVES ONLY:  Noncontributory  Physical Exam:  Blood pressure 156/92, pulse 88, temperature 98.9 F (37.2 C), temperature source Temporal, resp. rate 18, height 5\' 7"  (1.702 m), weight 184 lb 6.4 oz (83.643 kg).  Body mass index is 28.88 kg/(m^2).  Gen: WDWN white female NAD  Neurological: Alert and oriented to person, place, and time. Motor and sensory function is grossly intact  Head: Normocephalic and  atraumatic.  Eyes: Conjunctivae are normal. Pupils are equal, round, and reactive to light. No scleral icterus.  Neck: Normal range of motion. Neck supple. No tracheal deviation or thyromegaly present.  Cardiovascular: SR without murmurs or gallops. No carotid bruits Breast feeding  Respiratory: Effort normal. No respiratory distress. No chest wall tenderness. Breath sounds normal. No wheezes, rales or rhonchi.  Abdomen: Ventral hernia  GU:  Musculoskeletal: Normal range of motion. Extremities are nontender. No cyanosis, edema or clubbing noted Lymphadenopathy: No cervical, preauricular, postauricular or axillary adenopathy is present Skin: Skin is warm and dry. No rash noted. No diaphoresis. No erythema. No pallor. Pscyh: Normal mood and affect. Behavior is normal. Judgment and thought content normal.  LABORATORY RESULTS:  Results for orders placed during the hospital encounter of 12/19/11 (from the past 72 hour(s))  CBC     Status: Abnormal   Collection Time   12/19/11  9:45 AM      Component Value Range Comment   WBC 5.0  4.0 - 10.5 (K/uL)    RBC 4.31  3.87 - 5.11 (MIL/uL)    Hemoglobin 9.1 (*) 12.0 - 15.0 (g/dL)    HCT 40.9 (*) 81.1 - 46.0 (%)    MCV 71.7 (*) 78.0 - 100.0 (fL)    MCH 21.1 (*) 26.0 - 34.0 (pg)    MCHC 29.4 (*) 30.0 - 36.0 (g/dL)    RDW 91.4 (*) 78.2 - 15.5 (%)    Platelets 367  150 - 400 (K/uL)   COMPREHENSIVE METABOLIC PANEL     Status: Abnormal   Collection Time   12/19/11  9:45 AM      Component Value Range Comment   Sodium 139  135 - 145 (mEq/L)    Potassium 4.0  3.5 - 5.1 (mEq/L)    Chloride 104  96 - 112 (mEq/L)    CO2 27  19 - 32 (mEq/L)    Glucose, Bld 78  70 - 99 (mg/dL)    BUN 16  6 - 23 (mg/dL)    Creatinine, Ser 9.56  0.50 - 1.10 (mg/dL)    Calcium 9.1  8.4 - 10.5 (mg/dL)    Total Protein 7.0  6.0 -  8.3 (g/dL)    Albumin 3.4 (*) 3.5 - 5.2 (g/dL)    AST 13  0 - 37 (U/L)    ALT 13  0 - 35 (U/L)    Alkaline Phosphatase 94  39 - 117 (U/L)    Total  Bilirubin 0.3  0.3 - 1.2 (mg/dL)    GFR calc non Af Amer >90  >90 (mL/min)    GFR calc Af Amer >90  >90 (mL/min)   DIFFERENTIAL     Status: Normal   Collection Time   12/19/11  9:45 AM      Component Value Range Comment   Neutrophils Relative 51  43 - 77 (%)    Neutro Abs 2.5  1.7 - 7.7 (K/uL)    Lymphocytes Relative 35  12 - 46 (%)    Lymphs Abs 1.8  0.7 - 4.0 (K/uL)    Monocytes Relative 12  3 - 12 (%)    Monocytes Absolute 0.6  0.1 - 1.0 (K/uL)    Eosinophils Relative 2  0 - 5 (%)    Eosinophils Absolute 0.1  0.0 - 0.7 (K/uL)    Basophils Relative 1  0 - 1 (%)    Basophils Absolute 0.1  0.0 - 0.1 (K/uL)   SURGICAL PCR SCREEN     Status: Abnormal   Collection Time   12/19/11  9:59 AM      Component Value Range Comment   MRSA, PCR NEGATIVE  NEGATIVE     Staphylococcus aureus POSITIVE (*) NEGATIVE    .  RADIOLOGY RESULTS:  No results found.  Problem List:  Patient Active Problem List   Diagnoses   .  DEPRESSION   .  COUGH   .  Incisional hernia without mention of obstruction or gangrene   .  SVD (spontaneous vaginal delivery)   .  Lap Bypass 2005 [facility]-comp by 2 perforations    Assessment & Plan:  Perforations due to marginal ulcers that may well be related to a pouch that is too big.  Plan to revise the pouch thereby making it smaller and hopefully can be this laparoscope although we may need to do it open.   Will attempt to repair ventral hernia at same time.  Patient aware of risks and agrees with plan.  Matt B. Daphine Deutscher, MD, [facility]  [phone] beeper  [phone]  There has been no change in the patient's past medical history or physical exam in the past 24 hours to the best of my knowledge.  Expectations and outcome results have been discussed with the patient to include risks and benefits.  All questions have been answered and will proceed with previously discussed procedure noted and signed in the consent form in the patient's record.     Noell Lorensen BMD @NOW  12/22/2011

## 2011-12-22 NOTE — Progress Notes (Signed)
Called MD Daphine Deutscher concering patient's request for dilaudid she said morphine does not work well for her, [employer], Gemma Ruan N 12-22-11 1:39pm

## 2011-12-22 NOTE — Anesthesia Postprocedure Evaluation (Signed)
  Anesthesia Post-op Note  Patient: [name]  Procedure(s) Performed:  LAPAROSCOPIC REVISION OF GASTROJEJUNOSTOMY - Laparoscopic Revision of Gastricjejunostomy and Creation of neo gastrojejunostomy; UPPER GI ENDOSCOPY; HERNIA REPAIR VENTRAL ADULT  Patient Location: PACU  Anesthesia Type: General  Level of Consciousness: awake and alert   Airway and Oxygen Therapy: Patient Spontanous Breathing  Post-op Pain: mild  Post-op Assessment: Post-op Vital signs reviewed, Patient's Cardiovascular Status Stable, Respiratory Function Stable, Patent Airway and No signs of Nausea or vomiting  Post-op Vital Signs: stable  Complications: No apparent anesthesia complications

## 2011-12-22 NOTE — Op Note (Signed)
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 Kinzy Weyers M.D., FACS  Anesthesia: Gen.  Indications for procedure: The patient is a 36 year old Caucasian female who had undergone a laparoscopic Roux-en-Y gastric bypass in 2005 at [facility]. Unfortunately she has had 2 perforations at her gastrojejunostomy for marginal ulcers despite maximal PPI therapy. She developed recurrent epigastric pain a few weeks ago and underwent a repeat upper endoscopy which demonstrated a recurrent ulcer at her anastomosis. Dr. Daphine Deutscher brought her back to the operating room to undergo a revision of her gastrojejunostomy. Once we had revised her gastrojejunostomy, an upper endoscopy was performed to evaluate the patency of her anastomosis as well as to exclude any leak.  Description of procedure: After we have completed the new gastrojejunostomy, I scrubbed out and obtained the Olympus endoscope. I gently placed endoscope in the patient's oropharynx and gently glided it down her esophagus without any difficulty under direct visualization. Once I was in the gastric pouch, I insufflated the pouch was air. The pouch was approximately 4-1/2 cm to 5 cm in size. I was able to cannulate and advanced the scope through the gastrojejunostomy. Dr. Daphine Deutscher had placed saline in the upper abdomen. Upon further insufflation of the gastric pouch there was no evidence of bubbles. Upon further inspection of the gastric pouch, the mucosa appeared normal. There is no evidence of any mucosal abnormality. The gastric pouch and Roux limb were decompressed. The width of the gastrojejunal anastomosis was at least 3.5 cm. The scope was withdrawn. The patient tolerated this portion of the procedure well.  Please see Dr Ermalene Searing operative note for details regarding revision of the patient's  gastrojejunostomy.  Mary Sella. Andrey Campanile, MD, FACS General, Bariatric, & Minimally Invasive Surgery [facility], [place]

## 2011-12-22 NOTE — Op Note (Signed)
Surgeon: Pollyann Savoy. Daphine Deutscher, MD, FACS Asst:  Gaynelle Adu, MD, FACS Anesthesia: General endotracheal Drains: None  Procedure: Laparoscopic resection of prior gastrojejunostomy, redo gastrojejunostomy, Upper endoscopy. And open repair of supraumbilical hernia  Description of Procedure:  The patient had a Roux-en-Y gastric bypass for morbid obesity at [facility] back in 2005. Since then she has had 2 gastric perforations for  marginal ulcers and has recently been bothered with a supraumbilical hernia. She had a baby in December and is presently breast-feeding. She wanted to get her umbilical hernia repair and was concerned about repeat marginal ulceration which is developed and was worried about perforation.  She was taken to room 11 and [facility] on Monday, 12/22/2011 given general anesthesia. Timeout was performed after she was prepped with PCMX and draped sterilely. Excess the abdomen was achieved through the right upper quadrant with a second 5 mm the left upper quadrant and lower abdominal and mid abdominal 5 mm initially. Through these were changed on the right side to 12 mm for the subsequent portion of procedure. The first portion of the operation involved lysis of adhesions of the gastrojejunostomy to the liver and successfully retractor the liver area then completely mobilized gastrojejunostomy and when taking the gastric J. 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 I entered the stomach on the gastric pouch side and oversewed the remnant stomach side with figure-of-eight sutures 0 Vicryl using the Endo Stitch. Then stapled above this opening with a 2 applications of the Covidien stapler with a purple load. Distally the gastrojejunostomy site with a brown load and this was placed in a bag and saved to be brought out later through the hernia defect.  The Roux limb was then brought up and sewn to the backwall of the new stapled line on the stomach  pouch. Openings were made on both the stomach and the Roux limb and a 4.5 mm stent was introduced and fired creating the anastomosis. This is and closed from either end with 2-0 Vicryl. The Ewald tube was placed across the anastomosis and a second layer of free suture 2-0 Vicryl was placed and secured with Lapra-Ty's.  Endoscopy was then performed by Dr. Gaynelle Adu which demonstrated a small pouch was no bleeding and a patent anastomosis. No bubbles were seen and there was no evidence of a leak.   next the umbilical defect was exposed excising the skin in a transverse fashion removing an old scar and going down resecting the sac with a small defect was about the size of my index finger tip. Through that we extracted the bag without spillage. This defect was then closed transversely with 4 sutures of #1 Novafil and with an outer layer using an Endo Close to help approximate these well. There was some bleeder was controlled with the electrocautery and the peritoneum underlying this closure. The abdomen looked good at the completion of this. Fibrin sealant was used on the gastrojejunostomy.  Incisions were injected with Exparel and closed 4-0 Vicryl and staples. Patient our the procedure well and was taken to the recovery room in satisfactory condition.  Matt B. Daphine Deutscher, MD, FACS

## 2011-12-22 NOTE — Preoperative (Signed)
Beta Blockers   Reason not to administer Beta Blockers:Not Applicable 

## 2011-12-23 ENCOUNTER — Inpatient Hospital Stay (HOSPITAL_COMMUNITY): Payer: 59

## 2011-12-23 DIAGNOSIS — Z09 Encounter for follow-up examination after completed treatment for conditions other than malignant neoplasm: Secondary | ICD-10-CM

## 2011-12-23 LAB — DIFFERENTIAL
Basophils Relative: 0 % (ref 0–1)
Eosinophils Absolute: 0 10*3/uL (ref 0.0–0.7)
Lymphocytes Relative: 21 % (ref 12–46)
Lymphs Abs: 1.4 10*3/uL (ref 0.7–4.0)
Monocytes Absolute: 1 10*3/uL (ref 0.1–1.0)
Monocytes Relative: 15 % — ABNORMAL HIGH (ref 3–12)
Neutro Abs: 4 10*3/uL (ref 1.7–7.7)
Neutrophils Relative %: 63 % (ref 43–77)

## 2011-12-23 LAB — CBC
HCT: 30.1 % — ABNORMAL LOW (ref 36.0–46.0)
Hemoglobin: 8.8 g/dL — ABNORMAL LOW (ref 12.0–15.0)
MCH: 20.9 pg — ABNORMAL LOW (ref 26.0–34.0)
MCHC: 29.2 g/dL — ABNORMAL LOW (ref 30.0–36.0)

## 2011-12-23 MED ORDER — LACTATED RINGERS IV SOLN
INTRAVENOUS | Status: DC
  Administered 2011-12-23 – 2011-12-24 (×5): via INTRAVENOUS
  Administered 2011-12-24: 125 mL/h via INTRAVENOUS

## 2011-12-23 NOTE — Progress Notes (Signed)
Patient ID: [name], female   DOB: 1976/08/25, 36 y.o.   MRN: [id number] [facility] Progress Note:   1 Day Post-Op  Subjective: Mental status is clear and alert.  No abdominal complaints.  Seen in radiology during swallow.  Delayed emptying.   Objective: Vital signs in last 24 hours: Temp:  [98.1 F (36.7 C)-99.7 F (37.6 C)] 98.5 F (36.9 C) (01/29 0537) Pulse Rate:  [76-101] 89  (01/29 0537) Resp:  [13-18] 18  (01/29 0537) BP: (148-171)/(80-102) 158/90 mmHg (01/29 0537) SpO2:  [98 %-100 %] 99 % (01/29 0537) Weight:  [191 lb 11.2 oz (86.955 kg)] 191 lb 11.2 oz (86.955 kg) (01/28 1300)  Intake/Output from previous day: 01/28 0701 - 01/29 0700 In: 3800 [I.V.:3800] Out: 4150 [Urine:4100; Blood:50] Intake/Output this shift: Total I/O In: -  Out: 750 [Urine:750]  Physical Exam: Work of breathing is  Normal No abdominal pain  Lab Results:  Results for orders placed during the hospital encounter of 12/22/11 (from the past 48 hour(s))  PREGNANCY, URINE     Status: Normal   Collection Time   12/22/11  6:45 AM      Component Value Range Comment   Preg Test, Ur NEGATIVE  NEGATIVE    CBC     Status: Abnormal   Collection Time   12/22/11  2:15 PM      Component Value Range Comment   WBC 9.8  4.0 - 10.5 (K/uL)    RBC 4.15  3.87 - 5.11 (MIL/uL)    Hemoglobin 8.8 (*) 12.0 - 15.0 (g/dL) RESULT CHECKED   HCT 40.9 (*) 36.0 - 46.0 (%)    MCV 70.8 (*) 78.0 - 100.0 (fL)    MCH 21.2 (*) 26.0 - 34.0 (pg)    MCHC 29.9 (*) 30.0 - 36.0 (g/dL)    RDW 81.1 (*) 91.4 - 15.5 (%)    Platelets 327  150 - 400 (K/uL)   CREATININE, SERUM     Status: Normal   Collection Time   12/22/11  2:15 PM      Component Value Range Comment   Creatinine, Ser 0.54  0.50 - 1.10 (mg/dL)    GFR calc non Af Amer >90  >90 (mL/min)    GFR calc Af Amer >90  >90 (mL/min)   CBC     Status: Abnormal   Collection Time   12/23/11  3:45 AM      Component Value Range Comment   WBC 6.3  4.0 - 10.5 (K/uL)      RBC 4.22  3.87 - 5.11 (MIL/uL)    Hemoglobin 8.8 (*) 12.0 - 15.0 (g/dL)    HCT 78.2 (*) 95.6 - 46.0 (%)    MCV 71.3 (*) 78.0 - 100.0 (fL)    MCH 20.9 (*) 26.0 - 34.0 (pg)    MCHC 29.2 (*) 30.0 - 36.0 (g/dL)    RDW 21.3 (*) 08.6 - 15.5 (%)    Platelets 306  150 - 400 (K/uL)   DIFFERENTIAL     Status: Abnormal   Collection Time   12/23/11  3:45 AM      Component Value Range Comment   Neutrophils Relative 63  43 - 77 (%)    Neutro Abs 4.0  1.7 - 7.7 (K/uL)    Lymphocytes Relative 21  12 - 46 (%)    Lymphs Abs 1.4  0.7 - 4.0 (K/uL)    Monocytes Relative 15 (*) 3 - 12 (%)    Monocytes Absolute 1.0  0.1 - 1.0 (K/uL)    Eosinophils Relative 1  0 - 5 (%)    Eosinophils Absolute 0.0  0.0 - 0.7 (K/uL)    Basophils Relative 0  0 - 1 (%)    Basophils Absolute 0.0  0.0 - 0.1 (K/uL)     Radiology/Results: No results found.  Anti-infectives: Anti-infectives     Start     Dose/Rate Route Frequency Ordered Stop   12/22/11 0645   cefOXitin (MEFOXIN) 2 g in dextrose 5 % 50 mL IVPB        2 g 100 mL/hr over 30 Minutes Intravenous 60 min pre-op 12/22/11 [id number] 12/22/11 0745          Assessment/Plan: Problem List: Patient Active Problem List  Diagnoses  . DEPRESSION  . COUGH  . Incisional hernia without mention of obstruction or gangrene  . SVD (spontaneous vaginal delivery)  . Lap Bypass 2005 [facility]-comp by 2 perforations    Hg 8.8 and delayed gastric emptying.  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  Will repeat xay this pm and offer water ad lib.  Will increase IV for lactation needs.  1 Day Post-Op    LOS: 1 day   Matt B. Daphine Deutscher, MD, [facility] [phone] beeper [phone]  12/23/2011 10:06 AM

## 2011-12-23 NOTE — Progress Notes (Signed)
Bilateral:  No evidence of DVT, superficial thrombosis, or Baker's Cyst.    Rosalyn Archambault D.,RVS 12/23/2011 8:51 AM

## 2011-12-23 NOTE — Progress Notes (Signed)
Pt alert and oriented; Pleasant affect;  having her Doppler study done; VSS; HGB 8.8 and pt states this is her "normal"; HGB 4 days ago was 9.1 and 2 months ago was 8.2; asymptomatic; denies any nausea or vomiting; remains NPO; pt states she is burping but no flatus; ambulating well; voiding well; c/o some abdominal "soreness" and verbalized relief with prn pain meds; awaiting UGI; pt is currently breastfeeding and has her breast pump at her bedside; offered to contact Lactation consultant at [facility] if she had any concerns or questions and pt declined offer.  GASTRIC BYPASS/SLEEVE DISCHARGE INSTRUCTIONS  Drs. Fredrik Rigger, [place]oxworth, Wilson, and [place] Call if you have any problems.   Call [phone] and ask for the surgeon on call.    If you need immediate assistance come to the ER at [facility]. Tell the ER personnel that you are a new post-op gastric bypass patient. Signs and symptoms to report:   Severe vomiting or nausea. If you cannot tolerate clear liquids for longer than 1 day, you need to call your surgeon.    Abdominal pain which does not get better after taking your pain medication   Fever greater than 101 F degree   Difficulty breathing   Chest pain    Redness, swelling, drainage, or foul odor at incision sites    If your incisions open or pull apart   Swelling or pain in calf (lower leg)   Diarrhea, frequent watery, uncontrolled bowel movements.   Constipation, (no bowel movements for 3 days) if this occurs, Take Milk of Magnesia, 2 tablespoons by mouth, 3 times a day for 2 days if needed.  Call your doctor if constipation continues. Stop taking Milk of Magnesia once you have had a bowel movement. You may also use Miralax according to the label instructions.   Anything you consider "abnormal for you".   Normal side effects after Surgery:   Unable to sleep at night or concentrate   Irritability   Being tearful (crying) or depressed   These are common complaints,  possibly related to your anesthesia, stress of surgery and change in lifestyle, that usually go away a few weeks after surgery.  If these feelings continue, call your medical doctor.  Wound Care You may have surgical glue, steri-strips, or staples over your incisions after surgery.  Surgical glue:  Looks like a clear film over your incisions and will wear off gradually. Steri-strips: Strips of tape over your incisions. You may notice a yellowish color on the skin underneath the steri-strips. This is a substance used to make the steri-strips stick better. Do not pull the steri-strips off - let them fall off.  Staples: Cherlynn Polo may be removed before you leave the hospital. If you go home with staples, call [facility] [phone]) for an appointment with your surgeon's nurse to have staples removed in 7 - 10 days. Showering: You may shower two days after your surgery unless otherwise instructed by your surgeon. Wash gently around wounds with warm soapy water, rinse well, and gently pat dry.  If you have a drain, you may need someone to hold this while you shower. Avoid tub baths until staples are removed and incisions are healed.    Medications   Medications should be liquid or crushed if larger than the size of a dime.  Extended release pills should not be crushed.   Depending on the size and number of medications you take, you may need to stagger/change the time you take  your medications so that you do not over-fill your pouch.    Make sure you follow-up with your primary care physician to make medication adjustments needed during rapid weight loss and life-style adjustment.   If you are diabetic, follow up with the doctor that prescribes your diabetes medication(s) within one week after surgery and check your blood sugar regularly.   Do not drive while taking narcotics!   Do not take acetaminophen (Tylenol) and Roxicet or Lortab Elixir at the same time since these pain medications  contain acetaminophen.  Diet at home: (First 2 Weeks) You will see the nutritionist two weeks after your surgery. She will advance your diet if you are tolerating liquids well. Once at home, if you have severe vomiting or nausea and cannot tolerate clear liquids lasting longer than 1 day, call your surgeon.  Begin high protein shake 2 ounces every 3 hours, 5 - 6 times per day.  Gradually increase the amount you drink as tolerated.  You may find it easier to slowly sip shakes throughout the day.  It is important to get your proteins in first.   Protein Shake   Drink at least 2 ounces of shake 5-6 times per day   Each serving of protein shakes should have a minimum of 15 grams of protein and no more than 5 grams of carbohydrate    Increase the amount of protein shake you drink as tolerated   Protein powder may be added to fluids such as non-fat milk or Lactaid milk (limit to 20 grams added protein powder per serving   The initial goal is to drink at least 8 ounces of protein shake/drink per day (or as directed by the nutritionist). Some examples of protein shakes are [employer], Dillard's, EAS Edge HP, and Unjury. Hydration   Gradually increase the amount of water and other liquids as tolerated (See Acceptable Fluids)   Gradually increase the amount of protein shake as tolerated     Sip fluids slowly and throughout the day   May use Sugar substitutes, use sparingly (limit to 6 - 8 packets per day). Your fluid goal is 64 ounces of fluid daily. It may take a few weeks to build up to this.         32 oz (or more) should be clear liquids and 32 oz (or more) should be full liquids.         Liquids should not contain sugar, caffeine, or carbonation! Acceptable Fluids Clear Liquids:   Water or Sugar-free flavored water, Fruit H2O   Decaffeinated coffee or tea (sugar-free)   Crystal Lite, Wyler's Lite, Minute Maid Lite   Sugar-free Jell-O   Bouillon or broth   Sugar-free Popsicle:   *Less  than 20 calories each; Limit 1 per day   Full Liquids:              Protein Shakes/Drinks + 2 choices per day of other full liquids shown below.    Other full liquids must be: No more than 12 grams of Carbs per serving,  No more than 3 grams of Fat per serving   Strained low-fat cream soup   Non-Fat milk   Fat-free Lactaid Milk   Sugar-free yogurt (Dannon Lite & Fit) Vitamins and Minerals (Start 1 day after surgery unless otherwise directed)   2 Chewable Multivitamin / Multimineral Supplement (i.e. Centrum for Adults)   Chewable Calcium Citrate with Vitamin D-3. Take 1500 mg each day.           (  Example: 3 Chewable Calcium Plus 600 with Vitamin D-3 can be found at [facility])         Vitamin B-12, 350 - 500 micrograms (oral tablet) each day   Do not mix multivitamins containing iron with calcium supplements; take 2 hours   apart   Do not substitute Tums (calcium carbonate) for your calcium   Menstruating women and those at risk for anemia may need extra iron. Talk with your doctor to see if you need additional iron.    If you need extra iron:  Total daily Iron recommendations (including Vitamins) = 50 - 100 mg Iron/day Do not stop taking or change any vitamins or minerals until you talk to your nutritionist or surgeon. Your nutritionist and / or physician must approve all vitamin and mineral supplements. Exercise For maximum success, begin exercising as soon as your doctor recommends. Make sure your physician approves any physical activity.   Depending on fitness level, begin with a simple walking program   Walk 5-15 minutes each day, 7 days per week.    Slowly increase until you are walking 30-45 minutes per day   Consider joining our BELT program. [phone] or email [email or web address] Things to remember:    You may have sexual relations when you feel comfortable. It is VERY important for female patients to use a reliable birth control method. Fertility often increases after surgery. Do not  get pregnant for at least 18 months.   It is very important to keep all follow up appointments with your surgeon, nutritionist, primary care physician, and behavioral health practitioner. After the first year, please follow up with your bariatric surgeon at least once a year in order to maintain best weight loss results.  [facility]: [phone] [facility]: [phone]   Free counseling is available for you and your family through collaboration between [facility] and [place]. Please call [phone] and leave a message.    Consider purchasing a medical alert bracelet that says you had gastric bypass surgery.    The [facility] has a free Bariatric Surgery Support Group that meets monthly, the 3rd Thursday, 6 pm, Classroom #1, [employer]. You may register online at [email or web address], but registration is not necessary. Select Classes and Support Groups, Bariatric Surgery, or Call [phone]   Do not return to work or drive until cleared by your surgeon   Use your CPAP when sleeping if applicable Do not lift anything greater than ten pounds for at least two weeks  Talmadge Chad, RN Bariatric Nurse Coordinator

## 2011-12-23 NOTE — Progress Notes (Addendum)
Nutrition Education Note  - Received referral to speak with pt from outpatient bariatric RD regarding post op gastric bypass diet instruction in combination with pt's goal to continue breastfeeding as much as possible. Educated pt on post-op diet with emphasis on adequate fluid intake of non-carbonated/caffeinated and sugar free beverages with high protein shake 5-6 times/day. Provided handout of this information. Provided outpatient bariatric RD contact information and encouraged pt to contact at discharge to set up appointment. Also explained to pt the importance of adequate fluid intake and goal of consuming at least 2,000 calories/day to promote breastmilk production can conflict with post-op diet. Suggested weighing baby before and after feedings to ensure pt gaining adequate weight and for pt to follow up closely with pediatrician. Pt states she has some supplemental formula in case baby does not gain weight and pt unable to continue to breastfeed. Pt hopeful she can continue to breastfeed, but if not "it won't be the end of the world". Pt realistic about diet challenges ahead. Pt without any further educational needs. Will monitor for diet advancement and further questions.   Nutrition dx:  Nutrition-related knowledge deficit r/t post-op gastric bypass diet therapy AEB outpatient bariatric RD request  Intervention:  Brief education;  Provided.  Goals of nutrition therapy discussed.  Understanding confirmed.  RD contact information provided.  Monitoring:  Knowledge; for questions.  Please consult RD if new questions present.  Pager: [phone]

## 2011-12-24 LAB — DIFFERENTIAL
Basophils Absolute: 0 10*3/uL (ref 0.0–0.1)
Eosinophils Absolute: 0.1 10*3/uL (ref 0.0–0.7)
Eosinophils Relative: 1 % (ref 0–5)
Monocytes Absolute: 1.1 10*3/uL — ABNORMAL HIGH (ref 0.1–1.0)
Neutrophils Relative %: 65 % (ref 43–77)

## 2011-12-24 LAB — CBC
HCT: 29.2 % — ABNORMAL LOW (ref 36.0–46.0)
Hemoglobin: 8.6 g/dL — ABNORMAL LOW (ref 12.0–15.0)
MCV: 71.9 fL — ABNORMAL LOW (ref 78.0–100.0)
RDW: 17.5 % — ABNORMAL HIGH (ref 11.5–15.5)
WBC: 7.5 10*3/uL (ref 4.0–10.5)

## 2011-12-24 MED ORDER — PANTOPRAZOLE SODIUM 40 MG IV SOLR
40.0000 mg | Freq: Every day | INTRAVENOUS | Status: DC
  Administered 2011-12-24 (×2): 40 mg via INTRAVENOUS
  Filled 2011-12-24 (×3): qty 40

## 2011-12-24 MED FILL — Mupirocin Oint 2%: CUTANEOUS | Qty: 22 | Status: AC

## 2011-12-24 NOTE — Progress Notes (Signed)
Called Dr. Daphine Deutscher to clarify pt's diet order and lactation needs; Dr. Daphine Deutscher had advanced pt to POD #2 diet which includes protein shakes, water and clear liquids; order states 2 ounces of bariatric shake 4 times a day however with pt lactating, she can increase her volume slowly as she tolerates; pt is aware and understands POD #2 diet. Reviewed this information with Shanda Bumps, RN. Talmadge Chad, RN Bariatric Nurse Coordinator

## 2011-12-24 NOTE — Progress Notes (Signed)
Pt alert and oriented; pleasant affect; VSS; denies nausea or vomiting; + flatus; no BM; voiding without difficulty; ambulating in hallways well; tolerating protein shake thus far; Nutritionist met with pt yesterday and pt understands post op diet and lactation needs; pt still pumping breasts without difficulty; c/o some abdominal pain but verbalizes relief with prn pain meds; mother at bedside; continue to monitor today. Talmadge Chad, RN Bariatric Nurse Coordinator

## 2011-12-24 NOTE — Progress Notes (Signed)
Pt has been educated regarding correct use of Incentive Spirometer (IS). Pt verbalizes understanding of correct use. Pt states that there is no further questions regarding correct use of the IS. Mayes Sangiovanni Lorraine Kirklin Mcduffee, RN    

## 2011-12-24 NOTE — Progress Notes (Signed)
CARE MANAGEMENT NOTE 12/24/2011  Patient:  [name], [name]   Account Number:  [id number]  Date Initiated:  12/24/2011  Documentation initiated by:  Takiesha Mcdevitt  Subjective/Objective Assessment:   36 yo female admitted 12/22/11 with recurrent marginal ulders     Action/Plan:   D/C when medically stable   Anticipated DC Date:  12/27/2011   Anticipated DC Plan:  HOME/SELF CARE      DC Planning Services  CM consult               Status of service:  Completed, signed off  Discharge Disposition:  HOME/SELF CARE  Comments:  12/24/11, Kathi Der RNC-MNN, BSN, [phone], CM referral entered for DME.  DME is actually a diet order for POD # 1 and 2.  Pt's nurse, Shanda Bumps, notified of diet order.

## 2011-12-25 MED ORDER — OXYCODONE-ACETAMINOPHEN 5-325 MG/5ML PO SOLN: 5.0000 mL | ORAL | Status: AC | PRN

## 2011-12-25 NOTE — Progress Notes (Signed)
Pt continues to ask for food, at 0330 pt had chocolate shake. At 0556 pt requesting unjury chicken broth, informed patient to take fluid's slow, since lactating, pt is allowed to increase volume slolwy per bariatric RN note. Pt reeducated and verbalized understanding

## 2011-12-25 NOTE — Progress Notes (Signed)
Pt alert and oriented and states she is ready to go home; Temp max 100.9 last night and down the 99.3 this am; encouraged pt to keep in tune and monitor her temp and call MD if temp >100.4; denies nausea or vomiting; able to tolerate protein shakes, water and clear liquids well; +flatus; no BM yet; voiding without difficulty; ambulating well; mother at bedside; discharge instructions being reviewed by [facility]; pt denies any questions about instructions; pt already has follow up appts with [facility] and [facility]; encouraged pt to contact lactation consultant at [facility] for any questions or concerns regarding breastfeeding and milk supply. GASTRIC BYPASS/SLEEVE DISCHARGE INSTRUCTIONS  Drs. Fredrik Rigger, [place], [place], and [place] Call if you have any problems.   Call [phone] and ask for the surgeon on call.    If you need immediate assistance come to the ER at [facility]. Tell the ER personnel that you are a new post-op gastric bypass patient. Signs and symptoms to report:   Severe vomiting or nausea. If you cannot tolerate clear liquids for longer than 1 day, you need to call your surgeon.    Abdominal pain which does not get better after taking your pain medication   Fever greater than 101 F degree   Difficulty breathing   Chest pain    Redness, swelling, drainage, or foul odor at incision sites    If your incisions open or pull apart   Swelling or pain in calf (lower leg)   Diarrhea, frequent watery, uncontrolled bowel movements.   Constipation, (no bowel movements for 3 days) if this occurs, Take Milk of Magnesia, 2 tablespoons by mouth, 3 times a day for 2 days if needed.  Call your doctor if constipation continues. Stop taking Milk of Magnesia once you have had a bowel movement. You may also use Miralax according to the label instructions.   Anything you consider "abnormal for you".   Normal side effects after Surgery:   Unable to sleep at night or concentrate   Irritability   Being tearful  (crying) or depressed   These are common complaints, possibly related to your anesthesia, stress of surgery and change in lifestyle, that usually go away a few weeks after surgery.  If these feelings continue, call your medical doctor.  Wound Care You may have surgical glue, steri-strips, or staples over your incisions after surgery.  Surgical glue:  Looks like a clear film over your incisions and will wear off gradually. Steri-strips: Strips of tape over your incisions. You may notice a yellowish color on the skin underneath the steri-strips. This is a substance used to make the steri-strips stick better. Do not pull the steri-strips off - let them fall off.  Staples: Cherlynn Polo may be removed before you leave the hospital. If you go home with staples, call [facility] [phone]) for an appointment with your surgeon's nurse to have staples removed in 7 - 10 days. Showering: You may shower two days after your surgery unless otherwise instructed by your surgeon. Wash gently around wounds with warm soapy water, rinse well, and gently pat dry.  If you have a drain, you may need someone to hold this while you shower. Avoid tub baths until staples are removed and incisions are healed.    Medications   Medications should be liquid or crushed if larger than the size of a dime.  Extended release pills should not be crushed.   Depending on the size and number of medications you take, you may need  to stagger/change the time you take your medications so that you do not over-fill your pouch.    Make sure you follow-up with your primary care physician to make medication adjustments needed during rapid weight loss and life-style adjustment.   If you are diabetic, follow up with the doctor that prescribes your diabetes medication(s) within one week after surgery and check your blood sugar regularly.   Do not drive while taking narcotics!   Do not take acetaminophen (Tylenol) and Roxicet or Lortab  Elixir at the same time since these pain medications contain acetaminophen.  Diet at home: (First 2 Weeks) You will see the nutritionist two weeks after your surgery. She will advance your diet if you are tolerating liquids well. Once at home, if you have severe vomiting or nausea and cannot tolerate clear liquids lasting longer than 1 day, call your surgeon.  Begin high protein shake 2 ounces every 3 hours, 5 - 6 times per day.  Gradually increase the amount you drink as tolerated.  You may find it easier to slowly sip shakes throughout the day.  It is important to get your proteins in first.   Protein Shake   Drink at least 2 ounces of shake 5-6 times per day   Each serving of protein shakes should have a minimum of 15 grams of protein and no more than 5 grams of carbohydrate    Increase the amount of protein shake you drink as tolerated   Protein powder may be added to fluids such as non-fat milk or Lactaid milk (limit to 20 grams added protein powder per serving   The initial goal is to drink at least 8 ounces of protein shake/drink per day (or as directed by the nutritionist). Some examples of protein shakes are [employer], [employer]illard's, EAS Edge HP, and Unjury. Hydration   Gradually increase the amount of water and other liquids as tolerated (See Acceptable Fluids)   Gradually increase the amount of protein shake as tolerated     Sip fluids slowly and throughout the day   May use Sugar substitutes, use sparingly (limit to 6 - 8 packets per day). Your fluid goal is 64 ounces of fluid daily. It may take a few weeks to build up to this.         32 oz (or more) should be clear liquids and 32 oz (or more) should be full liquids.         Liquids should not contain sugar, caffeine, or carbonation! Acceptable Fluids Clear Liquids:   Water or Sugar-free flavored water, Fruit H2O   Decaffeinated coffee or tea (sugar-free)   Crystal Lite, Wyler's Lite, Minute Maid Lite   Sugar-free  Jell-O   Bouillon or broth   Sugar-free Popsicle:   *Less than 20 calories each; Limit 1 per day   Full Liquids:              Protein Shakes/Drinks + 2 choices per day of other full liquids shown below.    Other full liquids must be: No more than 12 grams of Carbs per serving,  No more than 3 grams of Fat per serving   Strained low-fat cream soup   Non-Fat milk   Fat-free Lactaid Milk   Sugar-free yogurt (Dannon Lite & Fit) Vitamins and Minerals (Start 1 day after surgery unless otherwise directed)   2 Chewable Multivitamin / Multimineral Supplement (i.e. Centrum for Adults)   Chewable Calcium Citrate with Vitamin D-3. Take 1500 mg each day.           (  Example: 3 Chewable Calcium Plus 600 with Vitamin D-3 can be found at [facility])         Vitamin B-12, 350 - 500 micrograms (oral tablet) each day   Do not mix multivitamins containing iron with calcium supplements; take 2 hours   apart   Do not substitute Tums (calcium carbonate) for your calcium   Menstruating women and those at risk for anemia may need extra iron. Talk with your doctor to see if you need additional iron.    If you need extra iron:  Total daily Iron recommendations (including Vitamins) = 50 - 100 mg Iron/day Do not stop taking or change any vitamins or minerals until you talk to your nutritionist or surgeon. Your nutritionist and / or physician must approve all vitamin and mineral supplements. Exercise For maximum success, begin exercising as soon as your doctor recommends. Make sure your physician approves any physical activity.   Depending on fitness level, begin with a simple walking program   Walk 5-15 minutes each day, 7 days per week.    Slowly increase until you are walking 30-45 minutes per day   Consider joining our BELT program. [phone] or email [email or web address] Things to remember:    You may have sexual relations when you feel comfortable. It is VERY important for female patients to use a reliable birth  control method. Fertility often increases after surgery. Do not get pregnant for at least 18 months.   It is very important to keep all follow up appointments with your surgeon, nutritionist, primary care physician, and behavioral health practitioner. After the first year, please follow up with your bariatric surgeon at least once a year in order to maintain best weight loss results.  [facility]: [phone] [facility]: [phone]   Free counseling is available for you and your family through collaboration between [facility] and [place]. Please call [phone] and leave a message.    Consider purchasing a medical alert bracelet that says you had gastric bypass surgery.    The [facility] has a free Bariatric Surgery Support Group that meets monthly, the 3rd Thursday, 6 pm, Classroom #1, [employer]. You may register online at [email or web address], but registration is not necessary. Select Classes and Support Groups, Bariatric Surgery, or Call [phone]   Do not return to work or drive until cleared by your surgeon   Use your CPAP when sleeping if applicable Do not lift anything greater than ten pounds for at least two weeks Talmadge Chad, RN BAriatric Nurse Coordinator

## 2011-12-25 NOTE — Discharge Summary (Signed)
Physician Discharge Summary  Patient ID: [name] MRN: [id number] DOB/AGE: 1976/08/23 36 y.o.  Admit date: 12/22/2011 Discharge date: 12/25/2011  Admission Diagnoses:  Recurrent marginal ulcers with perforation after gastric bypass in 2005  Discharge Diagnoses:   S/p revision of gastrojeunostomy laparoscopically Active Problems:  * No active hospital problems. *    Surgery:  Laparoscopic revision of gastrojeunostomy with endoscopy  Discharged Condition: improved  Hospital Course:   Surgery performed.  UGI showed delayed gastric emptying on PD 1.  Breast feeding and patient had inadequate intake initially to sustain discharge.  Ready to go home on PD 3  Consults: none  Significant Diagnostic Studies: UGI    Discharge Exam: Blood pressure 153/95, pulse 104, temperature 99.3 F (37.4 C), temperature source Oral, resp. rate 20, height 5\' 6"  (1.676 m), weight 191 lb 11.2 oz (86.955 kg), SpO2 95.00%, currently breastfeeding. Staples removed at discharge.   Disposition: Home or Self Care  Discharge Orders    Future Orders Please Complete By Expires   Diet - low sodium heart healthy      Increase activity slowly        Medication List  As of 12/25/2011  8:22 AM   STOP taking these medications         sucralfate 1 G tablet         TAKE these medications         acetaminophen 500 MG tablet   Commonly known as: TYLENOL   Take 1,000 mg by mouth every 6 (six) hours as needed. For pain      buPROPion 150 MG 24 hr tablet   Commonly known as: WELLBUTRIN XL   Take 150 mg by mouth daily before breakfast.      CALCIUM-VITAMIN D PO   Take 1 tablet by mouth daily.      citalopram 40 MG tablet   Commonly known as: CELEXA   40 mg every morning.      docusate sodium 100 MG capsule   Commonly known as: COLACE   Take 100 mg by mouth daily as needed. constipation      esomeprazole 40 MG capsule   Commonly known as: NEXIUM   Take 40 mg by mouth 2 (two) times daily.     ferrous sulfate 325 (65 FE) MG tablet   Take 325 mg by mouth daily with breakfast.      labetalol 100 MG tablet   Commonly known as: NORMODYNE   Take 1 tablet (100 mg total) by mouth 2 (two) times daily.      lisdexamfetamine 70 MG capsule   Commonly known as: VYVANSE   Take 140 mg by mouth daily before breakfast.      oxyCODONE-acetaminophen 5-325 MG/5ML solution   Commonly known as: ROXICET   Take 5-10 mLs by mouth every 4 (four) hours as needed.      prenatal vitamin w/FE, FA 27-1 MG Tabs   Take 1 tablet by mouth daily.      traZODone 50 MG tablet   Commonly known as: DESYREL   Take 50 mg by mouth at bedtime.           Follow-up Information    Follow up with Luretha Murphy B, MD. Schedule an appointment as soon as possible for a visit in 2 weeks.   Contact information:   [employer] [address], [address] [place] [address] [phone]          Signed: Valarie Merino 12/25/2011,  8:22 AM

## 2011-12-25 NOTE — Progress Notes (Signed)
Pt intake increased this pm, pt has had unjury chocolate shake, water, sips of the unjury chicken broth along with her crystal light. Pt stated that she was tolerating well with no nausea or vomiting. At 0250 pt requested jello, informed patient that this was not on the pod 3 diet and that currently she should stick with the liquids and discuss the jello in am with dr. Daphine Deutscher

## 2012-01-01 ENCOUNTER — Encounter: Payer: 59 | Attending: Surgery | Admitting: *Deleted

## 2012-01-01 ENCOUNTER — Encounter: Payer: Self-pay | Admitting: *Deleted

## 2012-01-01 DIAGNOSIS — Z713 Dietary counseling and surveillance: Secondary | ICD-10-CM | POA: Insufficient documentation

## 2012-01-01 DIAGNOSIS — Z09 Encounter for follow-up examination after completed treatment for conditions other than malignant neoplasm: Secondary | ICD-10-CM | POA: Insufficient documentation

## 2012-01-01 DIAGNOSIS — Z9884 Bariatric surgery status: Secondary | ICD-10-CM | POA: Insufficient documentation

## 2012-01-01 NOTE — Progress Notes (Signed)
  Follow-up visit: 2 Weeks Post-Operative Gastric Bypass Revision Surgery  Medical Nutrition Therapy:  Appt start time: 1630 end time:  1715.  Assessment:  Primary concerns today: post-operative bariatric surgery nutrition management. Pt s/p RNY Gastric Bypass revision surgery due to perforation. She has a 37 week old infant which she is attempting to breast feed. Pt was seen by inpatient RD who encouraged increased fluid/protein to meeting needs post surgery and for breastfeeding. She is doing well with fluids, reports no episodes of N/V and states she has had some diarrhea, which is improving. She has added a few high protein soft foods to diet which she is tolerating well. Encouraged pt to stay on supplements (including Pre [employer]) and continue to meet fluid and protein goals.  Weight today: 167.2 lbs Weight change: Pt believes she has lost 10 lbs since revision BMI: 26.2% Weight goal: 155 lbs  Surgery date: 2005 @ [facility]  24-hr recall:  B (AM): Unjury Protein Shake Snk (AM): Unjury Protein Shake  L (PM): 1/2 cup cottage cheese Snk (PM): Unjury Protein Shake  D (PM): 1/2 cup Mashed potatoes Snk (PM): 1/2 cup cottage cheese  Fluid intake: 48 oz Crystal Light, Protein Shakes (24 oz) Estimated total protein intake: 85g+  Medications: No changes since revision Supplementation: Taking Pre-natal Vitamins (200% DV), 1000mg  Ca Citrate, B12  Using straws: No Drinking while eating: No Hair loss: Yes, minimal Carbonated beverages: No N/V/D/C: Diarrhea Dumping syndrome: No  Recent physical activity:  Limited s/p surgery; Pt with 10 week newborn  Progress Towards Goal(s):  In progress.  Handouts given during visit include:  Phase 3B - Soft High Protein Diet  Breastfeeding guidelines  Unjury Protein Handout   Nutritional Diagnosis:  Mesic-1.3 Breastfeeding difficulty As related to recent RNY Gastric Bypass Revision.  As evidenced by pt consuming inadequate kcal/fluids to meet  breastfeeding needs.    Intervention:  Nutrition education/reinforcement.  Monitoring/Evaluation:  Dietary intake, exercise, lap band fills, and body weight. Follow up PRN.

## 2012-01-01 NOTE — Patient Instructions (Signed)
Goals:  Follow Phase 3A: Soft High Protein Phase  Continue protein supplements as needed to reach goals  Eat 3-6 small meals/snacks, every 3-5 hrs  Increase lean protein foods to meet 60-95g goal  Increase fluid intake to 64-100 oz fluid to support breastfeeding  Avoid drinking 15 minutes before, during and 30 minutes after eating  Aim for >30 min of physical activity daily per MD  Continue with supplementation per ASMBS regularly

## 2012-01-02 ENCOUNTER — Encounter: Payer: Self-pay | Admitting: Medical

## 2012-01-02 ENCOUNTER — Ambulatory Visit (INDEPENDENT_AMBULATORY_CARE_PROVIDER_SITE_OTHER): Payer: 59 | Admitting: Medical

## 2012-01-02 VITALS — BP 100/70 | HR 100 | Temp 98.2°F | Resp 16 | Wt 168.0 lb

## 2012-01-02 DIAGNOSIS — R51 Headache: Secondary | ICD-10-CM

## 2012-01-02 DIAGNOSIS — Z9884 Bariatric surgery status: Secondary | ICD-10-CM

## 2012-01-02 DIAGNOSIS — I10 Essential (primary) hypertension: Secondary | ICD-10-CM | POA: Insufficient documentation

## 2012-01-02 DIAGNOSIS — D649 Anemia, unspecified: Secondary | ICD-10-CM | POA: Insufficient documentation

## 2012-01-02 NOTE — Progress Notes (Signed)
Subjective:   HPI  [name] is a 36 y.o. female who presents for multiple issues.  She notes that she just had her son in November, pregnancy without complication.  Shortly after the delivery, home health nurse saw her BP was 170s/90s, and she had labs done through hospital and also at gynecologist which were normal except hemoglobin.  She was started on Labetalol 100mg  BID, then went to 200mg  BID thereafter since not controlled.  She had gastric bypass revision since her pregnancy, due to ulcers.  BPs were elevated the whole time she was in hospital for the procedure. She notes that her BPs been running 140-170s/80-90s.  She notes hx/o heart murmur, echocardiogram 2005 normal, but no hx/o heart disease.  She has hx/o anemia, and she recently iron for the anemia.  She notes having negative guaiac 9mo ago through surgeon that performed her gastric bypass redo.  She has been having some headaches, more in the afternoons and evenings.  Occasionally gets water eyes.  Not sure of the headache trigger.  She denies current allergy or sinus problems, no worse stress that usual, no hx/o migraine, no neurological changes, not fasting, no recent changes in caffeine consumption.  No other c/o.  The following portions of the patient's history were reviewed and updated as appropriate: allergies, current medications, past family history, past medical history, past social history, past surgical history and problem list.  Past Medical History  Diagnosis Date  . GERD (gastroesophageal reflux disease)   . IBS (irritable bowel syndrome)   . Incisional hernia   . Ulcer   . Anemia   . Headache   . Hypertension   . ADHD (attention deficit hyperactivity disorder)   . Depression     No Known Allergies  Current Outpatient Prescriptions on File Prior to Visit  Medication Sig Dispense Refill  . acetaminophen (TYLENOL) 500 MG tablet Take 1,000 mg by mouth every 6 (six) hours as needed. For pain      .  buPROPion (WELLBUTRIN XL) 150 MG 24 hr tablet Take 150 mg by mouth daily before breakfast.       . calcium citrate-vitamin D 200-200 MG-UNIT TABS Take 1 tablet by mouth daily. 1000 mg liquid      . CALCIUM-VITAMIN D PO Take 1 tablet by mouth daily.      . citalopram (CELEXA) 40 MG tablet 40 mg every morning.       . cyanocobalamin 500 MCG tablet Take 500 mcg by mouth daily.      Marland Kitchen docusate sodium (COLACE) 100 MG capsule Take 100 mg by mouth daily as needed. constipation      . esomeprazole (NEXIUM) 40 MG capsule Take 40 mg by mouth 2 (two) times daily.       . ferrous sulfate 325 (65 FE) MG tablet Take by mouth daily with breakfast. Carbonyl Iron (45mg )      . lisdexamfetamine (VYVANSE) 70 MG capsule Take 140 mg by mouth daily before breakfast.       . prenatal vitamin w/FE, FA (PRENATAL 1 + 1) 27-1 MG TABS Take 1 tablet by mouth daily.       Marland Kitchen oxyCODONE-acetaminophen (ROXICET) 5-325 MG/5ML solution Take 5-10 mLs by mouth every 4 (four) hours as needed.  300 mL  0  . traZODone (DESYREL) 50 MG tablet Take 50 mg by mouth at bedtime.          Review of Systems Constitutional: denies fever, chills, sweats, unexpected weight change, fatigue ENT: no  runny nose, ear pain, sore throat Cardiology: denies chest pain, palpitations, edema Respiratory: denies cough, shortness of breath, wheezing Gastroenterology: denies abdominal pain, nausea, vomiting, diarrhea, constipation  Hematology: denies bleeding, but easily bruising Musculoskeletal: denies arthralgias, myalgias, joint swelling, back pain Ophthalmology: denies vision changes Urology: denies dysuria, difficulty urinating, hematuria, urinary frequency, urgency Neurology: no weakness, tingling, numbness      Objective:   Physical Exam  General appearance: alert, no distress, WD/WN HEENT: normocephalic, sclerae anicteric, TMs pearly, nares patent, no discharge or erythema, pharynx normal Oral cavity: MMM, no lesions Neck: supple, no  lymphadenopathy, no thyromegaly, no masses, no bruits Heart: RRR, normal S1, S2, no murmurs Lungs: CTA bilaterally, no wheezes, rhonchi, or rales Abdomen: +bs, soft, non tender, non distended, no masses, no hepatomegaly, no splenomegaly Pulses: 2+ symmetric, upper and lower extremities, normal cap refill Neuro: CN2-12 intact, nonfocal  Assessment and plan:  Encounter Diagnoses  Name Primary?  . Essential hypertension, benign Yes  . Anemia   . Headache   . S/P gastric bypass    HTN - normal to low normal readings today in office by me and nurse.  Advised she c/t same medications for now, recheck 2 wk.  She will monitor home BP readings as well.  Anemia - i reviewed recent CBC values, advised she return for CBC [redacted]wk along with iron and other studies  Headache - etiology unclear.  Advised she keep headache diary, recheck 2 wk.  S/P gastric bypass - seems to be doing well in this regard.  Abdominal incisions healing appropriately, nontender abdomen, no fever, just went to solid foods by mouth, and is on Nexium.

## 2012-01-03 DIAGNOSIS — R51 Headache: Secondary | ICD-10-CM | POA: Insufficient documentation

## 2012-01-03 DIAGNOSIS — R519 Headache, unspecified: Secondary | ICD-10-CM | POA: Insufficient documentation

## 2012-01-03 DIAGNOSIS — Z9884 Bariatric surgery status: Secondary | ICD-10-CM | POA: Insufficient documentation

## 2012-01-07 ENCOUNTER — Telehealth: Payer: Self-pay | Admitting: Family Medicine

## 2012-01-07 NOTE — Telephone Encounter (Signed)
Lmom for the patient to come in a week early to have her labwork done. I ask to please call back and schedule a lab appointment to have her labs drawn. Labs to be drawn are already in the system. CLS

## 2012-01-07 NOTE — Telephone Encounter (Signed)
Message copied by Janeice Robinson on Wed Jan 07, 2012 11:35 AM ------      Message from: Jac Canavan      Created: Sat Jan 03, 2012  6:28 AM       pls call her back and ask her to have labs in 1wk instead of 2.  I have the labs standing order in the computer.  I'm not sure, but she may be able to have them done at her place of work.  If not, can come here as usual for lab.  Check her BP again when she comes in.

## 2012-01-13 ENCOUNTER — Encounter (HOSPITAL_COMMUNITY): Payer: Self-pay | Admitting: Surgery

## 2012-01-15 ENCOUNTER — Encounter (INDEPENDENT_AMBULATORY_CARE_PROVIDER_SITE_OTHER): Payer: Self-pay | Admitting: General Surgery

## 2012-01-15 ENCOUNTER — Encounter (INDEPENDENT_AMBULATORY_CARE_PROVIDER_SITE_OTHER): Payer: Self-pay | Admitting: Surgery

## 2012-01-15 ENCOUNTER — Ambulatory Visit (INDEPENDENT_AMBULATORY_CARE_PROVIDER_SITE_OTHER): Payer: Commercial Managed Care - PPO | Admitting: Surgery

## 2012-01-15 VITALS — BP 112/78 | HR 84 | Temp 97.6°F | Resp 16 | Ht 67.0 in | Wt 169.0 lb

## 2012-01-15 DIAGNOSIS — Z9884 Bariatric surgery status: Secondary | ICD-10-CM

## 2012-01-15 NOTE — Progress Notes (Signed)
[name] comes in today he is doing very well after her Y. Gastric bypass revision done January 28. [name] 36 y.o.  Body mass index is 26.47 kg/(m^2).  Patient Active Problem List  Diagnoses  . DEPRESSION  . COUGH  . Incisional hernia without mention of obstruction or gangrene  . SVD (spontaneous vaginal delivery)  . Lap Bypass 2005 [facility]-comp by 2 perforations  . Essential hypertension, benign  . Anemia  . Headache  . S/P gastric bypass    No Known Allergies  Past Surgical History  Procedure Date  . Laparoscopic gastrotomy w/ repair of ulcer 10/23/09  . Roux-en-y gastric bypass 2005  . Lap repair gastrojejunal anastomotic perforation 02/21/11    Dr Andrey Campanile  . Esophagogastroduodenoscopy 11/28/2011    Procedure: ESOPHAGOGASTRODUODENOSCOPY (EGD);  Surgeon: Theda Belfast, MD;  Location: [facility];  Service: Endoscopy;  Laterality: N/A;  . Cholecystectomy 2006  . Ventral hernia repair 12/22/2011    Procedure: HERNIA REPAIR VENTRAL ADULT;  Surgeon: Valarie Merino, MD;  Location: [facility];  Service: Candace Cruise, MD, MD No diagnosis found.  The only problem she is having some heartburn. She is still taking her PPI. She's not had any nausea or vomiting and she does have some constipation. She will return to work on February 28. I'll see her back in about 6 weeks Matt B. Daphine Deutscher, MD, [facility] [phone] beeper [phone]  01/15/2012 10:55 AM

## 2012-01-16 ENCOUNTER — Ambulatory Visit: Payer: 59 | Admitting: Medical

## 2012-01-27 ENCOUNTER — Ambulatory Visit (HOSPITAL_COMMUNITY): Payer: Self-pay | Admitting: Physician Assistant

## 2012-01-27 ENCOUNTER — Other Ambulatory Visit (HOSPITAL_COMMUNITY): Payer: Self-pay | Admitting: *Deleted

## 2012-01-27 DIAGNOSIS — F902 Attention-deficit hyperactivity disorder, combined type: Secondary | ICD-10-CM

## 2012-01-27 DIAGNOSIS — F988 Other specified behavioral and emotional disorders with onset usually occurring in childhood and adolescence: Secondary | ICD-10-CM

## 2012-01-27 DIAGNOSIS — F329 Major depressive disorder, single episode, unspecified: Secondary | ICD-10-CM

## 2012-01-27 DIAGNOSIS — F3289 Other specified depressive episodes: Secondary | ICD-10-CM

## 2012-01-27 MED ORDER — LISDEXAMFETAMINE DIMESYLATE 70 MG PO CAPS: 140.0000 mg | ORAL_CAPSULE | Freq: Every day | ORAL | Status: DC

## 2012-01-27 NOTE — Progress Notes (Signed)
   [facility] Behavioral Health Follow-up Outpatient Visit  [name] Mar 26, 1976  Date: 01/27/12   Subjective: [name] presents today to followup on her medications prescribed for ADHD and depression. She reports that she lost her position at [employer] because she had to be out of work for a revision gastric bypass surgery, and she had used all her FMLA time while out on maternity leave. She reports that, all in all, she is taking it in stride. She has hopes for other positions within the [employer] system, as she is still considered to be an employee. She has 30 days to secure position. She denies any new problems status post partum. She reports that she is sleeping and eating well. She denies any suicidal or homicidal ideation. She denies any auditory or visual hallucinations.   There were no vitals filed for this visit.  Mental Status Examination  Appearance: Well groomed and casually dressed Alert: Yes Attention: good  Cooperative: Yes Eye Contact: Good Speech: Clear and even Psychomotor Activity: Normal Memory/Concentration: Intact Oriented: person, place, time/date and situation Mood: Euthymic Affect: Appropriate Thought Processes and Associations: Goal Directed and Linear Fund of Knowledge: Good Thought Content:  Insight: Good Judgement: Good  Diagnosis: ADHD, inattentive type; depressive disorder NOS.  Treatment Plan: We will continue her medications as prescribed - Wellbutrin XL 150 mg daily, Celexa 40 mg daily, trazodone 50 mg at bedtime, and Vyvanse 140 mg daily.  Christine Yokum, PA

## 2012-03-09 ENCOUNTER — Encounter (INDEPENDENT_AMBULATORY_CARE_PROVIDER_SITE_OTHER): Payer: Self-pay | Admitting: Surgery

## 2012-03-15 ENCOUNTER — Other Ambulatory Visit (HOSPITAL_COMMUNITY): Payer: Self-pay | Admitting: Physician Assistant

## 2012-03-17 ENCOUNTER — Ambulatory Visit (INDEPENDENT_AMBULATORY_CARE_PROVIDER_SITE_OTHER): Payer: Commercial Managed Care - PPO | Admitting: Surgery

## 2012-03-24 ENCOUNTER — Encounter (INDEPENDENT_AMBULATORY_CARE_PROVIDER_SITE_OTHER): Payer: Self-pay | Admitting: Surgery

## 2012-05-03 ENCOUNTER — Ambulatory Visit (INDEPENDENT_AMBULATORY_CARE_PROVIDER_SITE_OTHER): Payer: Managed Care, Other (non HMO) | Admitting: Physician Assistant

## 2012-05-03 DIAGNOSIS — F902 Attention-deficit hyperactivity disorder, combined type: Secondary | ICD-10-CM

## 2012-05-03 DIAGNOSIS — F330 Major depressive disorder, recurrent, mild: Secondary | ICD-10-CM

## 2012-05-03 DIAGNOSIS — F909 Attention-deficit hyperactivity disorder, unspecified type: Secondary | ICD-10-CM

## 2012-05-03 MED ORDER — LISDEXAMFETAMINE DIMESYLATE 70 MG PO CAPS: 140.0000 mg | ORAL_CAPSULE | Freq: Every day | ORAL | Status: DC

## 2012-05-03 MED ORDER — LISDEXAMFETAMINE DIMESYLATE 70 MG PO CAPS: 140.0000 mg | ORAL_CAPSULE | ORAL | Status: DC

## 2012-05-03 NOTE — Progress Notes (Signed)
   [facility] Behavioral Health Follow-up Outpatient Visit  KEILEE DENMAN May 26, 1976  Date: 05/03/2012   Subjective: Adriella presents today to followup on medications prescribed for ADHD and depression. She reports that overall she is doing well. She is still not employed, but is spending a significant amount of time with her children at home. She does express some feelings of being overwhelmed at times with being a full-time mom. She is somewhat sleep deprived as she is unable to sleep but 3 hours at a time due to the infant in the home. He also has not started taking the bottle, so she must breast-feed each time. She feels her medications are appropriately dosed, and sees no need for change. She denies any suicidal or homicidal ideation. She denies any auditory or visual hallucinations.  There were no vitals filed for this visit.  Mental Status Examination  Appearance: Well groomed and casually dressed Alert: Yes Attention: good  Cooperative: Yes Eye Contact: Good Speech: Clear and even Psychomotor Activity: Normal Memory/Concentration: Intact Oriented: person, place and time/date Mood: Euthymic Affect: Appropriate Thought Processes and Associations: Linear Fund of Knowledge: Good Thought Content: Normal Insight: Good Judgement: Good  Diagnosis: ADHD, major depressive disorder recurrent mild  Treatment Plan: We will continue her Vyvanse at 140 mg daily, Wellbutrin XL 150 mg daily, Celexa 40 mg daily, and trazodone 50 mg at bedtime. She will return for followup in 6 months. She can call for prescriptions of Vyvanse when she needs them.  Jilda Kress, PA-C

## 2012-05-04 DIAGNOSIS — F902 Attention-deficit hyperactivity disorder, combined type: Secondary | ICD-10-CM | POA: Insufficient documentation

## 2012-05-24 ENCOUNTER — Other Ambulatory Visit (HOSPITAL_COMMUNITY): Payer: Self-pay | Admitting: *Deleted

## 2012-05-24 DIAGNOSIS — F909 Attention-deficit hyperactivity disorder, unspecified type: Secondary | ICD-10-CM

## 2012-05-24 MED ORDER — CITALOPRAM HYDROBROMIDE 40 MG PO TABS: 40.0000 mg | ORAL_TABLET | ORAL | Status: DC

## 2012-05-28 ENCOUNTER — Other Ambulatory Visit (HOSPITAL_COMMUNITY): Payer: Self-pay | Admitting: Psychology

## 2012-06-01 ENCOUNTER — Other Ambulatory Visit (HOSPITAL_COMMUNITY): Payer: Self-pay | Admitting: *Deleted

## 2012-07-20 ENCOUNTER — Other Ambulatory Visit (HOSPITAL_COMMUNITY): Payer: Self-pay | Admitting: *Deleted

## 2012-07-20 DIAGNOSIS — F902 Attention-deficit hyperactivity disorder, combined type: Secondary | ICD-10-CM

## 2012-07-20 MED ORDER — LISDEXAMFETAMINE DIMESYLATE 70 MG PO CAPS: 140.0000 mg | ORAL_CAPSULE | ORAL | Status: DC

## 2012-07-20 NOTE — Telephone Encounter (Signed)
Vyvanse refill 

## 2012-07-28 ENCOUNTER — Telehealth (HOSPITAL_COMMUNITY): Payer: Self-pay

## 2012-07-28 NOTE — Telephone Encounter (Signed)
07/28/12 12:45pm Pt's mother came by and pick-up rx./sh

## 2012-08-10 ENCOUNTER — Other Ambulatory Visit (HOSPITAL_COMMUNITY): Payer: Self-pay | Admitting: *Deleted

## 2012-08-10 DIAGNOSIS — F909 Attention-deficit hyperactivity disorder, unspecified type: Secondary | ICD-10-CM

## 2012-08-10 MED ORDER — CITALOPRAM HYDROBROMIDE 40 MG PO TABS: 40.0000 mg | ORAL_TABLET | ORAL | Status: DC

## 2012-08-17 ENCOUNTER — Other Ambulatory Visit (HOSPITAL_COMMUNITY): Payer: Self-pay | Admitting: *Deleted

## 2012-08-17 DIAGNOSIS — F909 Attention-deficit hyperactivity disorder, unspecified type: Secondary | ICD-10-CM

## 2012-08-17 MED ORDER — CITALOPRAM HYDROBROMIDE 40 MG PO TABS: 40.0000 mg | ORAL_TABLET | ORAL | Status: DC

## 2012-08-17 NOTE — Telephone Encounter (Signed)
Received faxed refill request marked "Second Request" for Celexa. Refill had been sent  in error to [facility] on 9/17. [facility] contacted.They will transfer RX to [facility]

## 2012-09-13 ENCOUNTER — Other Ambulatory Visit (HOSPITAL_COMMUNITY): Payer: Self-pay | Admitting: *Deleted

## 2012-09-13 ENCOUNTER — Telehealth (HOSPITAL_COMMUNITY): Payer: Self-pay | Admitting: *Deleted

## 2012-09-13 DIAGNOSIS — F909 Attention-deficit hyperactivity disorder, unspecified type: Secondary | ICD-10-CM

## 2012-09-13 MED ORDER — CITALOPRAM HYDROBROMIDE 40 MG PO TABS: 40.0000 mg | ORAL_TABLET | ORAL | Status: DC

## 2012-09-13 MED ORDER — TRAZODONE HCL 50 MG PO TABS: 50.0000 mg | ORAL_TABLET | Freq: Every day | ORAL | Status: DC

## 2012-09-13 NOTE — Telephone Encounter (Signed)
Trazodone refilled as requested.

## 2012-10-28 ENCOUNTER — Other Ambulatory Visit (HOSPITAL_COMMUNITY): Payer: Self-pay | Admitting: *Deleted

## 2012-10-28 DIAGNOSIS — F902 Attention-deficit hyperactivity disorder, combined type: Secondary | ICD-10-CM

## 2012-10-28 MED ORDER — LISDEXAMFETAMINE DIMESYLATE 70 MG PO CAPS: 140.0000 mg | ORAL_CAPSULE | ORAL | Status: DC

## 2012-10-29 ENCOUNTER — Telehealth (HOSPITAL_COMMUNITY): Payer: Self-pay

## 2012-10-29 NOTE — Telephone Encounter (Signed)
1:48PM 10/29/12 PT CAME AND PICK-UP RX SCRIPT./SH

## 2012-11-03 ENCOUNTER — Ambulatory Visit (INDEPENDENT_AMBULATORY_CARE_PROVIDER_SITE_OTHER): Payer: 59 | Admitting: Physician Assistant

## 2012-11-03 ENCOUNTER — Other Ambulatory Visit (HOSPITAL_COMMUNITY): Payer: Self-pay | Admitting: *Deleted

## 2012-11-03 DIAGNOSIS — F902 Attention-deficit hyperactivity disorder, combined type: Secondary | ICD-10-CM

## 2012-11-03 DIAGNOSIS — F909 Attention-deficit hyperactivity disorder, unspecified type: Secondary | ICD-10-CM

## 2012-11-03 DIAGNOSIS — F330 Major depressive disorder, recurrent, mild: Secondary | ICD-10-CM

## 2012-11-03 MED ORDER — LISDEXAMFETAMINE DIMESYLATE 70 MG PO CAPS: 140.0000 mg | ORAL_CAPSULE | ORAL | Status: DC

## 2012-11-03 MED ORDER — TRAZODONE HCL 50 MG PO TABS: ORAL_TABLET | ORAL | Status: DC

## 2012-11-03 MED ORDER — BUPROPION HCL ER (XL) 150 MG PO TB24: 150.0000 mg | ORAL_TABLET | Freq: Every day | ORAL | Status: DC

## 2012-11-03 MED ORDER — CITALOPRAM HYDROBROMIDE 40 MG PO TABS: 40.0000 mg | ORAL_TABLET | ORAL | Status: DC

## 2012-11-03 NOTE — Progress Notes (Signed)
   [facility] Behavioral Health Follow-up Outpatient Visit  [name] Dec 14, 1975  Date: 11/03/2012   Subjective: [name] presents today to followup on her treatment for ADHD and depression. 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 She has been there for 2 weeks, and enjoys it. She is working weekends days. Her 38 year old son is acting up some, which is causing stress in her life. He has been sneaking out and has been smoking marijuana. Her infant son recently turned one year. She reports she is sleeping well, and her appetite is good. Her mood has been stable and she denies any suicidal or homicidal ideation. She denies any auditory or visual hallucinations.  There were no vitals filed for this visit.  Mental Status Examination  Appearance: Well groomed and casually dressed Alert: Yes Attention: good  Cooperative: Yes Eye Contact: Good Speech: Clear and coherent Psychomotor Activity: Normal Memory/Concentration: Intact Oriented: person, place, time/date and situation Mood: Euthymic Affect: Congruent Thought Processes and Associations: Linear Fund of Knowledge: Good Thought Content: Normal Insight: Good Judgement: Good  Diagnosis: Maj. depressive disorder, recurrent, mild; ADHD, combined type  Treatment Plan: We will continue Vyvanse 140 mg daily, Wellbutrin XL 150 mg daily, Celexa 40 mg at bedtime, and trazodone 50-100 mg at bedtime. She will return for followup in 3 months.  [name], PA-C

## 2012-12-03 ENCOUNTER — Other Ambulatory Visit (HOSPITAL_COMMUNITY): Payer: Self-pay | Admitting: Physician Assistant

## 2012-12-03 DIAGNOSIS — F988 Other specified behavioral and emotional disorders with onset usually occurring in childhood and adolescence: Secondary | ICD-10-CM

## 2012-12-03 MED ORDER — AMPHETAMINE-DEXTROAMPHET ER 30 MG PO CP24: 30.0000 mg | ORAL_CAPSULE | Freq: Two times a day (BID) | ORAL | Status: DC

## 2012-12-03 NOTE — Telephone Encounter (Signed)
Insurance refusing to cover Vyvanse unless tried 3 other generic stimulants.  Will give a trial of Adderall XR

## 2013-01-11 ENCOUNTER — Telehealth: Payer: Self-pay | Admitting: Family Medicine

## 2013-01-11 DIAGNOSIS — K429 Umbilical hernia without obstruction or gangrene: Secondary | ICD-10-CM | POA: Insufficient documentation

## 2013-01-11 DIAGNOSIS — K255 Chronic or unspecified gastric ulcer with perforation: Secondary | ICD-10-CM | POA: Insufficient documentation

## 2013-01-11 NOTE — Telephone Encounter (Signed)
Message copied by Janeice Robinson on Tue Jan 11, 2013  3:18 PM ------      Message from: Jac Canavan      Created: Mon Jan 10, 2013  8:32 AM       Due for yearly f/u/CPX I believe.   pls schedule ------

## 2013-01-11 NOTE — Telephone Encounter (Signed)
I called and I left a message on the patients voice mail about setting up a physical appointment. CLS

## 2013-01-26 ENCOUNTER — Other Ambulatory Visit (HOSPITAL_COMMUNITY): Payer: Self-pay | Admitting: *Deleted

## 2013-01-26 DIAGNOSIS — F902 Attention-deficit hyperactivity disorder, combined type: Secondary | ICD-10-CM

## 2013-01-26 MED ORDER — LISDEXAMFETAMINE DIMESYLATE 70 MG PO CAPS: 140.0000 mg | ORAL_CAPSULE | ORAL | Status: DC

## 2013-01-31 ENCOUNTER — Ambulatory Visit (HOSPITAL_COMMUNITY): Payer: Self-pay | Admitting: Physician Assistant

## 2013-03-30 ENCOUNTER — Ambulatory Visit (INDEPENDENT_AMBULATORY_CARE_PROVIDER_SITE_OTHER): Payer: 59 | Admitting: Physician Assistant

## 2013-03-30 ENCOUNTER — Other Ambulatory Visit (HOSPITAL_COMMUNITY): Payer: Self-pay | Admitting: *Deleted

## 2013-03-30 VITALS — BP 121/90 | HR 88 | Ht 67.0 in | Wt 151.8 lb

## 2013-03-30 DIAGNOSIS — F330 Major depressive disorder, recurrent, mild: Secondary | ICD-10-CM

## 2013-03-30 DIAGNOSIS — F988 Other specified behavioral and emotional disorders with onset usually occurring in childhood and adolescence: Secondary | ICD-10-CM

## 2013-03-30 DIAGNOSIS — F902 Attention-deficit hyperactivity disorder, combined type: Secondary | ICD-10-CM

## 2013-03-30 MED ORDER — LISDEXAMFETAMINE DIMESYLATE 70 MG PO CAPS: 140.0000 mg | ORAL_CAPSULE | ORAL | Status: DC

## 2013-03-31 ENCOUNTER — Encounter (HOSPITAL_COMMUNITY): Payer: Self-pay | Admitting: Physician Assistant

## 2013-03-31 NOTE — Progress Notes (Signed)
   [facility] Behavioral Health Follow-up Outpatient Visit  [name] 10-Jan-1976  Date: 03/31/2013   Subjective: Delmar presents today to followup on her treatment for depression and ADHD. She reports that she things are going well overall. Her son, Kenard Gower, and will be graduating high school next month. She continues to sneak out at night and smoked marijuana. He has made some mention to her that he feels he may have a problem and would like to get help. 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 She has been diagnosed as anemic, and has started taking iron supplements and vitamin D supplements. She reports that her mood has been stable. She feels that her ADHD is well managed. She denies any suicidal or homicidal ideation. She denies any auditory or visual hallucinations.  Filed Vitals:   03/30/13 1403  BP: 121/90  Pulse: 88    Mental Status Examination  Appearance: Casual Alert: Yes Attention: good  Cooperative: Yes Eye Contact: Good Speech: Clear and coherent Psychomotor Activity: Normal Memory/Concentration: Intact Oriented: person, place, time/date and situation Mood: Euthymic Affect: Appropriate Thought Processes and Associations: Linear Fund of Knowledge: Good Thought Content: Normal Insight: Good Judgement: Good  Diagnosis: ADHD, inattentive type; major depressive disorder, recurrent, mild  Treatment Plan: We will continue her Vyvanse 140 mg daily, Wellbutrin XL 150 mg daily, Celexa 40 mg at bedtime, and trazodone 5200 mg at bedtime. She will return for followup in 3 months.  Dmario Russom, PA-C

## 2013-06-15 DIAGNOSIS — D509 Iron deficiency anemia, unspecified: Secondary | ICD-10-CM | POA: Insufficient documentation

## 2013-06-30 ENCOUNTER — Ambulatory Visit (INDEPENDENT_AMBULATORY_CARE_PROVIDER_SITE_OTHER): Payer: Managed Care, Other (non HMO) | Admitting: Physician Assistant

## 2013-06-30 ENCOUNTER — Encounter (HOSPITAL_COMMUNITY): Payer: Self-pay | Admitting: Physician Assistant

## 2013-06-30 VITALS — BP 133/92 | HR 84 | Ht 67.0 in | Wt 159.0 lb

## 2013-06-30 DIAGNOSIS — F902 Attention-deficit hyperactivity disorder, combined type: Secondary | ICD-10-CM

## 2013-06-30 DIAGNOSIS — F909 Attention-deficit hyperactivity disorder, unspecified type: Secondary | ICD-10-CM

## 2013-06-30 DIAGNOSIS — F330 Major depressive disorder, recurrent, mild: Secondary | ICD-10-CM

## 2013-06-30 MED ORDER — LISDEXAMFETAMINE DIMESYLATE 70 MG PO CAPS: 140.0000 mg | ORAL_CAPSULE | ORAL | Status: DC

## 2013-06-30 NOTE — Progress Notes (Signed)
   [facility] Behavioral Health Follow-up Outpatient Visit  [name] May 29, 1976  Date: 06/30/2013   Subjective: [name] presents today to followup on her treatment for depression and ADHD. She reports that she has occasional "down times" that seemed to be situational and last for a few days. She reports that she finds that she needs time to get away and be alone on occasion, and that usually resolves the problem. She reports that her son Kenard Gower has graduated from high school and plans to go to [employer] college this fall. He is still smoking marijuana. Her stepson is moving in, and he will be attending [employer]. 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 She denies any suicidal or homicidal ideation. She denies any auditory or visual hallucinations.  Filed Vitals:   06/30/13 1433  BP: 133/92  Pulse: 84    Mental Status Examination  Appearance: Casual Alert: Yes Attention: good  Cooperative: Yes Eye Contact: Good Speech: Clear and coherent Psychomotor Activity: Normal Memory/Concentration: Intact Oriented: person, place, time/date and situation Mood: Euthymic Affect: Appropriate Thought Processes and Associations: Linear Fund of Knowledge: Good Thought Content: Normal Insight: Good Judgement: Good  Diagnosis: ADHD, inattentive type; major depressive disorder, recurrent, mild  Treatment Plan: We will continue her Vyvanse 140 mg daily, Wellbutrin XL 150 mg daily, Celexa 40 mg at bedtime, and trazodone 50-100 mg at bedtime. She will return for followup in 3 months.  Preston Garabedian, PA-C

## 2013-07-01 ENCOUNTER — Telehealth (HOSPITAL_COMMUNITY): Payer: Self-pay

## 2013-07-01 ENCOUNTER — Encounter (HOSPITAL_COMMUNITY): Payer: Self-pay

## 2013-07-01 NOTE — Telephone Encounter (Signed)
07/01/13 9:34am Called and left msg that a letter has been mailed with the activation code.Marland KitchenMarguerite Olea

## 2013-07-25 DIAGNOSIS — G43909 Migraine, unspecified, not intractable, without status migrainosus: Secondary | ICD-10-CM | POA: Insufficient documentation

## 2013-09-15 DIAGNOSIS — E559 Vitamin D deficiency, unspecified: Secondary | ICD-10-CM | POA: Insufficient documentation

## 2013-10-10 ENCOUNTER — Ambulatory Visit (HOSPITAL_COMMUNITY): Payer: Self-pay | Admitting: Physician Assistant

## 2013-11-24 HISTORY — PX: DILITATION & CURRETTAGE/HYSTROSCOPY WITH ESSURE: SHX5573

## 2014-02-22 ENCOUNTER — Ambulatory Visit (HOSPITAL_COMMUNITY): Payer: Self-pay | Admitting: Psychiatry

## 2014-09-25 ENCOUNTER — Encounter (HOSPITAL_COMMUNITY): Payer: Self-pay | Admitting: Physician Assistant

## 2015-07-18 DIAGNOSIS — F419 Anxiety disorder, unspecified: Secondary | ICD-10-CM | POA: Insufficient documentation

## 2015-07-18 DIAGNOSIS — F3341 Major depressive disorder, recurrent, in partial remission: Secondary | ICD-10-CM | POA: Insufficient documentation

## 2016-09-18 ENCOUNTER — Encounter (HOSPITAL_COMMUNITY): Payer: Self-pay

## 2017-09-21 ENCOUNTER — Encounter (HOSPITAL_COMMUNITY): Payer: Self-pay

## 2020-08-03 LAB — EXTERNAL GENERIC LAB PROCEDURE: COLOGUARD: NEGATIVE

## 2020-08-03 LAB — COLOGUARD: COLOGUARD: NEGATIVE

## 2021-11-24 HISTORY — PX: COLONOSCOPY WITH PROPOFOL: SHX5780

## 2022-04-26 DIAGNOSIS — R1013 Epigastric pain: Secondary | ICD-10-CM | POA: Insufficient documentation

## 2022-05-23 DIAGNOSIS — K561 Intussusception: Secondary | ICD-10-CM | POA: Insufficient documentation

## 2022-06-05 DIAGNOSIS — E669 Obesity, unspecified: Secondary | ICD-10-CM | POA: Insufficient documentation

## 2022-06-05 HISTORY — PX: BOWEL RESECTION: SHX1257

## 2022-06-05 HISTORY — PX: XI ROBOTIC ASSISTED SMALL BOWEL RESECTION: SHX6872

## 2022-06-05 HISTORY — PX: SMALL BOWEL REPAIR: SHX6447

## 2022-08-22 ENCOUNTER — Ambulatory Visit: Payer: 59 | Admitting: Podiatry

## 2022-08-22 ENCOUNTER — Encounter: Payer: Self-pay | Admitting: Podiatry

## 2022-08-22 ENCOUNTER — Ambulatory Visit (INDEPENDENT_AMBULATORY_CARE_PROVIDER_SITE_OTHER): Payer: 59

## 2022-08-22 ENCOUNTER — Other Ambulatory Visit (HOSPITAL_BASED_OUTPATIENT_CLINIC_OR_DEPARTMENT_OTHER): Payer: Self-pay

## 2022-08-22 DIAGNOSIS — M722 Plantar fascial fibromatosis: Secondary | ICD-10-CM | POA: Diagnosis not present

## 2022-08-22 MED ORDER — PRAMIPEXOLE DIHYDROCHLORIDE 1.5 MG PO TABS
1.5000 mg | ORAL_TABLET | Freq: Every day | ORAL | 3 refills | Status: DC
  Filled 2022-08-22 – 2022-09-01 (×2): qty 30, 30d supply, fill #0
  Filled 2022-10-03: qty 30, 30d supply, fill #1
  Filled 2022-10-27: qty 30, 30d supply, fill #2

## 2022-08-22 MED ORDER — TRAZODONE HCL 100 MG PO TABS
100.0000 mg | ORAL_TABLET | Freq: Every evening | ORAL | 2 refills | Status: DC | PRN
  Filled 2022-08-22 – 2022-09-01 (×2): qty 60, 30d supply, fill #0
  Filled 2022-10-03: qty 60, 30d supply, fill #1
  Filled 2022-10-27: qty 60, 30d supply, fill #2

## 2022-08-22 MED ORDER — TRIAMCINOLONE ACETONIDE 10 MG/ML IJ SUSP
10.0000 mg | Freq: Once | INTRAMUSCULAR | Status: AC
  Administered 2022-08-22: 10 mg

## 2022-08-22 MED ORDER — AMPHETAMINE-DEXTROAMPHETAMINE 20 MG PO TABS
20.0000 mg | ORAL_TABLET | Freq: Two times a day (BID) | ORAL | 0 refills | Status: DC
  Filled 2022-08-22 – 2022-08-28 (×2): qty 60, 30d supply, fill #0

## 2022-08-22 MED ORDER — AMPHETAMINE-DEXTROAMPHETAMINE 20 MG PO TABS
20.0000 mg | ORAL_TABLET | Freq: Two times a day (BID) | ORAL | 0 refills | Status: DC
  Filled 2022-10-17 – 2022-10-20 (×2): qty 60, 30d supply, fill #0
  Filled 2022-10-24: qty 52, 26d supply, fill #0
  Filled 2022-10-24: qty 8, 4d supply, fill #0

## 2022-08-22 MED ORDER — DULOXETINE HCL 30 MG PO CPEP
90.0000 mg | ORAL_CAPSULE | Freq: Every day | ORAL | 3 refills | Status: DC
  Filled 2022-08-22: qty 90, 30d supply, fill #0
  Filled 2022-10-17: qty 90, 30d supply, fill #1

## 2022-08-22 MED ORDER — BUSPIRONE HCL 30 MG PO TABS
30.0000 mg | ORAL_TABLET | Freq: Two times a day (BID) | ORAL | 3 refills | Status: DC
  Filled 2022-08-22: qty 60, 30d supply, fill #0

## 2022-08-22 MED ORDER — LORAZEPAM 1 MG PO TABS
1.0000 mg | ORAL_TABLET | Freq: Two times a day (BID) | ORAL | 3 refills | Status: DC | PRN
  Filled 2022-08-22 – 2022-08-28 (×4): qty 60, 30d supply, fill #0
  Filled 2022-09-25: qty 60, 30d supply, fill #1
  Filled 2022-10-17 – 2022-10-24 (×2): qty 60, 30d supply, fill #2

## 2022-08-22 MED ORDER — AMPHETAMINE-DEXTROAMPHETAMINE 20 MG PO TABS
20.0000 mg | ORAL_TABLET | Freq: Two times a day (BID) | ORAL | 0 refills | Status: DC
  Filled 2022-08-22 – 2022-09-25 (×3): qty 60, 30d supply, fill #0

## 2022-08-22 NOTE — Progress Notes (Signed)
Subjective:   Patient ID: [name], female   DOB: 46 y.o.   MRN: [id number]   HPI Patient presents with severe pain in the heel left of several months duration with history of right also that has settled down to a degree.  It is worse when she gets up and it always aches and then gets sharp.  Patient does not smoke likes to be active   Review of Systems  All other systems reviewed and are negative.       Objective:  Physical Exam Vitals and nursing note reviewed.  Constitutional:      Appearance: She is well-developed.  Pulmonary:     Effort: Pulmonary effort is normal.  Musculoskeletal:        General: Normal range of motion.  Skin:    General: Skin is warm.  Neurological:     Mental Status: She is alert.     Neurovascular status found to be intact muscle strength found to be adequate range of motion adequate with exquisite discomfort in the medial band of the left plantar fascia at the insertion of the tendon into the calcaneus with fluid buildup and noted to have high arch cavus foot structure which is contributory to problems along with moderate deformity and discomfort on the right.  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  Reappoint 2 weeks to recheck  X-rays indicate marked cavus foot structure bilateral spur formation left over right

## 2022-08-22 NOTE — Patient Instructions (Signed)

## 2022-08-23 ENCOUNTER — Other Ambulatory Visit (HOSPITAL_BASED_OUTPATIENT_CLINIC_OR_DEPARTMENT_OTHER): Payer: Self-pay

## 2022-08-25 ENCOUNTER — Other Ambulatory Visit (HOSPITAL_BASED_OUTPATIENT_CLINIC_OR_DEPARTMENT_OTHER): Payer: Self-pay

## 2022-08-28 ENCOUNTER — Other Ambulatory Visit (HOSPITAL_BASED_OUTPATIENT_CLINIC_OR_DEPARTMENT_OTHER): Payer: Self-pay

## 2022-09-01 ENCOUNTER — Other Ambulatory Visit (HOSPITAL_BASED_OUTPATIENT_CLINIC_OR_DEPARTMENT_OTHER): Payer: Self-pay

## 2022-09-02 ENCOUNTER — Other Ambulatory Visit (HOSPITAL_BASED_OUTPATIENT_CLINIC_OR_DEPARTMENT_OTHER): Payer: Self-pay

## 2022-09-08 ENCOUNTER — Ambulatory Visit (HOSPITAL_BASED_OUTPATIENT_CLINIC_OR_DEPARTMENT_OTHER): Payer: Self-pay | Admitting: Nurse Practitioner

## 2022-09-08 ENCOUNTER — Encounter (HOSPITAL_BASED_OUTPATIENT_CLINIC_OR_DEPARTMENT_OTHER): Payer: Self-pay

## 2022-09-08 ENCOUNTER — Ambulatory Visit: Payer: 59 | Admitting: Podiatry

## 2022-09-15 ENCOUNTER — Encounter: Payer: 59 | Admitting: Nurse Practitioner

## 2022-09-17 ENCOUNTER — Telehealth: Payer: 59 | Admitting: Family Medicine

## 2022-09-17 ENCOUNTER — Other Ambulatory Visit (HOSPITAL_BASED_OUTPATIENT_CLINIC_OR_DEPARTMENT_OTHER): Payer: Self-pay

## 2022-09-17 ENCOUNTER — Encounter: Payer: Self-pay | Admitting: Podiatry

## 2022-09-17 ENCOUNTER — Ambulatory Visit: Payer: 59 | Admitting: Podiatry

## 2022-09-17 DIAGNOSIS — M545 Low back pain, unspecified: Secondary | ICD-10-CM

## 2022-09-17 DIAGNOSIS — M722 Plantar fascial fibromatosis: Secondary | ICD-10-CM

## 2022-09-17 MED ORDER — NAPROXEN 500 MG PO TABS
500.0000 mg | ORAL_TABLET | Freq: Two times a day (BID) | ORAL | 0 refills | Status: DC
  Filled 2022-09-17: qty 30, 15d supply, fill #0

## 2022-09-17 MED ORDER — CYCLOBENZAPRINE HCL 10 MG PO TABS
10.0000 mg | ORAL_TABLET | Freq: Three times a day (TID) | ORAL | 0 refills | Status: DC | PRN
  Filled 2022-09-17: qty 30, 10d supply, fill #0

## 2022-09-17 MED ORDER — TIZANIDINE HCL 2 MG PO TABS
2.0000 mg | ORAL_TABLET | Freq: Three times a day (TID) | ORAL | 0 refills | Status: DC
  Filled 2022-09-17: qty 30, 10d supply, fill #0

## 2022-09-17 MED ORDER — TRIAMCINOLONE ACETONIDE 10 MG/ML IJ SUSP
10.0000 mg | Freq: Once | INTRAMUSCULAR | Status: AC
  Administered 2022-09-17: 10 mg

## 2022-09-17 NOTE — Progress Notes (Addendum)

## 2022-09-17 NOTE — Addendum Note (Signed)
Addended by: Perlie Mayo on: 09/17/2022 01:21 PM   Modules accepted: Orders

## 2022-09-17 NOTE — Addendum Note (Signed)
Addended by: Perlie Mayo on: 09/17/2022 01:19 PM   Modules accepted: Orders

## 2022-09-18 ENCOUNTER — Ambulatory Visit: Payer: Self-pay | Admitting: Nurse Practitioner

## 2022-09-18 NOTE — Progress Notes (Signed)
Subjective:   Patient ID: [name], female   DOB: 46 y.o.   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  States that the pain is moderated but the right one is quite sore now and still some discomfort left with flatfoot deformity   ROS      Objective:  Physical Exam  Neurovascular status intact moderate flatfoot deformity noted discomfort in the right plantar fascia at insertion of the tendon into the calcaneus with fluid buildup and mild discomfort on the left with flatfoot deformity bilateral     Assessment:  Chronic Planter fasciitis with acute nature to it right over left now with the left having been treated which did provide some improvement and flatfoot deformity     Plan:  H&P reviewed condition and went ahead today for the right I did sterile prep injected the fascia at insertion 3 mg Kenalog 5 mg Xylocaine applied sterile dressing and did discuss orthotics and patient was casted for orthotics today.  Custom molds done will get these back to her soon as possible

## 2022-09-25 ENCOUNTER — Other Ambulatory Visit (HOSPITAL_BASED_OUTPATIENT_CLINIC_OR_DEPARTMENT_OTHER): Payer: Self-pay

## 2022-10-04 ENCOUNTER — Other Ambulatory Visit (HOSPITAL_BASED_OUTPATIENT_CLINIC_OR_DEPARTMENT_OTHER): Payer: Self-pay

## 2022-10-18 ENCOUNTER — Other Ambulatory Visit (HOSPITAL_BASED_OUTPATIENT_CLINIC_OR_DEPARTMENT_OTHER): Payer: Self-pay

## 2022-10-20 ENCOUNTER — Other Ambulatory Visit (HOSPITAL_BASED_OUTPATIENT_CLINIC_OR_DEPARTMENT_OTHER): Payer: Self-pay

## 2022-10-22 ENCOUNTER — Ambulatory Visit (INDEPENDENT_AMBULATORY_CARE_PROVIDER_SITE_OTHER): Payer: 59

## 2022-10-22 DIAGNOSIS — M722 Plantar fascial fibromatosis: Secondary | ICD-10-CM

## 2022-10-22 NOTE — Progress Notes (Signed)
Patient presents today to pick up custom molded foot orthotics, diagnosed with plantar fasciitis by Dr. Charlsie Merles.   Orthotics were dispensed and fit was satisfactory. Reviewed instructions for break-in and wear. Written instructions given to patient.  Patient will follow up as needed.   [name] - order # [id number]

## 2022-10-23 ENCOUNTER — Other Ambulatory Visit (HOSPITAL_BASED_OUTPATIENT_CLINIC_OR_DEPARTMENT_OTHER): Payer: Self-pay

## 2022-10-24 ENCOUNTER — Other Ambulatory Visit (HOSPITAL_BASED_OUTPATIENT_CLINIC_OR_DEPARTMENT_OTHER): Payer: Self-pay

## 2022-10-27 ENCOUNTER — Other Ambulatory Visit (HOSPITAL_BASED_OUTPATIENT_CLINIC_OR_DEPARTMENT_OTHER): Payer: Self-pay

## 2022-11-06 ENCOUNTER — Other Ambulatory Visit (HOSPITAL_BASED_OUTPATIENT_CLINIC_OR_DEPARTMENT_OTHER): Payer: Self-pay

## 2022-11-06 ENCOUNTER — Other Ambulatory Visit: Payer: 59

## 2022-11-06 MED ORDER — PRAMIPEXOLE DIHYDROCHLORIDE 1.5 MG PO TABS
1.5000 mg | ORAL_TABLET | Freq: Every day | ORAL | 3 refills | Status: DC
  Filled 2022-11-06 – 2022-11-26 (×3): qty 30, 30d supply, fill #0
  Filled 2023-02-15: qty 30, 30d supply, fill #1
  Filled 2023-06-06: qty 30, 30d supply, fill #2
  Filled 2023-07-06: qty 30, 30d supply, fill #3

## 2022-11-06 MED ORDER — MODAFINIL 200 MG PO TABS
ORAL_TABLET | ORAL | 2 refills | Status: DC
  Filled 2022-11-06: qty 30, 30d supply, fill #0
  Filled 2022-12-02: qty 30, 30d supply, fill #1
  Filled 2022-12-31: qty 30, 30d supply, fill #2

## 2022-11-06 MED ORDER — TRAZODONE HCL 100 MG PO TABS
100.0000 mg | ORAL_TABLET | Freq: Every evening | ORAL | 2 refills | Status: DC | PRN
  Filled 2022-11-18 – 2022-11-25 (×4): qty 60, 30d supply, fill #0
  Filled 2022-12-21: qty 60, 30d supply, fill #1
  Filled 2023-02-15: qty 60, 30d supply, fill #2
  Filled ????-??-??: fill #0

## 2022-11-06 MED ORDER — LORAZEPAM 1 MG PO TABS
1.0000 mg | ORAL_TABLET | Freq: Two times a day (BID) | ORAL | 3 refills | Status: DC | PRN
  Filled 2022-11-18 – 2022-11-21 (×3): qty 60, 30d supply, fill #0
  Filled 2022-12-25: qty 60, 30d supply, fill #1
  Filled 2023-01-22: qty 60, 30d supply, fill #2
  Filled 2023-02-21 – 2023-02-23 (×2): qty 60, 30d supply, fill #3

## 2022-11-06 MED ORDER — BUSPIRONE HCL 30 MG PO TABS
30.0000 mg | ORAL_TABLET | Freq: Two times a day (BID) | ORAL | 3 refills | Status: DC
  Filled 2022-11-06: qty 60, 30d supply, fill #0
  Filled 2022-12-02: qty 60, 30d supply, fill #1
  Filled 2023-03-20: qty 60, 30d supply, fill #2
  Filled 2023-09-01 – 2023-09-04 (×2): qty 60, 30d supply, fill #3

## 2022-11-06 MED ORDER — DULOXETINE HCL 30 MG PO CPEP
90.0000 mg | ORAL_CAPSULE | Freq: Every day | ORAL | 3 refills | Status: DC
  Filled 2022-11-06 – 2022-11-18 (×2): qty 90, 30d supply, fill #0
  Filled 2022-12-21: qty 90, 30d supply, fill #1
  Filled 2023-03-13 – 2023-03-14 (×2): qty 90, 30d supply, fill #2
  Filled 2023-04-10 – 2023-04-11 (×2): qty 90, 30d supply, fill #3

## 2022-11-07 ENCOUNTER — Other Ambulatory Visit (HOSPITAL_BASED_OUTPATIENT_CLINIC_OR_DEPARTMENT_OTHER): Payer: Self-pay

## 2022-11-10 ENCOUNTER — Ambulatory Visit: Payer: 59 | Admitting: Family

## 2022-11-10 ENCOUNTER — Encounter: Payer: Self-pay | Admitting: Family

## 2022-11-10 ENCOUNTER — Other Ambulatory Visit (HOSPITAL_BASED_OUTPATIENT_CLINIC_OR_DEPARTMENT_OTHER): Payer: Self-pay

## 2022-11-10 VITALS — BP 111/76 | HR 79 | Temp 98.4°F | Resp 16 | Ht 67.0 in | Wt 217.0 lb

## 2022-11-10 DIAGNOSIS — E538 Deficiency of other specified B group vitamins: Secondary | ICD-10-CM | POA: Diagnosis not present

## 2022-11-10 DIAGNOSIS — D649 Anemia, unspecified: Secondary | ICD-10-CM

## 2022-11-10 DIAGNOSIS — F419 Anxiety disorder, unspecified: Secondary | ICD-10-CM

## 2022-11-10 DIAGNOSIS — Z79899 Other long term (current) drug therapy: Secondary | ICD-10-CM

## 2022-11-10 DIAGNOSIS — E559 Vitamin D deficiency, unspecified: Secondary | ICD-10-CM | POA: Diagnosis not present

## 2022-11-10 DIAGNOSIS — G4733 Obstructive sleep apnea (adult) (pediatric): Secondary | ICD-10-CM | POA: Insufficient documentation

## 2022-11-10 DIAGNOSIS — R519 Headache, unspecified: Secondary | ICD-10-CM

## 2022-11-10 DIAGNOSIS — I10 Essential (primary) hypertension: Secondary | ICD-10-CM

## 2022-11-10 DIAGNOSIS — F902 Attention-deficit hyperactivity disorder, combined type: Secondary | ICD-10-CM

## 2022-11-10 DIAGNOSIS — F32A Depression, unspecified: Secondary | ICD-10-CM

## 2022-11-10 LAB — CBC WITH DIFFERENTIAL/PLATELET
Basophils Absolute: 0 10*3/uL (ref 0.0–0.1)
Basophils Relative: 1 % (ref 0.0–3.0)
Eosinophils Absolute: 0 10*3/uL (ref 0.0–0.7)
Eosinophils Relative: 1.1 % (ref 0.0–5.0)
HCT: 39.7 % (ref 36.0–46.0)
Hemoglobin: 13.6 g/dL (ref 12.0–15.0)
Lymphocytes Relative: 28.1 % (ref 12.0–46.0)
Lymphs Abs: 1.2 10*3/uL (ref 0.7–4.0)
MCHC: 34.2 g/dL (ref 30.0–36.0)
MCV: 93.5 fl (ref 78.0–100.0)
Monocytes Absolute: 0.4 10*3/uL (ref 0.1–1.0)
Monocytes Relative: 9.7 % (ref 3.0–12.0)
Neutro Abs: 2.6 10*3/uL (ref 1.4–7.7)
Neutrophils Relative %: 60.1 % (ref 43.0–77.0)
Platelets: 294 10*3/uL (ref 150.0–400.0)
RBC: 4.25 Mil/uL (ref 3.87–5.11)
RDW: 12.6 % (ref 11.5–15.5)
WBC: 4.4 10*3/uL (ref 4.0–10.5)

## 2022-11-10 LAB — VITAMIN B12: Vitamin B-12: 520 pg/mL (ref 211–911)

## 2022-11-10 LAB — VITAMIN D 25 HYDROXY (VIT D DEFICIENCY, FRACTURES): VITD: 40.25 ng/mL (ref 30.00–100.00)

## 2022-11-10 LAB — FOLATE: Folate: >23.8 ng/mL (ref >5.9)

## 2022-11-10 MED ORDER — BUTALBITAL-APAP-CAFFEINE 50-325-40 MG PO TABS
1.0000 | ORAL_TABLET | Freq: Four times a day (QID) | ORAL | 0 refills | Status: DC | PRN
  Filled 2022-11-10: qty 30, 8d supply, fill #0

## 2022-11-10 NOTE — Assessment & Plan Note (Addendum)
Will refer for sleep study. I wonder if her headaches are being exacerbated by uncontrolled OSA?

## 2022-11-10 NOTE — Assessment & Plan Note (Signed)
Currently stable on provigil.  This is being managed by psychiatry.

## 2022-11-10 NOTE — Progress Notes (Signed)
Subjective:   By signing my name below, I, [name], attest that this documentation has been prepared under the direction and in the presence of [name], 11/10/2022.   Patient ID: [name], female    DOB: 23-Dec-1975, 46 y.o.   MRN: [id number]  Chief Complaint  Patient presents with   New Patient (Initial Visit)    Here to establish care    HPI Patient is in today for an establishment of care.  GI Problems Patient reports that in 2015 she had a gastric bypass surgery completed and the following year, had her gallbladder removed. In 2010, she had a perforated ulcer and then a second in 2012. Se was told she needed a gastric bypass revision surgery as there was complications to the first one that was causing other problems. The revision was completed in January along with a hernia repair. She also explains that she experienced stool incontinence and had a colonoscopy performed showing no findings. She then had a CT scan identifying the problem and had it resolved.   GERD Patient is complaint with 40 mg Nexium and 40 mg pepcid.   Adhd  Patient reports that she was diagnosed with ADHD as an adult 15-17 years ago. She started on vyvanse and then switched to adderall which helped for a long time. She reports that recently she has been experiencing insomnia which adderalll has not been helping. Her psychiatrist, Dr. Earlene Plater, controls her medications.   Osa suspicion Patient reports that she had a positive sleep test a while back and was given a CPAP. She stopped using the CPAP and eventually had another test done that came back negative. Her dentist suspects osa as a possibility. She is given modafinil which does help.  Anemia Patient reports that she is complaint with fusion plus iron supplements. She experienced anemia before and after pregnancy 11 years ago. She explains that she had trouble with iron levels several years after pregnancy.   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 She was previously on zoloft, but depression is well controlled now with 30 mg buspar, 1 mg ativan, and 30 mg cymbalta. She reports that her depression can get t the point where she does not want to shower, get out of bed, and has low motivation. She had not experienced that in a few years. She has thought of suicide in the past, but did not make a plan. She expresses that her kids help her get out of this state of mind. Patient reports that she has a good support system.  Vitamin D deficiency She is complaint with vitamin d supplements   Vitamin B deficiency She is complaint with b 12 vitamin B supplements.  Headaches Patient reports that she experiences stress headaches.  She reports that at some point it was bad enough she saw a headache specialist. Patient is not allowed to take ibuprofen and was prescribed Fioricet 40 mg by her psychiatrist, Dr. Earlene Plater. She was previously on maxalt, but is currently not. She does not feel as though she needs maxalt, but is requesting a refill. Health Maintenance Due  Topic Date Due   Hepatitis C Screening  Never done   PAP SMEAR-Modifier  Never done   COLONOSCOPY (Pts 45-70yrs Insurance coverage will need to be confirmed)  Never done   DTaP/Tdap/Td (2 - Td or Tdap) 10/15/2021   COVID-19 Vaccine (5 - 2023-24 season) 07/25/2022    Past  Medical History:  Diagnosis Date   ADHD (attention deficit hyperactivity disorder)    Anemia    Depression    GERD (gastroesophageal reflux disease)    Headache(784.0)    Hypertension    IBS (irritable bowel syndrome)    Incisional hernia    SVD (spontaneous vaginal delivery) 10/17/2011   Ulcer     Past Surgical History:  Procedure Laterality Date   CHOLECYSTECTOMY  11/24/2004   ESOPHAGOGASTRODUODENOSCOPY  11/28/2011   Procedure: ESOPHAGOGASTRODUODENOSCOPY (EGD);  Surgeon: Theda BelfastPatrick D Hung, MD;  [facility];  Service: Endoscopy;  Laterality: N/A;   lap repair gastrojejunal anastomotic perforation  02/21/2011   Dr Andrey CampanileWilson   LAPAROSCOPIC GASTROTOMY W/ REPAIR OF ULCER  10/23/2009   ROUX-EN-Y GASTRIC BYPASS  11/25/2003   SMALL BOWEL REPAIR  06/05/2022   VENTRAL HERNIA REPAIR  12/22/2011   Procedure: HERNIA REPAIR VENTRAL ADULT;  Surgeon: Valarie MerinoMatthew B Martin, MD;  Location: [facility];  Service: General;;    Family History  Problem Relation Age of Onset   CVA Mother    Heart disease Mother        fibroelastoma on her heart, CAD, CABG   Cerebral aneurysm Mother    COPD Father    Liver cancer Father    Alcohol abuse Father    Drug abuse Brother    Alcohol abuse Brother    Drug abuse Brother    Alcohol abuse Brother    Heart disease Maternal Grandmother    Aneurysm Maternal Grandfather    Diabetes Paternal Grandmother    Drug abuse Son        in recovery   Asthma Son    Depression Son    ADD / ADHD Son    Anxiety disorder Son    Tourette syndrome Son    Anesthesia problems Neg Hx    Hypotension Neg Hx    Malignant hyperthermia Neg Hx    Pseudochol deficiency Neg Hx    Stroke Neg Hx     Social History   Socioeconomic History   Marital status: Married    Spouse name: Not on file   Number of children: Not on file   Years of education: Not on file   Highest education level: Not on file  Occupational History   Not on file  Tobacco Use   Smoking status: Never   Smokeless tobacco: Never  Vaping Use   Vaping Use: Never used  Substance and Sexual Activity   Alcohol use: No   Drug use: No   Sexual activity: Yes    Birth control/protection: Surgical    Comment: esure  Other Topics Concern   Not on file  Social History Narrative   Married   2 sons (one is grown)   One son at home   Step daughter at home    2 step sons out of the house   RN at [employer]   Enjoys crafts, coloring, reading, e-bikes, spending time with her children.    Social  Determinants of Health   Financial Resource Strain: Not on file  Food Insecurity: Not on file  Transportation Needs: Not on file  Physical Activity: Insufficiently Active (11/10/2022)   Exercise Vital Sign    Days of Exercise per Week: 2 days    Minutes of Exercise per Session: 60 min  Stress: Not on file  Social Connections: Not on file  Intimate Partner Violence: Not on file    Outpatient Medications Prior to Visit  Medication Sig Dispense Refill   acetaminophen (TYLENOL) 500 MG tablet Take 1,000 mg by mouth every 6 (six) hours as needed. For pain     busPIRone (BUSPAR) 30 MG tablet Take 1 tablet (30 mg total) by mouth 2 (two) times daily with a meal. 60 tablet 3   Calcium Carb-Cholecalciferol (CHEWABLE CALCIUM/D3 PO)      cyanocobalamin 500 MCG tablet Take 500 mcg by mouth daily.     DULoxetine (CYMBALTA) 30 MG capsule Take 3 capsules (90 mg total) by mouth daily. 90 capsule 3   DULoxetine HCl 30 MG CSDR      esomeprazole (NEXIUM) 40 MG packet NexIUM     famotidine (PEPCID) 40 MG tablet Take 40 mg by mouth daily.     Iron-FA-B Cmp-C-Biot-Probiotic (FUSION PLUS) CAPS      LORazepam (ATIVAN) 1 MG tablet Take 1 tablet (1 mg total) by mouth 2 (two) times daily for severe anxiety. 60 tablet 3   modafinil (PROVIGIL) 200 MG tablet Take 0.5 tablet (100 mg) by mouth every morning for 7 days, THEN increase to 1 tablet (200 mg) every morning. 30 tablet 2   pramipexole (MIRAPEX) 1.5 MG tablet Take 1 tablet (1.5 mg total) by mouth at bedtime. 30 tablet 3   tiZANidine (ZANAFLEX) 2 MG tablet Take 1 tablet (2 mg total) by mouth 3 (three) times daily. 30 tablet 0   traZODone (DESYREL) 100 MG tablet Take 1-2 tablets (100-200 mg total) by mouth at bedtime as needed for insomnia. 60 tablet 2   traZODone (DESYREL) 100 MG tablet Take 1-2 tablets at bedtime prn insomnia 60 tablet 2   amphetamine-dextroamphetamine (ADDERALL) 20 MG tablet Take 20 mg by mouth 2 (two) times daily.      amphetamine-dextroamphetamine (ADDERALL) 20 MG tablet Take 1 tablet by mouth twice daily 60 tablet 0   busPIRone (BUSPAR) 30 MG tablet Take 1 tablet (30 mg total) by mouth 2 (two) times daily with food. 60 tablet 3   DULoxetine (CYMBALTA) 30 MG capsule Take 3 capsules (90 mg total) by mouth daily. 90 capsule 3   LORazepam (ATIVAN) 1 MG tablet      LORazepam (ATIVAN) 1 MG tablet Take 1 tablet (1 mg total) by mouth every 12 (twelve) hours as needed for severe anxiety. 60 tablet 3   phenazopyridine (PYRIDIUM) 200 MG tablet Take by mouth.     pramipexole (MIRAPEX) 1.5 MG tablet      pramipexole (MIRAPEX) 1.5 MG tablet Take 1 tablet (1.5 mg total) by mouth at bedtime. 30 tablet 3   No facility-administered medications prior to visit.    No Known Allergies  ROS See HPI    Objective:    Physical Exam Constitutional:      General: She is not in acute distress.    Appearance: Normal appearance. She is not ill-appearing.  HENT:     Head: Normocephalic and atraumatic.     Right Ear: External ear normal.     Left Ear: External ear normal.  Eyes:     Extraocular Movements: Extraocular movements intact.     Pupils: Pupils are equal, round, and reactive to light.  Cardiovascular:     Rate and Rhythm: Normal rate and regular rhythm.     Heart sounds: Normal heart sounds. No murmur heard.    No gallop.  Pulmonary:     Effort: Pulmonary effort is normal. No respiratory distress.     Breath sounds: Normal breath sounds. No wheezing or rales.  Skin:  General: Skin is warm and dry.  Neurological:     Mental Status: She is alert and oriented to person, place, and time.  Psychiatric:        Mood and Affect: Mood normal.        Behavior: Behavior normal.        Judgment: Judgment normal.     BP 111/76 (BP Location: Right Arm, Patient Position: Sitting, Cuff Size: Small)   Pulse 79   Temp 98.4 F (36.9 C) (Oral)   Resp 16   Ht 5\' 7"  (1.702 m)   Wt 217 lb (98.4 kg)   SpO2 98%   BMI  33.99 kg/m  Wt Readings from Last 3 Encounters:  11/10/22 217 lb (98.4 kg)  11/28/11 180 lb (81.6 kg)  10/15/11 217 lb (98.4 kg)       Assessment & Plan:   Problem List Items Addressed This Visit       Unprioritized   OSA (obstructive sleep apnea) - Primary    Will refer for sleep study. I wonder if her headaches are being exacerbated by uncontrolled OSA?      Relevant Orders   Ambulatory referral to Pulmonology   Headache    Notes some "stress headaches."  Rare migraines.  Has used fioricet in the past for migraines which has been helpful. Requests refill today. Controlled substance contract is signed today and we will obtain a UDS. Reviewed controlled substance registry.       Relevant Medications   butalbital-acetaminophen-caffeine (FIORICET) 50-325-40 MG tablet   Essential hypertension, benign    BP Readings from Last 3 Encounters:  11/10/22 111/76  11/28/11 (!) 148/138  10/25/11 143/94  Not currently on medication for bp.  Monitor.       Anxiety and depression    Diagnosed at age 5.  Reports well controlled with cymbalta, buspar and mirapex. This is being managed by her psychiatrist- 15 NP. She is also treated with ativan prn.       Anemia    She takes fusion plus iron capsules.        Relevant Orders   CBC w/Diff   Iron, TIBC and Ferritin Panel   Folate   ADHD (attention deficit hyperactivity disorder), combined type    Currently stable on provigil.  This is being managed by psychiatry.       Other Visit Diagnoses     B12 deficiency       Relevant Orders   B12   Vitamin D deficiency       Relevant Orders   Vitamin D (25 hydroxy)   High risk medication use       Relevant Orders   DRUG MONITORING, PANEL 8 WITH CONFIRMATION, URINE      Meds ordered this encounter  Medications   butalbital-acetaminophen-caffeine (FIORICET) 50-325-40 MG tablet    Sig: Take 1 tablet by mouth every 6 (six) hours as needed for headache.    Dispense:  30  tablet    Refill:  0    I, Fransisca Kaufmann, personally preformed the services described in this documentation.  All medical record entries made by the scribe were at my direction and in my presence.  I have reviewed the chart and discharge instructions (if applicable) and agree that the record reflects my personal performance and is accurate and complete. 11/10/2022.   I,Verona Buck,acting as a 11/12/2022 for Neurosurgeon, NP.,have documented all relevant documentation on the behalf of [employer], NP,as directed by  Nance Pear, NP while in the presence of Nance Pear, NP.    Nance Pear, NP

## 2022-11-10 NOTE — Assessment & Plan Note (Addendum)
Diagnosed at age 46.  Reports well controlled with cymbalta, buspar and mirapex. This is being managed by her psychiatrist- Fransisca Kaufmann NP. She is also treated with ativan prn.

## 2022-11-10 NOTE — Assessment & Plan Note (Signed)
She takes fusion plus iron capsules.

## 2022-11-10 NOTE — Assessment & Plan Note (Signed)
BP Readings from Last 3 Encounters:  11/10/22 111/76  11/28/11 (!) 148/138  10/25/11 143/94   Not currently on medication for bp.  Monitor.

## 2022-11-10 NOTE — Assessment & Plan Note (Addendum)
Notes some "stress headaches."  Rare migraines.  Has used fioricet in the past for migraines which has been helpful. Requests refill today. Controlled substance contract is signed today and we will obtain a UDS. Reviewed controlled substance registry.

## 2022-11-11 LAB — IRON,TIBC AND FERRITIN PANEL
%SAT: 32 % (calc) (ref 16–45)
Ferritin: 9 ng/mL — ABNORMAL LOW (ref 16–232)
Iron: 103 ug/dL (ref 40–190)
TIBC: 323 mcg/dL (calc) (ref 250–450)

## 2022-11-13 LAB — DRUG MONITORING, PANEL 8 WITH CONFIRMATION, URINE
6 Acetylmorphine: NEGATIVE ng/mL (ref <10)
Alcohol Metabolites: NEGATIVE ng/mL (ref <500)
Alphahydroxyalprazolam: NEGATIVE ng/mL (ref <25)
Alphahydroxymidazolam: NEGATIVE ng/mL (ref <50)
Alphahydroxytriazolam: NEGATIVE ng/mL (ref <50)
Aminoclonazepam: NEGATIVE ng/mL (ref <25)
Amphetamine: NEGATIVE ng/mL (ref <250)
Amphetamines: NEGATIVE ng/mL (ref <500)
Benzodiazepines: POSITIVE ng/mL — AB (ref <100)
Buprenorphine, Urine: NEGATIVE ng/mL (ref <5)
Cocaine Metabolite: NEGATIVE ng/mL (ref <150)
Creatinine: 94.6 mg/dL (ref >=20.0)
Hydroxyethylflurazepam: NEGATIVE ng/mL (ref <50)
Lorazepam: 1163 ng/mL — ABNORMAL HIGH (ref <50)
MDMA: NEGATIVE ng/mL (ref <500)
Marijuana Metabolite: NEGATIVE ng/mL (ref <20)
Methamphetamine: NEGATIVE ng/mL (ref <250)
Nordiazepam: NEGATIVE ng/mL (ref <50)
Opiates: NEGATIVE ng/mL (ref <100)
Oxazepam: NEGATIVE ng/mL (ref <50)
Oxidant: NEGATIVE ug/mL (ref <200)
Oxycodone: NEGATIVE ng/mL (ref <100)
Temazepam: NEGATIVE ng/mL (ref <50)
pH: 6.3 (ref 4.5–9.0)

## 2022-11-13 LAB — DM TEMPLATE

## 2022-11-18 ENCOUNTER — Other Ambulatory Visit (HOSPITAL_BASED_OUTPATIENT_CLINIC_OR_DEPARTMENT_OTHER): Payer: Self-pay

## 2022-11-21 ENCOUNTER — Other Ambulatory Visit: Payer: Self-pay

## 2022-11-21 ENCOUNTER — Other Ambulatory Visit (HOSPITAL_BASED_OUTPATIENT_CLINIC_OR_DEPARTMENT_OTHER): Payer: Self-pay

## 2022-11-22 ENCOUNTER — Other Ambulatory Visit (HOSPITAL_BASED_OUTPATIENT_CLINIC_OR_DEPARTMENT_OTHER): Payer: Self-pay

## 2022-11-24 ENCOUNTER — Other Ambulatory Visit (HOSPITAL_BASED_OUTPATIENT_CLINIC_OR_DEPARTMENT_OTHER): Payer: Self-pay

## 2022-11-25 ENCOUNTER — Other Ambulatory Visit (HOSPITAL_BASED_OUTPATIENT_CLINIC_OR_DEPARTMENT_OTHER): Payer: Self-pay

## 2022-11-26 ENCOUNTER — Other Ambulatory Visit (HOSPITAL_BASED_OUTPATIENT_CLINIC_OR_DEPARTMENT_OTHER): Payer: Self-pay

## 2022-12-01 ENCOUNTER — Ambulatory Visit (INDEPENDENT_AMBULATORY_CARE_PROVIDER_SITE_OTHER): Payer: Commercial Managed Care - PPO | Admitting: Pulmonary Disease

## 2022-12-01 ENCOUNTER — Encounter (HOSPITAL_BASED_OUTPATIENT_CLINIC_OR_DEPARTMENT_OTHER): Payer: Self-pay | Admitting: Pulmonary Disease

## 2022-12-01 VITALS — BP 126/68 | HR 85 | Temp 98.4°F | Ht 67.0 in | Wt 224.4 lb

## 2022-12-01 DIAGNOSIS — G4733 Obstructive sleep apnea (adult) (pediatric): Secondary | ICD-10-CM | POA: Diagnosis not present

## 2022-12-01 DIAGNOSIS — R0602 Shortness of breath: Secondary | ICD-10-CM

## 2022-12-01 NOTE — Patient Instructions (Addendum)
  X Amb sat X check d -dimer  X Home sleep test. Please obtain copies of previous sleep studies from [facility]

## 2022-12-01 NOTE — Progress Notes (Signed)
Subjective:    Patient ID: [name], female    DOB: Oct 05, 1976, 47 y.o.   MRN: [id number]  HPI   Chief Complaint  Patient presents with   Consult    Pt states she stays very tired, waking in the middle of night with jaw pain and headaches. Pt has and x3 sleep studies. 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 She underwent gastric bypass in 2005 and then revision in 2013 due to ulcers.  She was at her heaviest weight about 320 pounds when she underwent her first sleep study in 2005, this was surprisingly negative.  After her bypass surgery she lost weight to the lowest weight of 140 pounds and underwent a second study in 2015 which was positive.  She was placed on CPAP with a fullface mask which she used successfully for a few years but then as her symptoms improved she stopped using this. She underwent another home sleep test in 2021 had a heavier weight and this was negative. She reports excessive daytime drowsiness.  Epworth sleepiness score is 16 and she reports sleepiness while sitting and reading, watching TV or as a passenger in the car. However at nighttime she has trouble falling asleep and she has to use trazodone.  Bedtime is around 10 PM, sleep latency about 30 minutes, she sleeps on her side with 1 pillow, reports 2-3 nocturnal awakenings.  Many times when she wakes up she is unable to fall back asleep.  She wakes up in the morning with jaw pain and headaches and tiredness around 6:30 AM She has gained significant weight to her current weight of 224 pounds There is no history suggestive of cataplexy, sleep paralysis or parasomnias  PMH -anxiety and ADHD for which she is on BuSpar, Cymbalta and pramipexole.  Adderall was changed to Provigil -GERD with chronic cough, Nexium and Pepcid, previous EGD has been negative.  She also reports shortness of breath for 2 weeks denies sputum  production or wheezing.  This occurs at rest as well as on exertion.  She had a chest x-ray a year ago and was told this was negative  She did not desaturate on ambulation  Significant tests/ events reviewed  04/2020 HST >>    Past Medical History:  Diagnosis Date   ADHD (attention deficit hyperactivity disorder)    Anemia    Depression    GERD (gastroesophageal reflux disease)    Headache(784.0)    Hypertension    IBS (irritable bowel syndrome)    Incisional hernia    SVD (spontaneous vaginal delivery) 10/17/2011   Ulcer    Past Surgical History:  Procedure Laterality Date   CHOLECYSTECTOMY  11/24/2004   ESOPHAGOGASTRODUODENOSCOPY  11/28/2011   Procedure: ESOPHAGOGASTRODUODENOSCOPY (EGD);  Surgeon: Theda Belfast, MD;  Location[facility];  Service: Endoscopy;  Laterality: N/A;   lap repair gastrojejunal anastomotic perforation  02/21/2011   Dr Andrey Campanile   LAPAROSCOPIC GASTROTOMY W/ REPAIR OF ULCER  10/23/2009   ROUX-EN-Y GASTRIC BYPASS  11/25/2003   SMALL BOWEL REPAIR  06/05/2022   VENTRAL HERNIA REPAIR  12/22/2011   Procedure: HERNIA REPAIR VENTRAL ADULT;  Surgeon: Valarie Merino, MD;  Location: [facility];  Service: General;;    No Known Allergies  Social History   Socioeconomic History   Marital status: Married    Spouse name: Not on file   Number of children: Not on file  Years of education: Not on file   Highest education level: Not on file  Occupational History   Not on file  Tobacco Use   Smoking status: Never    Passive exposure: Past   Smokeless tobacco: Never  Vaping Use   Vaping Use: Never used  Substance and Sexual Activity   Alcohol use: No   Drug use: No   Sexual activity: Yes    Birth control/protection: Surgical    Comment: esure  Other Topics Concern   Not on file  Social History Narrative   Married   2 sons (one is grown)   One son at home   Step daughter at home    2 step sons out of the house   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    Social Determinants of Health   Financial Resource Strain: Not on file  Food Insecurity: Not on file  Transportation Needs: Not on file  Physical Activity: Insufficiently Active (11/10/2022)   Exercise Vital Sign    Days of Exercise per Week: 2 days    Minutes of Exercise per Session: 60 min  Stress: Not on file  Social Connections: Not on file  Intimate Partner Violence: Not on file    Family History  Problem Relation Age of Onset   CVA Mother    Heart disease Mother        fibroelastoma on her heart, CAD, CABG   Cerebral aneurysm Mother    COPD Father    Liver cancer Father    Alcohol abuse Father    Drug abuse Brother    Alcohol abuse Brother    Drug abuse Brother    Alcohol abuse Brother    Heart disease Maternal Grandmother    Aneurysm Maternal Grandfather    Diabetes Paternal Grandmother    Drug abuse Son        in recovery   Asthma Son    Depression Son    ADD / ADHD Son    Anxiety disorder Son    Tourette syndrome Son    Anesthesia problems Neg Hx    Hypotension Neg Hx    Malignant hyperthermia Neg Hx    Pseudochol deficiency Neg Hx    Stroke Neg Hx       Review of Systems Shortness of breath with activity and at rest Acid heartburn Weight gain Headaches Sneezing Anxiety and depression Joint stiffness    Objective:   Physical Exam  Gen. Pleasant, well-nourished, in no distress, normal affect ENT - no pallor,icterus, no post nasal drip Neck: No JVD, no thyromegaly, no carotid bruits Lungs: no use of accessory muscles, no dullness to percussion, clear without rales or rhonchi  Cardiovascular: Rhythm regular, heart sounds  normal, no murmurs or gallops, no peripheral edema Abdomen: soft and non-tender, no hepatosplenomegaly, BS normal. Musculoskeletal: No deformities, no cyanosis or clubbing Neuro:  alert, non focal       Assessment & Plan:

## 2022-12-01 NOTE — Assessment & Plan Note (Signed)
Shortness of breath -unclear cause, chest x-ray 09/2021 was clear CT abdomen/pelvis 04/2022 does not show any evidence of ILD There is no overt cardio pulmonary etiology apparent.  She does not have any risk factors for VTE.  We will obtain D-dimer but this appears to be low clinical probability for VTE We will hold off on bronchodilators or further testing at this time

## 2022-12-01 NOTE — Assessment & Plan Note (Signed)
She does have some symptoms of sleep disruption, nonrefreshing sleep and excessive daytime sleepiness with snoring noted by her husband. It is possible that OSA is required especially due to her weight gain. Will proceed with home sleep testing  The pathophysiology of obstructive sleep apnea , it's cardiovascular consequences & modes of treatment including CPAP were discused with the patient in detail & they evidenced understanding.

## 2022-12-02 ENCOUNTER — Other Ambulatory Visit: Payer: Self-pay

## 2022-12-02 LAB — D-DIMER, QUANTITATIVE: D-DIMER: 0.38 mg/L FEU (ref 0.00–0.49)

## 2022-12-04 ENCOUNTER — Other Ambulatory Visit (HOSPITAL_BASED_OUTPATIENT_CLINIC_OR_DEPARTMENT_OTHER): Payer: Self-pay | Admitting: Family

## 2022-12-04 DIAGNOSIS — Z1231 Encounter for screening mammogram for malignant neoplasm of breast: Secondary | ICD-10-CM

## 2022-12-05 ENCOUNTER — Telehealth (HOSPITAL_BASED_OUTPATIENT_CLINIC_OR_DEPARTMENT_OTHER): Payer: Self-pay

## 2022-12-05 NOTE — Telephone Encounter (Signed)
-----  Message from Rigoberto Noel, MD sent at 12/02/2022  9:51 AM EST ----- D-dimer was negative which is reassuring

## 2022-12-05 NOTE — Telephone Encounter (Signed)
Called Pt with results of her D- Dimmer. Pt stated understanding and nothing further needed.

## 2022-12-08 ENCOUNTER — Inpatient Hospital Stay (HOSPITAL_BASED_OUTPATIENT_CLINIC_OR_DEPARTMENT_OTHER)
Admission: RE | Admit: 2022-12-08 | Payer: Commercial Managed Care - PPO | Source: Ambulatory Visit | Admitting: Radiology

## 2022-12-08 DIAGNOSIS — Z1231 Encounter for screening mammogram for malignant neoplasm of breast: Secondary | ICD-10-CM

## 2022-12-09 ENCOUNTER — Other Ambulatory Visit: Payer: Self-pay | Admitting: Family

## 2022-12-09 ENCOUNTER — Telehealth: Payer: Commercial Managed Care - PPO | Admitting: Family Medicine

## 2022-12-09 ENCOUNTER — Other Ambulatory Visit (HOSPITAL_BASED_OUTPATIENT_CLINIC_OR_DEPARTMENT_OTHER): Payer: Self-pay

## 2022-12-09 DIAGNOSIS — R519 Headache, unspecified: Secondary | ICD-10-CM

## 2022-12-09 DIAGNOSIS — M546 Pain in thoracic spine: Secondary | ICD-10-CM

## 2022-12-09 MED ORDER — TIZANIDINE HCL 2 MG PO TABS
2.0000 mg | ORAL_TABLET | Freq: Three times a day (TID) | ORAL | 0 refills | Status: DC | PRN
  Filled 2022-12-09: qty 30, 10d supply, fill #0

## 2022-12-09 NOTE — Progress Notes (Signed)
We are sorry that you are not feeling well.  Here is how we plan to help!  Based on what you have shared with me it looks like you mostly have acute back pain.  Acute back pain is defined as musculoskeletal pain that can resolve in 1-3 weeks with conservative treatment.  I have prescribed  Tizanidine 2 mg every eight hours as needed which is a muscle relaxer  Some patients experience stomach irritation or in increased heartburn with anti-inflammatory drugs.  Please keep in mind that muscle relaxer's can cause fatigue and should not be taken while at work or driving.  Back pain is very common.  The pain often gets better over time.  The cause of back pain is usually not dangerous.  Most people can learn to manage their back pain on their own.  Home Care Stay active.  Start with short walks on flat ground if you can.  Try to walk farther each day. Do not sit, drive or stand in one place for more than 30 minutes.  Do not stay in bed. Do not avoid exercise or work.  Activity can help your back heal faster. Be careful when you bend or lift an object.  Bend at your knees, keep the object close to you, and do not twist. Sleep on a firm mattress.  Lie on your side, and bend your knees.  If you lie on your back, put a pillow under your knees. Only take medicines as told by your doctor. Put ice on the injured area. Put ice in a plastic bag Place a towel between your skin and the bag Leave the ice on for 15-20 minutes, 3-4 times a day for the first 2-3 days. 210 After that, you can switch between ice and heat packs. Ask your doctor about back exercises or massage. Avoid feeling anxious or stressed.  Find good ways to deal with stress, such as exercise.  Get Help Right Way If: Your pain does not go away with rest or medicine. Your pain does not go away in 1 week. You have new problems. You do not feel well. The pain spreads into your legs. You cannot control when you poop (bowel movement) or pee  (urinate) You feel sick to your stomach (nauseous) or throw up (vomit) You have belly (abdominal) pain. You feel like you may pass out (faint). If you develop a fever.  Make Sure you: Understand these instructions. Will watch your condition Will get help right away if you are not doing well or get worse.  Your e-visit answers were reviewed by a board certified advanced clinical practitioner to complete your personal care plan.  Depending on the condition, your plan could have included both over the counter or prescription medications.  If there is a problem please reply  once you have received a response from your provider.  Your safety is important to [place].  If you have drug allergies check your prescription carefully.    You can use MyChart to ask questions about today's visit, request a non-urgent call back, or ask for a work or school excuse for 24 hours related to this e-Visit. If it has been greater than 24 hours you will need to follow up with your provider, or enter a new e-Visit to address those concerns.  You will get an e-mail in the next two days asking about your experience.  I hope that your e-visit has been valuable and will speed your recovery. Thank you for using e-visits.  I provided 5 minutes of non face-to-face time during this encounter for chart review, medication and order placement, as well as and documentation.   . 

## 2022-12-10 ENCOUNTER — Other Ambulatory Visit (HOSPITAL_BASED_OUTPATIENT_CLINIC_OR_DEPARTMENT_OTHER): Payer: Self-pay

## 2022-12-10 ENCOUNTER — Other Ambulatory Visit: Payer: Self-pay | Admitting: Family

## 2022-12-10 ENCOUNTER — Ambulatory Visit: Payer: Commercial Managed Care - PPO

## 2022-12-10 DIAGNOSIS — G4733 Obstructive sleep apnea (adult) (pediatric): Secondary | ICD-10-CM

## 2022-12-10 DIAGNOSIS — R519 Headache, unspecified: Secondary | ICD-10-CM

## 2022-12-10 NOTE — Telephone Encounter (Signed)
Requesting: Fioricet  Contract: n/a UDS: n/a Last Visit: 11/10/22 Next Visit: 02/09/23 Last Refill: 11/10/22 #30 and 0RF   Please Advise

## 2022-12-11 ENCOUNTER — Other Ambulatory Visit (HOSPITAL_BASED_OUTPATIENT_CLINIC_OR_DEPARTMENT_OTHER): Payer: Self-pay

## 2022-12-11 MED ORDER — BUTALBITAL-APAP-CAFFEINE 50-325-40 MG PO TABS
1.0000 | ORAL_TABLET | Freq: Four times a day (QID) | ORAL | 0 refills | Status: DC | PRN
  Filled 2022-12-11: qty 30, 8d supply, fill #0

## 2022-12-11 NOTE — Telephone Encounter (Signed)
Requesting: Fioricet  Contract: n/a UDS: n/a Last Visit: 11/10/22 Next Visit: 02/09/23 Last Refill: 11/10/22 #30 and 0RF   Please Advise  

## 2022-12-12 ENCOUNTER — Other Ambulatory Visit (HOSPITAL_BASED_OUTPATIENT_CLINIC_OR_DEPARTMENT_OTHER): Payer: Self-pay

## 2022-12-12 ENCOUNTER — Other Ambulatory Visit (HOSPITAL_COMMUNITY): Payer: Self-pay

## 2022-12-12 MED ORDER — RIZATRIPTAN BENZOATE 10 MG PO TABS
10.0000 mg | ORAL_TABLET | ORAL | 3 refills | Status: DC | PRN
  Filled 2022-12-12: qty 10, 30d supply, fill #0
  Filled 2023-01-18: qty 10, 30d supply, fill #1
  Filled 2023-02-15: qty 10, 30d supply, fill #2
  Filled 2023-04-10 – 2023-04-11 (×2): qty 10, 30d supply, fill #3

## 2022-12-12 NOTE — Addendum Note (Signed)
Addended by: Debbrah Alar on: 12/12/2022 08:06 AM   Modules accepted: Orders

## 2022-12-22 DIAGNOSIS — F4323 Adjustment disorder with mixed anxiety and depressed mood: Secondary | ICD-10-CM | POA: Diagnosis not present

## 2022-12-23 ENCOUNTER — Encounter (HOSPITAL_BASED_OUTPATIENT_CLINIC_OR_DEPARTMENT_OTHER): Payer: Self-pay | Admitting: Pulmonary Disease

## 2022-12-23 ENCOUNTER — Ambulatory Visit (HOSPITAL_BASED_OUTPATIENT_CLINIC_OR_DEPARTMENT_OTHER)
Admission: RE | Admit: 2022-12-23 | Discharge: 2022-12-23 | Disposition: A | Discharge status: Home / Self Care | Payer: Commercial Managed Care - PPO | Source: Ambulatory Visit | Attending: Family | Admitting: Family

## 2022-12-23 ENCOUNTER — Telehealth: Payer: Self-pay | Admitting: Pulmonary Disease

## 2022-12-23 DIAGNOSIS — G4733 Obstructive sleep apnea (adult) (pediatric): Secondary | ICD-10-CM | POA: Diagnosis not present

## 2022-12-23 DIAGNOSIS — Z1231 Encounter for screening mammogram for malignant neoplasm of breast: Secondary | ICD-10-CM | POA: Diagnosis not present

## 2022-12-23 DIAGNOSIS — R0602 Shortness of breath: Secondary | ICD-10-CM

## 2022-12-23 NOTE — Telephone Encounter (Signed)
Home sleep test showed few events which did not reach significance for OSA Or oxygen levels showed mild to moderate drop, overall only 20 minutes less than 88% so do not feel that she needs nocturnal oxygen yet but I am not clear why oxygen level should drop during sleep We may have to check out her heart with echocardiogram, okay to order if she is willing Also please arrange 61-month follow-up visit to reassess

## 2022-12-24 ENCOUNTER — Other Ambulatory Visit (HOSPITAL_BASED_OUTPATIENT_CLINIC_OR_DEPARTMENT_OTHER): Payer: Self-pay

## 2022-12-24 DIAGNOSIS — F909 Attention-deficit hyperactivity disorder, unspecified type: Secondary | ICD-10-CM | POA: Diagnosis not present

## 2022-12-24 DIAGNOSIS — F411 Generalized anxiety disorder: Secondary | ICD-10-CM | POA: Diagnosis not present

## 2022-12-24 DIAGNOSIS — F339 Major depressive disorder, recurrent, unspecified: Secondary | ICD-10-CM | POA: Diagnosis not present

## 2022-12-24 MED ORDER — DULOXETINE HCL 30 MG PO CPEP
90.0000 mg | ORAL_CAPSULE | Freq: Every day | ORAL | 3 refills | Status: DC
  Filled 2023-05-09 (×2): qty 90, 30d supply, fill #0
  Filled 2023-06-29: qty 90, 30d supply, fill #1
  Filled 2023-07-28: qty 90, 30d supply, fill #2
  Filled 2023-09-01: qty 90, 30d supply, fill #3

## 2022-12-24 MED ORDER — AMPHETAMINE-DEXTROAMPHETAMINE 20 MG PO TABS
20.0000 mg | ORAL_TABLET | Freq: Two times a day (BID) | ORAL | 0 refills | Status: DC
  Filled 2023-01-01 – 2023-01-21 (×2): qty 60, 30d supply, fill #0

## 2022-12-24 MED ORDER — BUSPIRONE HCL 30 MG PO TABS
30.0000 mg | ORAL_TABLET | Freq: Two times a day (BID) | ORAL | 3 refills | Status: DC
  Filled 2022-12-24 – 2022-12-26 (×3): qty 60, 30d supply, fill #0
  Filled 2023-11-16: qty 60, 30d supply, fill #1

## 2022-12-24 MED ORDER — PRAMIPEXOLE DIHYDROCHLORIDE 1.5 MG PO TABS
1.5000 mg | ORAL_TABLET | Freq: Every day | ORAL | 3 refills | Status: DC
  Filled 2022-12-25: qty 30, 30d supply, fill #0
  Filled 2023-01-18: qty 30, 30d supply, fill #1
  Filled 2023-08-05: qty 30, 30d supply, fill #2
  Filled 2023-09-01: qty 30, 30d supply, fill #3

## 2022-12-24 MED ORDER — TRAZODONE HCL 100 MG PO TABS
100.0000 mg | ORAL_TABLET | Freq: Every evening | ORAL | 2 refills | Status: DC | PRN
  Filled 2023-01-18: qty 60, 30d supply, fill #0
  Filled 2023-03-13 – 2023-03-14 (×2): qty 60, 30d supply, fill #1
  Filled 2023-06-06: qty 60, 30d supply, fill #2

## 2022-12-24 MED ORDER — AMPHETAMINE-DEXTROAMPHETAMINE 20 MG PO TABS
20.0000 mg | ORAL_TABLET | Freq: Two times a day (BID) | ORAL | 0 refills | Status: DC
  Filled 2022-12-24: qty 60, 30d supply, fill #0

## 2022-12-24 NOTE — Telephone Encounter (Signed)
Please advise on results

## 2022-12-25 ENCOUNTER — Telehealth (HOSPITAL_BASED_OUTPATIENT_CLINIC_OR_DEPARTMENT_OTHER): Payer: Self-pay

## 2022-12-25 ENCOUNTER — Other Ambulatory Visit: Payer: Self-pay

## 2022-12-25 ENCOUNTER — Other Ambulatory Visit (HOSPITAL_BASED_OUTPATIENT_CLINIC_OR_DEPARTMENT_OTHER): Payer: Self-pay

## 2022-12-25 ENCOUNTER — Other Ambulatory Visit (HOSPITAL_COMMUNITY): Payer: Self-pay

## 2022-12-25 ENCOUNTER — Encounter (HOSPITAL_BASED_OUTPATIENT_CLINIC_OR_DEPARTMENT_OTHER): Payer: Self-pay | Admitting: Pharmacist

## 2022-12-25 MED ORDER — FUSION PLUS PO CAPS
1.0000 | ORAL_CAPSULE | Freq: Every day | ORAL | 0 refills | Status: DC
  Filled 2022-12-25 (×2): qty 30, 30d supply, fill #0

## 2022-12-25 MED ORDER — ESOMEPRAZOLE MAGNESIUM 40 MG PO CPDR
40.0000 mg | DELAYED_RELEASE_CAPSULE | Freq: Every day | ORAL | 0 refills | Status: DC
  Filled 2022-12-25 (×2): qty 90, 90d supply, fill #0

## 2022-12-25 MED ORDER — FAMOTIDINE 40 MG PO TABS
20.0000 mg | ORAL_TABLET | Freq: Two times a day (BID) | ORAL | 0 refills | Status: DC
  Filled 2022-12-25 (×2): qty 60, 60d supply, fill #0

## 2022-12-25 NOTE — Telephone Encounter (Signed)
LMOM for Pt to return call for HST results. Dr Elsworth Soho has the results in a phone note from 12/23/2022.

## 2022-12-26 ENCOUNTER — Other Ambulatory Visit (HOSPITAL_BASED_OUTPATIENT_CLINIC_OR_DEPARTMENT_OTHER): Payer: Self-pay

## 2023-01-01 ENCOUNTER — Other Ambulatory Visit (HOSPITAL_BASED_OUTPATIENT_CLINIC_OR_DEPARTMENT_OTHER): Payer: Self-pay

## 2023-01-05 DIAGNOSIS — F4323 Adjustment disorder with mixed anxiety and depressed mood: Secondary | ICD-10-CM | POA: Diagnosis not present

## 2023-01-09 ENCOUNTER — Telehealth: Payer: Self-pay

## 2023-01-09 NOTE — Telephone Encounter (Signed)
Called pt to confirm appointment for tomorrow.   Left message for pt to call and confirm appointment by 1500 01/12/23

## 2023-01-12 ENCOUNTER — Telehealth: Payer: Self-pay

## 2023-01-12 NOTE — Telephone Encounter (Signed)
Called pt to confirm appointment for tomorrow.  Pt verified appt date/time.

## 2023-01-13 ENCOUNTER — Ambulatory Visit (INDEPENDENT_AMBULATORY_CARE_PROVIDER_SITE_OTHER): Payer: Commercial Managed Care - PPO | Admitting: Obstetrics & Gynecology

## 2023-01-13 ENCOUNTER — Other Ambulatory Visit (HOSPITAL_COMMUNITY)
Admission: RE | Admit: 2023-01-13 | Discharge: 2023-01-13 | Disposition: A | Discharge status: Home / Self Care | Payer: Commercial Managed Care - PPO | Source: Ambulatory Visit | Attending: Obstetrics & Gynecology | Admitting: Obstetrics & Gynecology

## 2023-01-13 ENCOUNTER — Encounter: Payer: Self-pay | Admitting: Obstetrics & Gynecology

## 2023-01-13 VITALS — BP 111/75 | HR 88 | Ht 67.0 in | Wt 216.0 lb

## 2023-01-13 DIAGNOSIS — Z01419 Encounter for gynecological examination (general) (routine) without abnormal findings: Secondary | ICD-10-CM | POA: Diagnosis not present

## 2023-01-13 DIAGNOSIS — N393 Stress incontinence (female) (male): Secondary | ICD-10-CM

## 2023-01-13 NOTE — Progress Notes (Signed)
GYNECOLOGY ANNUAL PREVENTATIVE CARE ENCOUNTER NOTE  History:     [name] is a 47 y.o. G29P1102 female here for a routine annual gynecologic exam.   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  Current complaints: stress urinary incontinence, desires referral to Urogynecology.   Reports non-debilitating perimenopausal symptoms and skipping periods.  Denies other abnormal vaginal bleeding, discharge, pelvic pain, problems with intercourse or other gynecologic concerns.    Gynecologic History Patient's last menstrual period was 12/09/2022 (approximate). Contraception: tubal ligation with Essure Last Pap: Three years ago at [facility]. Result was normal with negative HPV Last Mammogram: 12/24/2022.  Result was normal Last Colonoscopy: 04/25/2022.  Result was normal  Obstetric History OB History  Gravida Para Term Preterm AB Living  2 2 1 1 $ 0 2  SAB IAB Ectopic Multiple Live Births  0 0 0 0 1    # Outcome Date GA Lbr Len/2nd Weight Sex Delivery Anes PTL Lv  2 Preterm 10/15/11 [id number]:23 / 00:21 6 lb 0.5 oz (2.735 kg)  Vag-Spont EPI  LIV  1 Term             Past Medical History:  Diagnosis Date   ADHD (attention deficit hyperactivity disorder)    Anemia    Depression    GERD (gastroesophageal reflux disease)    Headache(784.0)    Hypertension    IBS (irritable bowel syndrome)    Incisional hernia    SVD (spontaneous vaginal delivery) 10/17/2011   Ulcer     Past Surgical History:  Procedure Laterality Date   CHOLECYSTECTOMY  11/24/2004   ESOPHAGOGASTRODUODENOSCOPY  11/28/2011   Procedure: ESOPHAGOGASTRODUODENOSCOPY (EGD);  Surgeon: PBeryle Beams MD;  [facility];  Service: Endoscopy;  Laterality: N/A;   lap repair gastrojejunal anastomotic perforation  02/21/2011   Dr WRedmond Pulling  LAPAROSCOPIC GASTROTOMY W/ REPAIR OF ULCER  10/23/2009   ROUX-EN-Y GASTRIC BYPASS  11/25/2003   SMALL BOWEL REPAIR  06/05/2022   VENTRAL HERNIA REPAIR  12/22/2011   Procedure: HERNIA  REPAIR VENTRAL ADULT;  Surgeon: MPedro Earls MD;  Location: [facility];  Service: General;;    Current Outpatient Medications on File Prior to Visit  Medication Sig Dispense Refill   acetaminophen (TYLENOL) 500 MG tablet Take 1,000 mg by mouth every 6 (six) hours as needed. For pain     amphetamine-dextroamphetamine (ADDERALL) 20 MG tablet Take one tablet twice daily 60 tablet 0   amphetamine-dextroamphetamine (ADDERALL) 20 MG tablet Take 1 tablet (20 mg total) by mouth 2 (two) times daily. 60 tablet 0   busPIRone (BUSPAR) 30 MG tablet Take 1 tablet (30 mg total) by mouth 2 (two) times daily with a meal. 60 tablet 3   busPIRone (BUSPAR) 30 MG tablet Take 1 tablet (30 mg total) by mouth 2 (two) times daily with food. 60 tablet 3   Calcium Carb-Cholecalciferol (CHEWABLE CALCIUM/D3 PO)      cyanocobalamin 500 MCG tablet Take 500 mcg by mouth daily.     DULoxetine (CYMBALTA) 30 MG capsule Take 3 capsules (90 mg total) by mouth daily. 90 capsule 3   DULoxetine (CYMBALTA) 30 MG capsule Take 3 capsules (90 mg total) by mouth daily. 90 capsule 3   DULoxetine HCl 30 MG CSDR      esomeprazole (NEXIUM) 40 MG capsule Take 1 capsule (40 mg total) by mouth daily. 90 capsule 0   esomeprazole (NEXIUM) 40 MG packet NexIUM     famotidine (PEPCID) 40 MG tablet Take  40 mg by mouth daily.     famotidine (PEPCID) 40 MG tablet Take 0.5 tablets (20 mg total) by mouth 2 (two) times daily. 60 tablet 0   Iron-FA-B Cmp-C-Biot-Probiotic (FUSION PLUS) CAPS      Iron-FA-B Cmp-C-Biot-Probiotic (FUSION PLUS) CAPS Take 1 capsule by mouth daily. 30 capsule 0   LORazepam (ATIVAN) 1 MG tablet Take 1 tablet (1 mg total) by mouth every 12 (twelve) hours as needed for severe anxiety. 60 tablet 3   pramipexole (MIRAPEX) 1.5 MG tablet Take 1 tablet (1.5 mg total) by mouth at bedtime. 30 tablet 3   pramipexole (MIRAPEX) 1.5 MG tablet Take 1 tablet (1.5 mg total) by mouth at bedtime. 30 tablet 3   rizatriptan (MAXALT) 10 MG tablet  Take 1 tablet (10 mg total) by mouth as needed for migraine. May repeat in 2 hours if needed 10 tablet 3   tiZANidine (ZANAFLEX) 2 MG tablet Take 1 tablet (2 mg total) by mouth every 8 (eight) hours as needed for muscle spasms. 30 tablet 0   traZODone (DESYREL) 100 MG tablet Take 1-2 tablets (100-200 mg total) by mouth at bedtime as needed for insomnia. 60 tablet 2   traZODone (DESYREL) 100 MG tablet Take 1-2 tablets (100-200 mg total) by mouth at bedtime as needed for insomnia. 60 tablet 2   traZODone (DESYREL) 100 MG tablet Take 1-2 tablets (100-200 mg total) by mouth at bedtime as needed for insomnia. 60 tablet 2   modafinil (PROVIGIL) 200 MG tablet Take 0.5 tablet (100 mg) by mouth every morning for 7 days, THEN increase to 1 tablet (200 mg) every morning. 30 tablet 2   No current facility-administered medications on file prior to visit.    No Known Allergies  Social History:  reports that she has never smoked. She has been exposed to tobacco smoke. She has never used smokeless tobacco. She reports that she does not drink alcohol and does not use drugs.  Family History  Problem Relation Age of Onset   CVA Mother    Heart disease Mother        fibroelastoma on her heart, CAD, CABG   Cerebral aneurysm Mother    COPD Father    Liver cancer Father    Alcohol abuse Father    Drug abuse Brother    Alcohol abuse Brother    Drug abuse Brother    Alcohol abuse Brother    Heart disease Maternal Grandmother    Aneurysm Maternal Grandfather    Diabetes Paternal Grandmother    Drug abuse Son        in recovery   Asthma Son    Depression Son    ADD / ADHD Son    Anxiety disorder Son    Tourette syndrome Son    Anesthesia problems Neg Hx    Hypotension Neg Hx    Malignant hyperthermia Neg Hx    Pseudochol deficiency Neg Hx    Stroke Neg Hx     The following portions of the patient's history were reviewed and updated as appropriate: allergies, current medications, past family history,  past medical history, past social history, past surgical history and problem list.  Review of Systems Pertinent items noted in HPI and remainder of comprehensive ROS otherwise negative.  Physical Exam:  BP 111/75   Pulse 88   Ht 5' 7"$  (1.702 m)   Wt 216 lb (98 kg)   LMP 12/09/2022 (Approximate)   BMI 33.83 kg/m  CONSTITUTIONAL: Well-developed, well-nourished female in  no acute distress.  HENT:  Normocephalic, atraumatic, External right and left ear normal.  EYES: Conjunctivae and EOM are normal. Pupils are equal, round, and reactive to light. No scleral icterus.  NECK: Normal range of motion, supple, no masses.  Normal thyroid.  SKIN: Skin is warm and dry. No rash noted. Not diaphoretic. No erythema. No pallor. MUSCULOSKELETAL: Normal range of motion. No tenderness.  No cyanosis, clubbing, or edema. NEUROLOGIC: Alert and oriented to person, place, and time. Normal reflexes, muscle tone coordination.  PSYCHIATRIC: Normal mood and affect. Normal behavior. Normal judgment and thought content. CARDIOVASCULAR: Normal heart rate noted, regular rhythm RESPIRATORY: Clear to auscultation bilaterally. Effort and breath sounds normal, no problems with respiration noted. BREASTS: Symmetric in size. No masses, tenderness, skin changes, nipple drainage, or lymphadenopathy bilaterally. Performed in the presence of a chaperone. ABDOMEN: Soft, no distention noted.  No tenderness, rebound or guarding.  PELVIC: Normal appearing external genitalia and urethral meatus; normal appearing vaginal mucosa and cervix.  No abnormal vaginal discharge noted.  Pap smear obtained.  Normal uterine size, no other palpable masses, no uterine or adnexal tenderness.  Performed in the presence of a chaperone.   Assessment and Plan:    1. Stress incontinence in female - Ambulatory referral to Urogynecology made as per her request.  2. Well woman exam with routine gynecological exam - Cytology - PAP Will follow up results  of pap smear and manage accordingly. Mammogram and colon cancer screening are up to date. Routine preventative health maintenance measures emphasized. Please refer to After Visit Summary for other counseling recommendations.      Verita Schneiders, MD, [place] [facility], [place]

## 2023-01-15 ENCOUNTER — Inpatient Hospital Stay (HOSPITAL_COMMUNITY): Admit: 2023-01-15 | Payer: Self-pay

## 2023-01-15 ENCOUNTER — Encounter: Payer: Self-pay | Admitting: Obstetrics & Gynecology

## 2023-01-15 ENCOUNTER — Telehealth: Payer: Self-pay

## 2023-01-15 LAB — CYTOLOGY - PAP
Comment: NEGATIVE
Diagnosis: NEGATIVE
High risk HPV: NEGATIVE

## 2023-01-15 NOTE — Telephone Encounter (Signed)
Attempted to reach patient regarding referral. Left voicemail for patient to call office back to schedule urogyn appt.

## 2023-01-18 ENCOUNTER — Other Ambulatory Visit (HOSPITAL_BASED_OUTPATIENT_CLINIC_OR_DEPARTMENT_OTHER): Payer: Self-pay

## 2023-01-19 ENCOUNTER — Other Ambulatory Visit (HOSPITAL_BASED_OUTPATIENT_CLINIC_OR_DEPARTMENT_OTHER): Payer: Self-pay

## 2023-01-19 ENCOUNTER — Other Ambulatory Visit: Payer: Self-pay

## 2023-01-21 ENCOUNTER — Other Ambulatory Visit (HOSPITAL_BASED_OUTPATIENT_CLINIC_OR_DEPARTMENT_OTHER): Payer: Self-pay

## 2023-01-21 DIAGNOSIS — F909 Attention-deficit hyperactivity disorder, unspecified type: Secondary | ICD-10-CM | POA: Diagnosis not present

## 2023-01-21 DIAGNOSIS — F411 Generalized anxiety disorder: Secondary | ICD-10-CM | POA: Diagnosis not present

## 2023-01-21 DIAGNOSIS — F339 Major depressive disorder, recurrent, unspecified: Secondary | ICD-10-CM | POA: Diagnosis not present

## 2023-01-21 MED ORDER — PRAMIPEXOLE DIHYDROCHLORIDE 1.5 MG PO TABS
1.5000 mg | ORAL_TABLET | Freq: Every day | ORAL | 3 refills | Status: DC
  Filled 2023-01-21 – 2023-03-14 (×3): qty 30, 30d supply, fill #0
  Filled 2023-04-10 – 2023-04-11 (×2): qty 30, 30d supply, fill #1
  Filled 2023-07-16: qty 30, 30d supply, fill #2

## 2023-01-21 MED ORDER — AMPHETAMINE-DEXTROAMPHETAMINE 20 MG PO TABS
20.0000 mg | ORAL_TABLET | Freq: Two times a day (BID) | ORAL | 0 refills | Status: DC
  Filled 2023-03-22: qty 60, 30d supply, fill #0

## 2023-01-21 MED ORDER — DULOXETINE HCL 30 MG PO CPEP
90.0000 mg | ORAL_CAPSULE | Freq: Every day | ORAL | 3 refills | Status: DC
  Filled 2023-01-21: qty 49, 17d supply, fill #0
  Filled 2023-01-21: qty 41, 13d supply, fill #0

## 2023-01-21 MED ORDER — AMPHETAMINE-DEXTROAMPHETAMINE 20 MG PO TABS
20.0000 mg | ORAL_TABLET | Freq: Two times a day (BID) | ORAL | 0 refills | Status: DC
  Filled 2023-01-21 – 2023-03-13 (×3): qty 60, 30d supply, fill #0

## 2023-01-21 MED ORDER — TRAZODONE HCL 100 MG PO TABS
100.0000 mg | ORAL_TABLET | Freq: Every evening | ORAL | 2 refills | Status: DC | PRN
  Filled 2023-01-21 – 2023-04-11 (×3): qty 60, 30d supply, fill #0
  Filled 2023-07-06: qty 60, 30d supply, fill #1
  Filled 2023-07-16 – 2023-08-05 (×2): qty 60, 30d supply, fill #2

## 2023-01-21 MED ORDER — BUSPIRONE HCL 30 MG PO TABS
30.0000 mg | ORAL_TABLET | Freq: Two times a day (BID) | ORAL | 3 refills | Status: DC
  Filled 2023-01-21: qty 60, 30d supply, fill #0

## 2023-01-21 MED ORDER — AMPHETAMINE-DEXTROAMPHETAMINE 20 MG PO TABS
ORAL_TABLET | ORAL | 0 refills | Status: DC
  Filled 2023-01-21: qty 60, 30d supply, fill #0
  Filled 2023-02-15: qty 60, fill #0
  Filled 2023-02-18: qty 60, 30d supply, fill #0

## 2023-01-22 ENCOUNTER — Other Ambulatory Visit: Payer: Self-pay

## 2023-01-26 ENCOUNTER — Encounter: Payer: Self-pay | Admitting: Obstetrics & Gynecology

## 2023-01-26 ENCOUNTER — Other Ambulatory Visit: Payer: Self-pay | Admitting: *Deleted

## 2023-01-26 DIAGNOSIS — N393 Stress incontinence (female) (male): Secondary | ICD-10-CM

## 2023-01-27 ENCOUNTER — Telehealth: Payer: Self-pay

## 2023-01-27 NOTE — Telephone Encounter (Signed)
Notified pt of referral placed to Dr. Hanley Seamen office.

## 2023-02-01 ENCOUNTER — Encounter (INDEPENDENT_AMBULATORY_CARE_PROVIDER_SITE_OTHER): Payer: Self-pay

## 2023-02-09 ENCOUNTER — Encounter: Payer: Commercial Managed Care - PPO | Admitting: Family

## 2023-02-13 ENCOUNTER — Ambulatory Visit (INDEPENDENT_AMBULATORY_CARE_PROVIDER_SITE_OTHER): Payer: Commercial Managed Care - PPO | Admitting: Family

## 2023-02-13 ENCOUNTER — Encounter: Payer: Self-pay | Admitting: Family

## 2023-02-13 ENCOUNTER — Other Ambulatory Visit (HOSPITAL_BASED_OUTPATIENT_CLINIC_OR_DEPARTMENT_OTHER): Payer: Self-pay

## 2023-02-13 VITALS — BP 126/78 | HR 96 | Temp 98.1°F | Resp 16 | Ht 67.0 in | Wt 225.0 lb

## 2023-02-13 DIAGNOSIS — E611 Iron deficiency: Secondary | ICD-10-CM

## 2023-02-13 DIAGNOSIS — K219 Gastro-esophageal reflux disease without esophagitis: Secondary | ICD-10-CM | POA: Diagnosis not present

## 2023-02-13 DIAGNOSIS — Z0000 Encounter for general adult medical examination without abnormal findings: Secondary | ICD-10-CM | POA: Diagnosis not present

## 2023-02-13 LAB — CBC WITH DIFFERENTIAL/PLATELET
Basophils Absolute: 0.1 10*3/uL (ref 0.0–0.1)
Basophils Relative: 1 % (ref 0.0–3.0)
Eosinophils Absolute: 0.1 10*3/uL (ref 0.0–0.7)
Eosinophils Relative: 2.1 % (ref 0.0–5.0)
HCT: 40.7 % (ref 36.0–46.0)
Hemoglobin: 13.7 g/dL (ref 12.0–15.0)
Lymphocytes Relative: 27.9 % (ref 12.0–46.0)
Lymphs Abs: 1.7 10*3/uL (ref 0.7–4.0)
MCHC: 33.6 g/dL (ref 30.0–36.0)
MCV: 94.3 fl (ref 78.0–100.0)
Monocytes Absolute: 0.7 10*3/uL (ref 0.1–1.0)
Monocytes Relative: 10.8 % (ref 3.0–12.0)
Neutro Abs: 3.5 10*3/uL (ref 1.4–7.7)
Neutrophils Relative %: 58.2 % (ref 43.0–77.0)
Platelets: 283 10*3/uL (ref 150.0–400.0)
RBC: 4.32 Mil/uL (ref 3.87–5.11)
RDW: 13.3 % (ref 11.5–15.5)
WBC: 6.1 10*3/uL (ref 4.0–10.5)

## 2023-02-13 LAB — TSH: TSH: 1.49 u[IU]/mL (ref 0.35–5.50)

## 2023-02-13 LAB — LIPID PANEL
Cholesterol: 159 mg/dL (ref 0–200)
HDL: 61.6 mg/dL (ref >39.00)
LDL Cholesterol: 69 mg/dL (ref 0–99)
NonHDL: 97.89
Total CHOL/HDL Ratio: 3
Triglycerides: 144 mg/dL (ref 0.0–149.0)
VLDL: 28.8 mg/dL (ref 0.0–40.0)

## 2023-02-13 MED ORDER — ESOMEPRAZOLE MAGNESIUM 40 MG PO CPDR
40.0000 mg | DELAYED_RELEASE_CAPSULE | Freq: Every day | ORAL | 4 refills | Status: DC
  Filled 2023-02-13 – 2023-03-20 (×2): qty 90, 90d supply, fill #0
  Filled 2023-09-01 – 2023-09-04 (×2): qty 90, 90d supply, fill #1
  Filled 2023-12-02: qty 90, 90d supply, fill #2

## 2023-02-13 MED ORDER — FAMOTIDINE 40 MG PO TABS
20.0000 mg | ORAL_TABLET | Freq: Two times a day (BID) | ORAL | 0 refills | Status: DC
  Filled 2023-02-13: qty 60, 60d supply, fill #0

## 2023-02-13 MED ORDER — FUSION PLUS PO CAPS
1.0000 | ORAL_CAPSULE | Freq: Every day | ORAL | 4 refills | Status: DC
  Filled 2023-02-13: qty 90, 90d supply, fill #0
  Filled 2023-09-01 – 2023-09-04 (×2): qty 90, 90d supply, fill #1

## 2023-02-13 NOTE — Assessment & Plan Note (Signed)
Continues iron supplementation. Will obtain follow up iron studies.

## 2023-02-13 NOTE — Progress Notes (Signed)
Subjective:   By signing my name below, I, Madelin Rear, attest that this documentation has been prepared under the direction and in the presence of Debbrah Alar, NP. 02/13/2023.   Patient ID: [name], female    DOB: November 25, 1975, 47 y.o.   MRN: [id number]  Chief Complaint  Patient presents with   Annual Exam    Annual Exam    HPI Patient is in today for a comprehensive physical exam.  Refills:  She is requesting refills of Pepcid, esomeprazole, and her iron supplement. Esomeprazole has been managing her acid reflux well.  Cracked lips:  For the past few months she complains of cracked, dry lips.  Headaches:  These have been improving and now occur maybe once a week.  Sleep:  She reports that her last sleep study was negative. Currently sleeping better with a body pillow. She has also cut back on her caffeine intake.  GYN:  She endorses irregular menses, sometimes heavier. Associated with hot flashes. She also complains of stress incontinence and has been referred to [facility] in [place].  Mood:  She continues to work with her psychiatrist. Current medications include adderall, buspar, cymbalta, pramipexole, and trazodone.  Social history:  Bowel resection performed 06/05/2022; no other recent surgeries. No changes in her family medical history.  Colonoscopy:  Last completed 04/25/2022. No digestive concerns.  Pap Smear:  Last completed 01/13/2023.  Mammogram:  Last completed 12/23/2022.  Immunizations:  Covid-19 vaccine last received 08/17/2021. Tdap last received 10/05/2021. Influenza vaccine last received 08/16/2022. Next year she will be a candidate for Shingrix.  Diet:  She states that her diet is "not great."  Exercise:  She does want to try to increase her physical activity. She is working every Saturday and Sunday for the day shift, and one day shift during the week. We discussed focusing on exercise during her days off.  Dental/Vision:  She  reports that she is UTD on her examinations.  Denies having any fever, new muscle pain, joint pain, new moles, congestion, sinus pain, sore throat, chest pain, palpitations, cough, SOB, wheezing, n/v/d, constipation, blood in stool, dysuria, frequency, hematuria, at this time.  Past Medical History:  Diagnosis Date   ADHD (attention deficit hyperactivity disorder)    Anemia    Depression    GERD (gastroesophageal reflux disease)    Headache(784.0)    Hypertension    IBS (irritable bowel syndrome)    Incisional hernia    SVD (spontaneous vaginal delivery) 10/17/2011   Ulcer     Past Surgical History:  Procedure Laterality Date   BOWEL RESECTION  06/05/2022   due to intususception   CHOLECYSTECTOMY  11/24/2004   ESOPHAGOGASTRODUODENOSCOPY  11/28/2011   Procedure: ESOPHAGOGASTRODUODENOSCOPY (EGD);  Surgeon: Beryle Beams, MD;  Location: [facility];  Service: Endoscopy;  Laterality: N/A;   lap repair gastrojejunal anastomotic perforation  02/21/2011   Dr Redmond Pulling   LAPAROSCOPIC GASTROTOMY W/ REPAIR OF ULCER  10/23/2009   ROUX-EN-Y GASTRIC BYPASS  11/25/2003   SMALL BOWEL REPAIR  06/05/2022   VENTRAL HERNIA REPAIR  12/22/2011   Procedure: HERNIA REPAIR VENTRAL ADULT;  Surgeon: Pedro Earls, MD;  Location: [facility];  Service: General;;    Family History  Problem Relation Age of Onset   CVA Mother    Heart disease Mother        fibroelastoma on her heart, CAD, CABG   Cerebral aneurysm Mother    COPD Father    Liver cancer Father  Alcohol abuse Father    Drug abuse Brother    Alcohol abuse Brother    Drug abuse Brother    Alcohol abuse Brother    Heart disease Maternal Grandmother    Aneurysm Maternal Grandfather    Diabetes Paternal Grandmother    Drug abuse Son        in recovery   Asthma Son    Depression Son    ADD / ADHD Son    Anxiety disorder Son    Tourette syndrome Son    Anesthesia problems Neg Hx    Hypotension Neg Hx    Malignant hyperthermia Neg Hx     Pseudochol deficiency Neg Hx    Stroke Neg Hx     Social History   Socioeconomic History   Marital status: Married    Spouse name: Not on file   Number of children: Not on file   Years of education: Not on file   Highest education level: Not on file  Occupational History   Not on file  Tobacco Use   Smoking status: Never    Passive exposure: Past   Smokeless tobacco: Never  Vaping Use   Vaping Use: Never used  Substance and Sexual Activity   Alcohol use: No   Drug use: No   Sexual activity: Yes    Birth control/protection: Surgical    Comment: esure  Other Topics Concern   Not on file  Social History Narrative   Married   2 sons (one is grown)   One son at home   Step daughter at home    2 step sons out of the house   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    Social Determinants of Health   Financial Resource Strain: Not on file  Food Insecurity: Not on file  Transportation Needs: Not on file  Physical Activity: Insufficiently Active (11/10/2022)   Exercise Vital Sign    Days of Exercise per Week: 2 days    Minutes of Exercise per Session: 60 min  Stress: Not on file  Social Connections: Not on file  Intimate Partner Violence: Not on file    Outpatient Medications Prior to Visit  Medication Sig Dispense Refill   acetaminophen (TYLENOL) 500 MG tablet Take 1,000 mg by mouth every 6 (six) hours as needed. For pain     amphetamine-dextroamphetamine (ADDERALL) 20 MG tablet Take one tablet twice daily 60 tablet 0   amphetamine-dextroamphetamine (ADDERALL) 20 MG tablet Take 1 tablet (20 mg total) by mouth 2 (two) times daily. 60 tablet 0   amphetamine-dextroamphetamine (ADDERALL) 20 MG tablet Take 1 tablet (20 mg total) by mouth 2 (two) times daily. 60 tablet 0   amphetamine-dextroamphetamine (ADDERALL) 20 MG tablet Take one tablet twice daily, Do not fill until 30 days from rx written 60  tablet 0   [START ON 03/22/2023] amphetamine-dextroamphetamine (ADDERALL) 20 MG tablet Take 1 tablet (20 mg total) by mouth 2 (two) times daily. 60 tablet 0   busPIRone (BUSPAR) 30 MG tablet Take 1 tablet (30 mg total) by mouth 2 (two) times daily with a meal. 60 tablet 3   busPIRone (BUSPAR) 30 MG tablet Take 1 tablet (30 mg total) by mouth 2 (two) times daily with food. 60 tablet 3   busPIRone (BUSPAR) 30 MG tablet Take 1 tablet (30 mg total) by mouth 2 (two) times daily with food 60 tablet 3   Calcium Carb-Cholecalciferol (  CHEWABLE CALCIUM/D3 PO)      cyanocobalamin 500 MCG tablet Take 500 mcg by mouth daily.     DULoxetine (CYMBALTA) 30 MG capsule Take 3 capsules (90 mg total) by mouth daily. 90 capsule 3   DULoxetine (CYMBALTA) 30 MG capsule Take 3 capsules (90 mg total) by mouth daily. 90 capsule 3   DULoxetine (CYMBALTA) 30 MG capsule Take 3 capsules (90 mg total) by mouth daily. 90 capsule 3   DULoxetine HCl 30 MG CSDR      esomeprazole (NEXIUM) 40 MG packet NexIUM     Iron-FA-B Cmp-C-Biot-Probiotic (FUSION PLUS) CAPS      LORazepam (ATIVAN) 1 MG tablet Take 1 tablet (1 mg total) by mouth every 12 (twelve) hours as needed for severe anxiety. 60 tablet 3   pramipexole (MIRAPEX) 1.5 MG tablet Take 1 tablet (1.5 mg total) by mouth at bedtime. 30 tablet 3   pramipexole (MIRAPEX) 1.5 MG tablet Take 1 tablet (1.5 mg total) by mouth at bedtime. 30 tablet 3   pramipexole (MIRAPEX) 1.5 MG tablet Take 1 tablet (1.5 mg total) by mouth at bedtime. 30 tablet 3   rizatriptan (MAXALT) 10 MG tablet Take 1 tablet (10 mg total) by mouth as needed for migraine. May repeat in 2 hours if needed 10 tablet 3   tiZANidine (ZANAFLEX) 2 MG tablet Take 1 tablet (2 mg total) by mouth every 8 (eight) hours as needed for muscle spasms. 30 tablet 0   traZODone (DESYREL) 100 MG tablet Take 1-2 tablets (100-200 mg total) by mouth at bedtime as needed for insomnia. 60 tablet 2   traZODone (DESYREL) 100 MG tablet Take 1-2  tablets (100-200 mg total) by mouth at bedtime as needed for insomnia. 60 tablet 2   traZODone (DESYREL) 100 MG tablet Take 1-2 tablets (100-200 mg total) by mouth at bedtime as needed for insomnia 60 tablet 2   esomeprazole (NEXIUM) 40 MG capsule Take 1 capsule (40 mg total) by mouth daily. 90 capsule 0   famotidine (PEPCID) 40 MG tablet Take 40 mg by mouth daily.     famotidine (PEPCID) 40 MG tablet Take 0.5 tablets (20 mg total) by mouth 2 (two) times daily. 60 tablet 0   Iron-FA-B Cmp-C-Biot-Probiotic (FUSION PLUS) CAPS Take 1 capsule by mouth daily. 30 capsule 0   traZODone (DESYREL) 100 MG tablet Take 1-2 tablets (100-200 mg total) by mouth at bedtime as needed for insomnia. 60 tablet 2   No facility-administered medications prior to visit.    No Known Allergies  Review of Systems  Constitutional:  Negative for fever.  HENT:  Negative for congestion, sinus pain and sore throat.   Respiratory:  Negative for cough, shortness of breath and wheezing.   Cardiovascular:  Negative for chest pain and palpitations.  Gastrointestinal:  Negative for blood in stool, constipation, diarrhea, nausea and vomiting.  Genitourinary:  Negative for dysuria, frequency and hematuria.       +Stress incontinence.  Musculoskeletal:  Negative for joint pain and myalgias.  Skin:        +Cracked, dry lips  Neurological:  Positive for headaches.       Objective:    Physical Exam Constitutional:      Appearance: Normal appearance.  HENT:     Head: Normocephalic and atraumatic.     Right Ear: Tympanic membrane, ear canal and external ear normal.     Left Ear: Tympanic membrane, ear canal and external ear normal.  Eyes:     Extraocular Movements:  Extraocular movements intact.     Pupils: Pupils are equal, round, and reactive to light.  Cardiovascular:     Rate and Rhythm: Normal rate and regular rhythm.     Heart sounds: Normal heart sounds. No murmur heard.    No gallop.  Pulmonary:     Effort:  Pulmonary effort is normal. No respiratory distress.     Breath sounds: Normal breath sounds. No wheezing or rales.  Abdominal:     General: Bowel sounds are normal. There is no distension.     Palpations: Abdomen is soft.     Tenderness: There is no abdominal tenderness. There is no guarding.  Musculoskeletal:        General: Normal range of motion.     Cervical back: Neck supple.  Skin:    General: Skin is warm and dry.  Neurological:     General: No focal deficit present.     Mental Status: She is alert and oriented to person, place, and time.     Deep Tendon Reflexes:     Reflex Scores:      Patellar reflexes are 1+ on the right side and 1+ on the left side. Psychiatric:        Mood and Affect: Mood normal.        Behavior: Behavior normal.     BP 126/78 (BP Location: Right Arm, Patient Position: Sitting, Cuff Size: Normal)   Pulse 96   Temp 98.1 F (36.7 C) (Oral)   Resp 16   Ht 5\' 7"  (1.702 m)   Wt 225 lb (102.1 kg)   SpO2 100%   BMI 35.24 kg/m  Wt Readings from Last 3 Encounters:  02/13/23 225 lb (102.1 kg)  01/13/23 216 lb (98 kg)  12/01/22 224 lb 6.4 oz (101.8 kg)      Assessment & Plan:   Problem List Items Addressed This Visit       Unprioritized   Preventative health care - Primary    Discussed healthy diet, exercise, weight loss. Colo, pap and mammo up to date. Tetanus/flu shot up to date.       Relevant Orders   Lipid panel   TSH   Iron deficiency    Continues iron supplementation. Will obtain follow up iron studies.        Relevant Orders   Iron, TIBC and Ferritin Panel   CBC w/Diff   Gastroesophageal reflux disease    Reports that symptoms remain controlled on pepcid and nexium. She has hx of gastric bypass. Continue same.       Relevant Medications   famotidine (PEPCID) 40 MG tablet   esomeprazole (NEXIUM) 40 MG capsule     Meds ordered this encounter  Medications   famotidine (PEPCID) 40 MG tablet    Sig: Take 0.5 tablets (20  mg total) by mouth 2 (two) times daily.    Dispense:  60 tablet    Refill:  0    Order Specific Question:   Supervising Provider    Answer:   Penni Homans A [[id number]]   Iron-FA-B Cmp-C-Biot-Probiotic (FUSION PLUS) CAPS    Sig: Take 1 capsule by mouth daily.    Dispense:  90 capsule    Refill:  4    Order Specific Question:   Supervising Provider    Answer:   Penni Homans A [[id number]]   esomeprazole (NEXIUM) 40 MG capsule    Sig: Take 1 capsule (40 mg total) by mouth daily.    Dispense:  90 capsule    Refill:  4    Order Specific Question:   Supervising Provider    Answer:   Penni Homans A [[id number]]    I, Nance Pear, NP, personally preformed the services described in this documentation.  All medical record entries made by the scribe were at my direction and in my presence.  I have reviewed the chart and discharge instructions (if applicable) and agree that the record reflects my personal performance and is accurate and complete. 02/13/2023.  I,Mathew Stumpf,acting as a Education administrator for [employer], NP.,have documented all relevant documentation on the behalf of Nance Pear, NP,as directed by  Nance Pear, NP while in the presence of Nance Pear, NP.   Nance Pear, NP

## 2023-02-13 NOTE — Assessment & Plan Note (Signed)
Reports that symptoms remain controlled on pepcid and nexium. She has hx of gastric bypass. Continue same.

## 2023-02-13 NOTE — Assessment & Plan Note (Signed)
Discussed healthy diet, exercise, weight loss. Colo, pap and mammo up to date. Tetanus/flu shot up to date.

## 2023-02-14 LAB — IRON,TIBC AND FERRITIN PANEL
%SAT: 33 % (calc) (ref 16–45)
Ferritin: 8 ng/mL — ABNORMAL LOW (ref 16–232)
Iron: 108 ug/dL (ref 40–190)
TIBC: 332 mcg/dL (calc) (ref 250–450)

## 2023-02-15 ENCOUNTER — Other Ambulatory Visit (HOSPITAL_BASED_OUTPATIENT_CLINIC_OR_DEPARTMENT_OTHER): Payer: Self-pay

## 2023-02-16 ENCOUNTER — Other Ambulatory Visit (HOSPITAL_BASED_OUTPATIENT_CLINIC_OR_DEPARTMENT_OTHER): Payer: Self-pay

## 2023-02-16 ENCOUNTER — Other Ambulatory Visit: Payer: Self-pay

## 2023-02-18 ENCOUNTER — Other Ambulatory Visit (HOSPITAL_BASED_OUTPATIENT_CLINIC_OR_DEPARTMENT_OTHER): Payer: Self-pay

## 2023-02-21 ENCOUNTER — Other Ambulatory Visit (HOSPITAL_BASED_OUTPATIENT_CLINIC_OR_DEPARTMENT_OTHER): Payer: Self-pay

## 2023-02-22 ENCOUNTER — Other Ambulatory Visit (HOSPITAL_BASED_OUTPATIENT_CLINIC_OR_DEPARTMENT_OTHER): Payer: Self-pay

## 2023-02-23 ENCOUNTER — Other Ambulatory Visit (HOSPITAL_BASED_OUTPATIENT_CLINIC_OR_DEPARTMENT_OTHER): Payer: Self-pay

## 2023-02-23 ENCOUNTER — Other Ambulatory Visit: Payer: Self-pay

## 2023-03-09 DIAGNOSIS — F4323 Adjustment disorder with mixed anxiety and depressed mood: Secondary | ICD-10-CM | POA: Diagnosis not present

## 2023-03-13 ENCOUNTER — Other Ambulatory Visit (HOSPITAL_BASED_OUTPATIENT_CLINIC_OR_DEPARTMENT_OTHER): Payer: Self-pay

## 2023-03-14 ENCOUNTER — Other Ambulatory Visit (HOSPITAL_BASED_OUTPATIENT_CLINIC_OR_DEPARTMENT_OTHER): Payer: Self-pay

## 2023-03-16 DIAGNOSIS — F4323 Adjustment disorder with mixed anxiety and depressed mood: Secondary | ICD-10-CM | POA: Diagnosis not present

## 2023-03-20 ENCOUNTER — Other Ambulatory Visit: Payer: Self-pay

## 2023-03-20 ENCOUNTER — Other Ambulatory Visit (HOSPITAL_BASED_OUTPATIENT_CLINIC_OR_DEPARTMENT_OTHER): Payer: Self-pay

## 2023-03-20 NOTE — Addendum Note (Signed)
Addended by: Geraldo Docker on: 03/20/2023 10:31 AM   Modules accepted: Orders

## 2023-03-22 ENCOUNTER — Other Ambulatory Visit (HOSPITAL_BASED_OUTPATIENT_CLINIC_OR_DEPARTMENT_OTHER): Payer: Self-pay

## 2023-03-23 ENCOUNTER — Other Ambulatory Visit (HOSPITAL_BASED_OUTPATIENT_CLINIC_OR_DEPARTMENT_OTHER): Payer: Self-pay

## 2023-03-26 ENCOUNTER — Other Ambulatory Visit (HOSPITAL_BASED_OUTPATIENT_CLINIC_OR_DEPARTMENT_OTHER): Payer: Self-pay

## 2023-03-26 MED ORDER — LORAZEPAM 1 MG PO TABS
1.0000 mg | ORAL_TABLET | Freq: Two times a day (BID) | ORAL | 3 refills | Status: DC | PRN
  Filled 2023-03-26: qty 60, 30d supply, fill #0
  Filled 2023-04-17 – 2023-04-21 (×2): qty 60, 30d supply, fill #1
  Filled 2023-05-21: qty 60, 30d supply, fill #2
  Filled 2023-06-20 (×2): qty 60, 30d supply, fill #3

## 2023-04-01 ENCOUNTER — Other Ambulatory Visit (HOSPITAL_BASED_OUTPATIENT_CLINIC_OR_DEPARTMENT_OTHER): Payer: Self-pay

## 2023-04-10 ENCOUNTER — Other Ambulatory Visit (HOSPITAL_BASED_OUTPATIENT_CLINIC_OR_DEPARTMENT_OTHER): Payer: Self-pay

## 2023-04-11 ENCOUNTER — Other Ambulatory Visit (HOSPITAL_BASED_OUTPATIENT_CLINIC_OR_DEPARTMENT_OTHER): Payer: Self-pay

## 2023-04-13 DIAGNOSIS — F4323 Adjustment disorder with mixed anxiety and depressed mood: Secondary | ICD-10-CM | POA: Diagnosis not present

## 2023-04-15 ENCOUNTER — Other Ambulatory Visit (HOSPITAL_BASED_OUTPATIENT_CLINIC_OR_DEPARTMENT_OTHER): Payer: Self-pay

## 2023-04-15 ENCOUNTER — Other Ambulatory Visit: Payer: Self-pay

## 2023-04-15 DIAGNOSIS — F339 Major depressive disorder, recurrent, unspecified: Secondary | ICD-10-CM | POA: Diagnosis not present

## 2023-04-15 DIAGNOSIS — F909 Attention-deficit hyperactivity disorder, unspecified type: Secondary | ICD-10-CM | POA: Diagnosis not present

## 2023-04-15 DIAGNOSIS — F411 Generalized anxiety disorder: Secondary | ICD-10-CM | POA: Diagnosis not present

## 2023-04-15 MED ORDER — PRAMIPEXOLE DIHYDROCHLORIDE 1.5 MG PO TABS
1.5000 mg | ORAL_TABLET | Freq: Every day | ORAL | 3 refills | Status: DC
  Filled 2023-05-09 (×2): qty 30, 30d supply, fill #0

## 2023-04-15 MED ORDER — DULOXETINE HCL 30 MG PO CPEP: 90.0000 mg | ORAL_CAPSULE | Freq: Every day | ORAL | 3 refills | Status: DC

## 2023-04-15 MED ORDER — TRAZODONE HCL 100 MG PO TABS
100.0000 mg | ORAL_TABLET | Freq: Every evening | ORAL | 2 refills | Status: DC | PRN
  Filled 2023-04-21: qty 60, fill #0
  Filled 2023-05-05: qty 60, 30d supply, fill #0
  Filled 2023-09-04 – 2023-09-05 (×2): qty 60, 30d supply, fill #1

## 2023-04-15 MED ORDER — AMPHETAMINE-DEXTROAMPHETAMINE 20 MG PO TABS
20.0000 mg | ORAL_TABLET | Freq: Two times a day (BID) | ORAL | 0 refills | Status: DC
  Filled 2023-05-19: qty 60, 30d supply, fill #0

## 2023-04-15 MED ORDER — AMPHETAMINE-DEXTROAMPHETAMINE 20 MG PO TABS
20.0000 mg | ORAL_TABLET | Freq: Two times a day (BID) | ORAL | 0 refills | Status: DC
  Filled 2023-04-21: qty 60, 30d supply, fill #0

## 2023-04-15 MED ORDER — BUSPIRONE HCL 30 MG PO TABS
30.0000 mg | ORAL_TABLET | Freq: Two times a day (BID) | ORAL | 3 refills | Status: DC
  Filled 2023-04-15: qty 60, 30d supply, fill #0

## 2023-04-15 MED ORDER — AMPHETAMINE-DEXTROAMPHETAMINE 20 MG PO TABS
20.0000 mg | ORAL_TABLET | Freq: Two times a day (BID) | ORAL | 0 refills | Status: DC
  Filled 2023-04-17 – 2023-06-19 (×3): qty 60, 30d supply, fill #0

## 2023-04-16 ENCOUNTER — Ambulatory Visit (INDEPENDENT_AMBULATORY_CARE_PROVIDER_SITE_OTHER): Payer: Commercial Managed Care - PPO

## 2023-04-16 ENCOUNTER — Other Ambulatory Visit (HOSPITAL_BASED_OUTPATIENT_CLINIC_OR_DEPARTMENT_OTHER): Payer: Commercial Managed Care - PPO

## 2023-04-16 DIAGNOSIS — R0602 Shortness of breath: Secondary | ICD-10-CM | POA: Diagnosis not present

## 2023-04-16 DIAGNOSIS — I5030 Unspecified diastolic (congestive) heart failure: Secondary | ICD-10-CM | POA: Diagnosis not present

## 2023-04-16 LAB — ECHOCARDIOGRAM COMPLETE
Area-P 1/2: 4.21 cm2
S' Lateral: 2.31 cm

## 2023-04-17 ENCOUNTER — Other Ambulatory Visit (HOSPITAL_BASED_OUTPATIENT_CLINIC_OR_DEPARTMENT_OTHER): Payer: Self-pay

## 2023-04-21 ENCOUNTER — Other Ambulatory Visit (HOSPITAL_BASED_OUTPATIENT_CLINIC_OR_DEPARTMENT_OTHER): Payer: Self-pay

## 2023-04-21 ENCOUNTER — Other Ambulatory Visit: Payer: Self-pay

## 2023-04-21 ENCOUNTER — Other Ambulatory Visit: Payer: Self-pay | Admitting: Family

## 2023-04-21 MED ORDER — FAMOTIDINE 40 MG PO TABS
20.0000 mg | ORAL_TABLET | Freq: Two times a day (BID) | ORAL | 5 refills | Status: DC
  Filled 2023-04-21: qty 60, 60d supply, fill #0
  Filled 2023-06-29: qty 60, 60d supply, fill #1
  Filled 2023-09-01 – 2023-09-04 (×2): qty 60, 60d supply, fill #2
  Filled 2023-10-29: qty 60, 60d supply, fill #3
  Filled 2023-12-24: qty 60, 60d supply, fill #4
  Filled 2024-01-22 – 2024-03-05 (×3): qty 60, 60d supply, fill #5

## 2023-04-23 ENCOUNTER — Other Ambulatory Visit: Payer: Self-pay

## 2023-04-27 ENCOUNTER — Encounter: Payer: Self-pay | Admitting: Family

## 2023-04-27 DIAGNOSIS — I10 Essential (primary) hypertension: Secondary | ICD-10-CM

## 2023-05-06 ENCOUNTER — Other Ambulatory Visit (HOSPITAL_BASED_OUTPATIENT_CLINIC_OR_DEPARTMENT_OTHER): Payer: Self-pay

## 2023-05-08 ENCOUNTER — Ambulatory Visit: Payer: Commercial Managed Care - PPO | Admitting: Podiatrist

## 2023-05-08 ENCOUNTER — Encounter: Payer: Self-pay | Admitting: Podiatrist

## 2023-05-08 DIAGNOSIS — M722 Plantar fascial fibromatosis: Secondary | ICD-10-CM

## 2023-05-08 NOTE — Progress Notes (Unsigned)
Chief Complaint  Patient presents with   Plantar Fasciitis    "My heels are acting up again today."     HPI: Patient is 47 y.o. female who presents today for shots both heels.  Return prn   No Known Allergies  Review of systems is negative except as noted in the HPI.  Denies nausea/ vomiting/ fevers/ chills or night sweats.   Denies difficulty breathing, denies calf pain or tenderness  Physical Exam  Patient is awake, alert, and oriented x 3.  In no acute distress.    Vascular status is intact with palpable pedal pulses DP and PT bilateral and capillary refill time less than 3 seconds bilateral.  No edema or erythema noted.   Neurological exam reveals epicritic and protective sensation grossly intact bilateral.   Dermatological exam reveals skin is supple and dry to bilateral feet.  No open lesions present.    Musculoskeletal exam: Musculature intact with dorsiflexion, plantarflexion, inversion, eversion. Ankle and First MPJ joint range of motion normal.   ***  Assessment: ***  Plan: ***

## 2023-05-09 ENCOUNTER — Other Ambulatory Visit (HOSPITAL_BASED_OUTPATIENT_CLINIC_OR_DEPARTMENT_OTHER): Payer: Self-pay

## 2023-05-19 ENCOUNTER — Other Ambulatory Visit: Payer: Self-pay

## 2023-05-19 ENCOUNTER — Other Ambulatory Visit (HOSPITAL_BASED_OUTPATIENT_CLINIC_OR_DEPARTMENT_OTHER): Payer: Self-pay

## 2023-05-20 ENCOUNTER — Encounter: Payer: Commercial Managed Care - PPO | Attending: Family | Admitting: Skilled Nursing Facility1

## 2023-05-20 ENCOUNTER — Encounter: Payer: Self-pay | Admitting: Skilled Nursing Facility1

## 2023-05-20 VITALS — Ht 67.0 in | Wt 224.3 lb

## 2023-05-20 DIAGNOSIS — E631 Imbalance of constituents of food intake: Secondary | ICD-10-CM | POA: Insufficient documentation

## 2023-05-20 NOTE — Progress Notes (Signed)
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 Pt states she has ADHD and forgets to preplan her meals.   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 Pt states she did get a marginal ulcer.  Pt states due to stress eating she has gained weight. Pt states she sees her therapist once a month.  Pt states she has acid reflux very bad.  Pt states she needed a bowl resection.  Pt states she has been having shortness of breath for a few years.  Pt states she usually waits too long to eat and ends up starving.  Pt states she has a 34 and 49 year old at home.    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 8000-10000 steps working 3-4 days a week, on days off hike or walks, riding bikes   24-Hr Dietary Recall First Meal: coffee + cream, cereal with whole milk or bagel + butter or leftovers: spaggetti or pizza Snack: crackers and peanut butter and cheese stick Second Meal: skipped Snack 3-4pm: grilled cheese and fries or panera soup and  bread Third Meal 5-5:30: fast food Snack: oreos or cake  Snack: 4 cheese sticks, cherries eats in the middle of the night Beverages: 40-36oz diet dew, coffee, hibiscus green tea, maybe 20 ounces    NUTRITION INTERVENTION  Nutrition education (E-1) on the following topics:  Creation of balanced and diverse meals to increase the intake of nutrient-rich foods that provide essential vitamins, minerals, fiber, and phytonutrients Variety of Fruits and Vegetables: Aim for a colorful array of fruits and vegetables to ensure a wide range of nutrients. Include a mix of leafy greens, berries, citrus fruits, cruciferous vegetables, and more. Whole Grains: Choose whole grains over refined grains. Examples include brown rice, quinoa, oats, whole wheat, and barley. Lean Proteins: Include lean sources of protein, such as poultry, fish, tofu, legumes, beans, lentils, and low-fat dairy products. Limit red and processed meats. Healthy Fats: Incorporate sources of healthy fats, including avocados, nuts, seeds, and olive oil. Limit saturated and trans fats found in fried and processed foods. Dairy or Dairy Alternatives: Choose low-fat or fat-free dairy products, or plant-based alternatives like almond or soy milk. Portion Control: Be mindful of portion sizes to avoid overeating. Pay attention to  hunger and satisfaction cues. Limit Added Sugars: Minimize the consumption of sugary beverages, snacks, and desserts. Check food labels for added sugars and opt for natural sources of sweetness such as whole fruits. Hydration: Drink plenty of water throughout the day. Limit sugary drinks and excessive caffeine intake. Moderate Sodium Intake: Reduce the consumption of high-sodium foods. Use herbs and spices for flavor instead of excessive salt. Meal Planning and Preparation: Plan and prepare meals ahead of time to make healthier choices more convenient. Include a mix of food groups in each meal. Limit Processed  Foods: Minimize the intake of highly processed and packaged foods that are often high in added sugars, salt, and unhealthy fats. Regular Physical Activity: Combine a healthy diet with regular physical activity for overall well-being. Aim for at least 150 minutes of moderate-intensity aerobic exercise per week, along with strength training. Moderation and Balance: Enjoy treats and indulgent foods in moderation, emphasizing balance rather than strict restriction.  Handouts Provided Include  Mindful meals Should I eat  Learning Style & Readiness for Change Teaching method utilized: Visual & Auditory  Demonstrated degree of understanding via: Teach Back  Barriers to learning/adherence to lifestyle change: stress eating   Goals Established by Pt Look into PT therapy for pelvic floor  Get a glucometer and test every day fasting and any time you feel weird and when waking up hungry in the middle of the night  Aim for 40 ounces of fluid with no caffeine, crystal light is fine but be sure to read for caffeine  Work on relationship with food Eat lunch daily and have a non starchy vegetable with that lunch   MONITORING & EVALUATION Dietary intake, weekly physical activity  Next Steps  Patient is to follow up for next appt.

## 2023-05-21 ENCOUNTER — Other Ambulatory Visit: Payer: Self-pay

## 2023-05-25 DIAGNOSIS — F4323 Adjustment disorder with mixed anxiety and depressed mood: Secondary | ICD-10-CM | POA: Diagnosis not present

## 2023-06-06 ENCOUNTER — Other Ambulatory Visit: Payer: Self-pay | Admitting: Family

## 2023-06-06 ENCOUNTER — Other Ambulatory Visit (HOSPITAL_BASED_OUTPATIENT_CLINIC_OR_DEPARTMENT_OTHER): Payer: Self-pay

## 2023-06-08 ENCOUNTER — Other Ambulatory Visit: Payer: Self-pay

## 2023-06-08 ENCOUNTER — Other Ambulatory Visit (HOSPITAL_BASED_OUTPATIENT_CLINIC_OR_DEPARTMENT_OTHER): Payer: Self-pay

## 2023-06-08 DIAGNOSIS — F4323 Adjustment disorder with mixed anxiety and depressed mood: Secondary | ICD-10-CM | POA: Diagnosis not present

## 2023-06-08 MED ORDER — RIZATRIPTAN BENZOATE 10 MG PO TABS
10.0000 mg | ORAL_TABLET | ORAL | 3 refills | Status: DC | PRN
  Filled 2023-06-08: qty 10, 30d supply, fill #0
  Filled 2023-08-05: qty 10, 30d supply, fill #1
  Filled 2023-10-29: qty 10, 30d supply, fill #2
  Filled 2024-01-31: qty 10, 30d supply, fill #3

## 2023-06-19 ENCOUNTER — Other Ambulatory Visit (HOSPITAL_BASED_OUTPATIENT_CLINIC_OR_DEPARTMENT_OTHER): Payer: Self-pay

## 2023-06-19 ENCOUNTER — Other Ambulatory Visit: Payer: Self-pay

## 2023-06-20 ENCOUNTER — Other Ambulatory Visit (HOSPITAL_BASED_OUTPATIENT_CLINIC_OR_DEPARTMENT_OTHER): Payer: Self-pay

## 2023-06-22 DIAGNOSIS — F4323 Adjustment disorder with mixed anxiety and depressed mood: Secondary | ICD-10-CM | POA: Diagnosis not present

## 2023-06-26 ENCOUNTER — Telehealth: Payer: Commercial Managed Care - PPO | Admitting: Physician Assistant

## 2023-06-26 DIAGNOSIS — M549 Dorsalgia, unspecified: Secondary | ICD-10-CM

## 2023-06-26 DIAGNOSIS — K259 Gastric ulcer, unspecified as acute or chronic, without hemorrhage or perforation: Secondary | ICD-10-CM | POA: Insufficient documentation

## 2023-06-26 MED ORDER — METHOCARBAMOL 500 MG PO TABS
500.0000 mg | ORAL_TABLET | Freq: Four times a day (QID) | ORAL | 0 refills | Status: DC | PRN
  Filled 2023-06-26: qty 30, 8d supply, fill #0

## 2023-06-26 NOTE — Progress Notes (Signed)
We are sorry that you are not feeling well.  Here is how we plan to help!  Based on what you have shared with me it looks like you mostly have acute back pain.  Acute back pain is defined as musculoskeletal pain that can resolve in 1-3 weeks with conservative treatment.  I have prescribed Methocarbamol 500mg  Take 1 tablet every 6 hours as needed. Please keep in mind that muscle relaxer's can cause fatigue and should not be taken while at work or driving.  Back pain is very common.  The pain often gets better over time.  The cause of back pain is usually not dangerous.  Most people can learn to manage their back pain on their own.  Home Care Stay active.  Start with short walks on flat ground if you can.  Try to walk farther each day. Do not sit, drive or stand in one place for more than 30 minutes.  Do not stay in bed. Do not avoid exercise or work.  Activity can help your back heal faster. Be careful when you bend or lift an object.  Bend at your knees, keep the object close to you, and do not twist. Sleep on a firm mattress.  Lie on your side, and bend your knees.  If you lie on your back, put a pillow under your knees. Only take medicines as told by your doctor. Put ice on the injured area. Put ice in a plastic bag Place a towel between your skin and the bag Leave the ice on for 15-20 minutes, 3-4 times a day for the first 2-3 days. 210 After that, you can switch between ice and heat packs. Ask your doctor about back exercises or massage. Avoid feeling anxious or stressed.  Find good ways to deal with stress, such as exercise.  Get Help Right Way If: Your pain does not go away with rest or medicine. Your pain does not go away in 1 week. You have new problems. You do not feel well. The pain spreads into your legs. You cannot control when you poop (bowel movement) or pee (urinate) You feel sick to your stomach (nauseous) or throw up (vomit) You have belly (abdominal) pain. You feel  like you may pass out (faint). If you develop a fever.  Make Sure you: Understand these instructions. Will watch your condition Will get help right away if you are not doing well or get worse.  Your e-visit answers were reviewed by a board certified advanced clinical practitioner to complete your personal care plan.  Depending on the condition, your plan could have included both over the counter or prescription medications.  If there is a problem please reply  once you have received a response from your provider.  Your safety is important to [place].  If you have drug allergies check your prescription carefully.    You can use MyChart to ask questions about today's visit, request a non-urgent call back, or ask for a work or school excuse for 24 hours related to this e-Visit. If it has been greater than 24 hours you will need to follow up with your provider, or enter a new e-Visit to address those concerns.  You will get an e-mail in the next two days asking about your experience.  I hope that your e-visit has been valuable and will speed your recovery. Thank you for using e-visits.  I have spent 5 minutes in review of e-visit questionnaire, review and updating patient chart, medical decision  making and response to patient.   Margaretann Loveless, PA-C

## 2023-06-27 ENCOUNTER — Other Ambulatory Visit (HOSPITAL_BASED_OUTPATIENT_CLINIC_OR_DEPARTMENT_OTHER): Payer: Self-pay

## 2023-07-08 ENCOUNTER — Other Ambulatory Visit (HOSPITAL_BASED_OUTPATIENT_CLINIC_OR_DEPARTMENT_OTHER): Payer: Self-pay

## 2023-07-08 DIAGNOSIS — F909 Attention-deficit hyperactivity disorder, unspecified type: Secondary | ICD-10-CM | POA: Diagnosis not present

## 2023-07-08 DIAGNOSIS — F339 Major depressive disorder, recurrent, unspecified: Secondary | ICD-10-CM | POA: Diagnosis not present

## 2023-07-08 DIAGNOSIS — F411 Generalized anxiety disorder: Secondary | ICD-10-CM | POA: Diagnosis not present

## 2023-07-08 MED ORDER — BUSPIRONE HCL 30 MG PO TABS
30.0000 mg | ORAL_TABLET | Freq: Two times a day (BID) | ORAL | 3 refills | Status: DC
  Filled 2023-07-08: qty 60, 30d supply, fill #0

## 2023-07-08 MED ORDER — LORAZEPAM 1 MG PO TABS
1.0000 mg | ORAL_TABLET | Freq: Two times a day (BID) | ORAL | 3 refills | Status: DC | PRN
  Filled 2023-07-16 – 2023-07-19 (×2): qty 60, 30d supply, fill #0
  Filled 2023-08-20: qty 60, 30d supply, fill #1
  Filled 2023-09-22: qty 60, 30d supply, fill #2
  Filled 2023-10-16: qty 60, 30d supply, fill #3

## 2023-07-08 MED ORDER — PRAMIPEXOLE DIHYDROCHLORIDE 1.5 MG PO TABS
1.5000 mg | ORAL_TABLET | Freq: Every day | ORAL | 3 refills | Status: DC
  Filled 2023-07-08 – 2023-09-04 (×2): qty 30, 30d supply, fill #0

## 2023-07-08 MED ORDER — TRAZODONE HCL 100 MG PO TABS
100.0000 mg | ORAL_TABLET | Freq: Every evening | ORAL | 2 refills | Status: DC | PRN
  Filled 2023-07-08: qty 60, 30d supply, fill #0

## 2023-07-08 MED ORDER — AMPHETAMINE-DEXTROAMPHETAMINE 20 MG PO TABS
20.0000 mg | ORAL_TABLET | Freq: Two times a day (BID) | ORAL | 0 refills | Status: DC
  Filled 2023-07-08 – 2023-09-20 (×3): qty 60, 30d supply, fill #0

## 2023-07-08 MED ORDER — AMPHETAMINE-DEXTROAMPHETAMINE 20 MG PO TABS
20.0000 mg | ORAL_TABLET | Freq: Two times a day (BID) | ORAL | 0 refills | Status: DC
  Filled 2023-07-20: qty 60, 30d supply, fill #0

## 2023-07-08 MED ORDER — DULOXETINE HCL 30 MG PO CPEP
90.0000 mg | ORAL_CAPSULE | Freq: Every day | ORAL | 3 refills | Status: DC
  Filled 2023-07-08 – 2023-09-04 (×2): qty 90, 30d supply, fill #0

## 2023-07-08 MED ORDER — AMPHETAMINE-DEXTROAMPHETAMINE 20 MG PO TABS
20.0000 mg | ORAL_TABLET | Freq: Two times a day (BID) | ORAL | 0 refills | Status: DC
  Filled 2023-07-19 – 2023-08-20 (×2): qty 60, 30d supply, fill #0

## 2023-07-10 ENCOUNTER — Ambulatory Visit: Payer: Commercial Managed Care - PPO | Admitting: Obstetrics and Gynecology

## 2023-07-17 ENCOUNTER — Other Ambulatory Visit (HOSPITAL_BASED_OUTPATIENT_CLINIC_OR_DEPARTMENT_OTHER): Payer: Self-pay

## 2023-07-20 ENCOUNTER — Other Ambulatory Visit: Payer: Self-pay

## 2023-07-20 ENCOUNTER — Other Ambulatory Visit (HOSPITAL_BASED_OUTPATIENT_CLINIC_OR_DEPARTMENT_OTHER): Payer: Self-pay

## 2023-07-21 ENCOUNTER — Ambulatory Visit: Payer: Commercial Managed Care - PPO | Admitting: Skilled Nursing Facility1

## 2023-07-31 ENCOUNTER — Ambulatory Visit: Payer: Commercial Managed Care - PPO | Admitting: Family

## 2023-08-05 ENCOUNTER — Encounter: Payer: Self-pay | Admitting: Podiatry

## 2023-08-05 ENCOUNTER — Encounter: Payer: Self-pay | Admitting: Obstetrics and Gynecology

## 2023-08-05 ENCOUNTER — Ambulatory Visit: Payer: Commercial Managed Care - PPO | Admitting: Podiatry

## 2023-08-05 ENCOUNTER — Ambulatory Visit (INDEPENDENT_AMBULATORY_CARE_PROVIDER_SITE_OTHER): Payer: Commercial Managed Care - PPO | Admitting: Obstetrics and Gynecology

## 2023-08-05 VITALS — BP 115/81 | HR 103 | Ht 66.75 in | Wt 233.0 lb

## 2023-08-05 DIAGNOSIS — N812 Incomplete uterovaginal prolapse: Secondary | ICD-10-CM | POA: Diagnosis not present

## 2023-08-05 DIAGNOSIS — N3281 Overactive bladder: Secondary | ICD-10-CM

## 2023-08-05 DIAGNOSIS — M722 Plantar fascial fibromatosis: Secondary | ICD-10-CM

## 2023-08-05 DIAGNOSIS — N393 Stress incontinence (female) (male): Secondary | ICD-10-CM

## 2023-08-05 MED ORDER — TRIAMCINOLONE ACETONIDE 10 MG/ML IJ SUSP
10.0000 mg | Freq: Once | INTRAMUSCULAR | Status: AC
Start: 2023-08-05 — End: 2023-08-05
  Administered 2023-08-05: 10 mg via INTRA_ARTICULAR

## 2023-08-05 NOTE — Progress Notes (Signed)
Subjective:   Patient ID: [name], female   DOB: 47 y.o.   MRN: [id number]   HPI Patient states has had reoccurrence of pain in the plantar heel left over right and states that she did not get as long relief and feels like it needs to be stretched better   ROS      Objective:  Physical Exam  Neurovascular status intact exquisite discomfort medial fascial band left over right plantar fascia with inflammation noted and tightness of the plantar fascia bilateral     Assessment:  Acute plantar fasciitis left over right with treatment by Dr. Brynda Rim around 3 months ago     Plan:  H&P reviewed sterile prep injected the plantar fascia bilateral 3 mg Kenalog 5 mg Xylocaine dispensed night splint with all instructions on usage along with aggressive ice therapy and reappoint to recheck
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
## 2023-08-08 ENCOUNTER — Other Ambulatory Visit (HOSPITAL_BASED_OUTPATIENT_CLINIC_OR_DEPARTMENT_OTHER): Payer: Self-pay

## 2023-08-20 ENCOUNTER — Other Ambulatory Visit (HOSPITAL_BASED_OUTPATIENT_CLINIC_OR_DEPARTMENT_OTHER): Payer: Self-pay

## 2023-08-20 ENCOUNTER — Other Ambulatory Visit: Payer: Self-pay

## 2023-09-04 ENCOUNTER — Telehealth: Payer: Self-pay | Admitting: Internal Medicine

## 2023-09-04 ENCOUNTER — Other Ambulatory Visit (HOSPITAL_BASED_OUTPATIENT_CLINIC_OR_DEPARTMENT_OTHER): Payer: Self-pay

## 2023-09-04 NOTE — Telephone Encounter (Signed)
Inbound call from patient wishing to be seen with Dr. Leonides Schanz. Has history with [facility]. Requesting transfer of care due to now being with [facility].

## 2023-09-05 ENCOUNTER — Other Ambulatory Visit (HOSPITAL_BASED_OUTPATIENT_CLINIC_OR_DEPARTMENT_OTHER): Payer: Self-pay

## 2023-09-08 ENCOUNTER — Other Ambulatory Visit: Payer: Self-pay

## 2023-09-08 ENCOUNTER — Encounter: Payer: Commercial Managed Care - PPO | Admitting: Obstetrics and Gynecology

## 2023-09-10 ENCOUNTER — Ambulatory Visit: Payer: Commercial Managed Care - PPO | Admitting: Physician Assistant

## 2023-09-14 DIAGNOSIS — F4323 Adjustment disorder with mixed anxiety and depressed mood: Secondary | ICD-10-CM | POA: Diagnosis not present

## 2023-09-15 ENCOUNTER — Ambulatory Visit: Payer: Commercial Managed Care - PPO | Admitting: Obstetrics and Gynecology

## 2023-09-17 ENCOUNTER — Ambulatory Visit: Payer: Commercial Managed Care - PPO | Admitting: Physician Assistant

## 2023-09-17 ENCOUNTER — Other Ambulatory Visit (HOSPITAL_BASED_OUTPATIENT_CLINIC_OR_DEPARTMENT_OTHER): Payer: Self-pay

## 2023-09-21 ENCOUNTER — Other Ambulatory Visit (HOSPITAL_BASED_OUTPATIENT_CLINIC_OR_DEPARTMENT_OTHER): Payer: Self-pay

## 2023-09-22 ENCOUNTER — Other Ambulatory Visit: Payer: Self-pay

## 2023-09-30 ENCOUNTER — Encounter: Payer: Self-pay | Admitting: Obstetrics and Gynecology

## 2023-09-30 ENCOUNTER — Ambulatory Visit (INDEPENDENT_AMBULATORY_CARE_PROVIDER_SITE_OTHER): Payer: Commercial Managed Care - PPO | Admitting: Obstetrics and Gynecology

## 2023-09-30 ENCOUNTER — Other Ambulatory Visit (HOSPITAL_BASED_OUTPATIENT_CLINIC_OR_DEPARTMENT_OTHER): Payer: Self-pay

## 2023-09-30 VITALS — BP 99/68 | HR 104

## 2023-09-30 DIAGNOSIS — F840 Autistic disorder: Secondary | ICD-10-CM | POA: Diagnosis not present

## 2023-09-30 DIAGNOSIS — N393 Stress incontinence (female) (male): Secondary | ICD-10-CM | POA: Diagnosis not present

## 2023-09-30 DIAGNOSIS — F411 Generalized anxiety disorder: Secondary | ICD-10-CM | POA: Diagnosis not present

## 2023-09-30 DIAGNOSIS — N3281 Overactive bladder: Secondary | ICD-10-CM | POA: Diagnosis not present

## 2023-09-30 DIAGNOSIS — F909 Attention-deficit hyperactivity disorder, unspecified type: Secondary | ICD-10-CM | POA: Diagnosis not present

## 2023-09-30 DIAGNOSIS — R350 Frequency of micturition: Secondary | ICD-10-CM

## 2023-09-30 DIAGNOSIS — F339 Major depressive disorder, recurrent, unspecified: Secondary | ICD-10-CM | POA: Diagnosis not present

## 2023-09-30 LAB — POCT URINALYSIS DIPSTICK
Bilirubin, UA: NEGATIVE
Blood, UA: NEGATIVE
Glucose, UA: NEGATIVE
Ketones, UA: NEGATIVE
Leukocytes, UA: NEGATIVE
Nitrite, UA: NEGATIVE
Protein, UA: NEGATIVE
Spec Grav, UA: 1.025 (ref 1.010–1.025)
Urobilinogen, UA: 0.2 U/dL
pH, UA: 6 (ref 5.0–8.0)

## 2023-09-30 MED ORDER — BUSPIRONE HCL 30 MG PO TABS
30.0000 mg | ORAL_TABLET | Freq: Two times a day (BID) | ORAL | 3 refills | Status: DC
  Filled 2023-09-30: qty 60, 30d supply, fill #0

## 2023-09-30 MED ORDER — TRAZODONE HCL 100 MG PO TABS
100.0000 mg | ORAL_TABLET | Freq: Every evening | ORAL | 2 refills | Status: DC | PRN
  Filled 2023-09-30: qty 60, 30d supply, fill #0

## 2023-09-30 MED ORDER — AMPHETAMINE-DEXTROAMPHETAMINE 20 MG PO TABS: 20.0000 mg | ORAL_TABLET | Freq: Two times a day (BID) | ORAL | 0 refills | Status: DC

## 2023-09-30 MED ORDER — PRAMIPEXOLE DIHYDROCHLORIDE 1.5 MG PO TABS
1.5000 mg | ORAL_TABLET | Freq: Every day | ORAL | 3 refills | Status: DC
  Filled 2023-09-30 – 2023-10-02 (×2): qty 30, 30d supply, fill #0
  Filled 2024-01-22: qty 30, 30d supply, fill #1
  Filled 2024-02-21: qty 30, 30d supply, fill #2
  Filled 2024-06-24 – 2024-07-24 (×2): qty 30, 30d supply, fill #3

## 2023-09-30 MED ORDER — DULOXETINE HCL 30 MG PO CPEP
90.0000 mg | ORAL_CAPSULE | Freq: Every day | ORAL | 3 refills | Status: DC
  Filled 2023-09-30: qty 90, 30d supply, fill #0

## 2023-09-30 MED ORDER — AMPHETAMINE-DEXTROAMPHETAMINE 20 MG PO TABS
20.0000 mg | ORAL_TABLET | Freq: Two times a day (BID) | ORAL | 0 refills | Status: DC
  Filled 2023-09-30: qty 60, 30d supply, fill #0

## 2023-09-30 NOTE — Progress Notes (Signed)
[facility] Urodynamics Procedure  Referring Physician: [name], NP Date of Procedure: 09/30/2023  [name] is a 47 y.o. female who presents for urodynamic evaluation. Indication(s) for study: SUI  Vital Signs: BP 99/68   Pulse (!) 104   Laboratory Results: A clean catch urine specimen revealed:  POC urine:  Lab Results  Component Value Date   COLORU yellow 09/30/2023   CLARITYU clear 09/30/2023   GLUCOSEUR Negative 09/30/2023   BILIRUBINUR negative 09/30/2023   KETONESU negative 09/30/2023   SPECGRAV 1.025 09/30/2023   RBCUR negative 09/30/2023   PHUR 6.0 09/30/2023   PROTEINUR Negative 09/30/2023   UROBILINOGEN 0.2 09/30/2023   LEUKOCYTESUR Negative 09/30/2023      Voiding Diary: Deferred  Procedure Timeout:  The correct patient was verified and the correct procedure was verified. The patient was in the correct position and safety precautions were reviewed based on at the patient's history.  Urodynamic Procedure A 40F dual lumen urodynamics catheter was placed under sterile conditions into the patient's bladder. A 40F catheter was placed into the rectum in order to measure abdominal pressure. EMG patches were placed in the appropriate position.  All connections were confirmed and calibrations/adjusted made. Saline was instilled into the bladder through the dual lumen catheters.  Cough/valsalva pressures were measured periodically during filling.  Patient was allowed to void.  The bladder was then emptied of its residual.  UROFLOW: Revealed a Qmax of 24 mL/sec.  She voided 282 mL and had a residual of 50 mL.  It was a normal pattern and represented normal habits.  CMG: This was performed with sterile water in the sitting position at a fill rate of 20-30 mL/min.    First sensation of fullness was 199 mLs,  First urge was 267 mLs,  Strong urge was 352 mLs and  Capacity was 450 mLs  Stress incontinence was demonstrated Highest negative  Barrier CLPP was 146 cmH20 at 267 ml. Highest positive Barrier VLPP was 107 cmH20 at 267 ml.  Detrusor function was overactive, with phasic contractions seen.  The first occurred at 267 mL to 5.3 cm of water and was associated with urge.  Compliance:  Low. End fill detrusor pressure was 48 cmH20.  Calculated compliance was 9.4 [id number]  UPP: MUCP with barrier reduction was 96 cm of water.    MICTURITION STUDY: Voiding was performed with reduction using scopettes in the sitting position.  Pdet at Qmax was 18.6 cm of water.  Qmax was 33.6 mL/sec.  It was a prolonged normal pattern.  She voided 410 mL and had a residual of 40 mL.  It was a volitional void, sustained detrusor contraction was present and abdominal straining was present  EMG: This was performed with patches.  She had voluntary contractions, recruitment with fill was not present and urethral sphincter was not relaxed with void.  The details of the procedure with the study tracings have been scanned into EPIC.   Urodynamic Impression:  1. Sensation was reduced; capacity was normal 2. Stress Incontinence was demonstrated at normal pressures; 3. Detrusor Overactivity was demonstrated without leakage. 4. Emptying was dysfunctional with a normal PVR, a sustained detrusor contraction present,  abdominal straining present, dyssynergic urethral sphincter activity on EMG.  Plan: - The patient will follow up  to discuss the findings and treatment options.

## 2023-10-02 ENCOUNTER — Other Ambulatory Visit: Payer: Self-pay

## 2023-10-02 ENCOUNTER — Other Ambulatory Visit (HOSPITAL_BASED_OUTPATIENT_CLINIC_OR_DEPARTMENT_OTHER): Payer: Self-pay

## 2023-10-05 DIAGNOSIS — F840 Autistic disorder: Secondary | ICD-10-CM | POA: Diagnosis not present

## 2023-10-16 ENCOUNTER — Other Ambulatory Visit (HOSPITAL_BASED_OUTPATIENT_CLINIC_OR_DEPARTMENT_OTHER): Payer: Self-pay

## 2023-10-16 ENCOUNTER — Ambulatory Visit (INDEPENDENT_AMBULATORY_CARE_PROVIDER_SITE_OTHER): Payer: Commercial Managed Care - PPO | Admitting: Obstetrics and Gynecology

## 2023-10-16 ENCOUNTER — Other Ambulatory Visit: Payer: Self-pay

## 2023-10-16 ENCOUNTER — Encounter: Payer: Self-pay | Admitting: Obstetrics and Gynecology

## 2023-10-16 VITALS — BP 120/73 | HR 105 | Wt 228.0 lb

## 2023-10-16 DIAGNOSIS — F909 Attention-deficit hyperactivity disorder, unspecified type: Secondary | ICD-10-CM | POA: Diagnosis not present

## 2023-10-16 DIAGNOSIS — F339 Major depressive disorder, recurrent, unspecified: Secondary | ICD-10-CM | POA: Diagnosis not present

## 2023-10-16 DIAGNOSIS — F411 Generalized anxiety disorder: Secondary | ICD-10-CM | POA: Diagnosis not present

## 2023-10-16 DIAGNOSIS — N812 Incomplete uterovaginal prolapse: Secondary | ICD-10-CM

## 2023-10-16 DIAGNOSIS — Z01818 Encounter for other preprocedural examination: Secondary | ICD-10-CM

## 2023-10-16 MED ORDER — METHOCARBAMOL 750 MG PO TABS
750.0000 mg | ORAL_TABLET | Freq: Four times a day (QID) | ORAL | 0 refills | Status: DC | PRN
  Filled 2023-10-16: qty 30, 8d supply, fill #0

## 2023-10-16 MED ORDER — OXYCODONE HCL 5 MG PO TABS
5.0000 mg | ORAL_TABLET | ORAL | 0 refills | Status: DC | PRN
  Filled 2023-10-16: qty 15, 3d supply, fill #0

## 2023-10-16 MED ORDER — POLYETHYLENE GLYCOL 3350 17 GM/SCOOP PO POWD
17.0000 g | Freq: Every day | ORAL | 0 refills | Status: DC
  Filled 2023-10-16: qty 238, 14d supply, fill #0

## 2023-10-16 MED ORDER — LORAZEPAM 1 MG PO TABS
1.0000 mg | ORAL_TABLET | Freq: Two times a day (BID) | ORAL | 3 refills | Status: DC | PRN
  Filled 2023-10-16 – 2023-10-20 (×3): qty 60, 30d supply, fill #0
  Filled 2023-11-16: qty 60, 30d supply, fill #1
  Filled 2023-12-15: qty 60, 30d supply, fill #2
  Filled 2024-01-13: qty 60, 30d supply, fill #3

## 2023-10-16 MED ORDER — ACETAMINOPHEN 500 MG PO TABS
500.0000 mg | ORAL_TABLET | Freq: Four times a day (QID) | ORAL | 0 refills | Status: DC | PRN
  Filled 2023-10-16: qty 30, 8d supply, fill #0

## 2023-10-16 NOTE — Progress Notes (Signed)
[facility] Follow up visit  Subjective Chief Complaint: [name] presents for a preoperative encounter.   History of Present Illness: [name] is a 47 y.o. female who presents for preoperative visit.  She is scheduled to undergo Robotic assisted total laparoscopic hysterectomy with bilateral salpingectomy, sacrocolpopexy, perineoplasty, midurethral sling, cystoscopy on 08/05/23.  Her symptoms include vaginal bulge and incontinence, and she was was found to have Stage II anterior, Stage II posterior, Stage I apical prolapse.   Urodynamic Impression:  1. Sensation was reduced; capacity was normal 2. Stress Incontinence was demonstrated at normal pressures; 3. Detrusor Overactivity was demonstrated without leakage. 4. Emptying was dysfunctional with a normal PVR, a sustained detrusor contraction present,  abdominal straining present, dyssynergic urethral sphincter activity on EMG.  Past Medical History:  Diagnosis Date   ADHD (attention deficit hyperactivity disorder)    Anemia    Depression    GERD (gastroesophageal reflux disease)    Headache(784.0)    Hypertension    IBS (irritable bowel syndrome)    Incisional hernia    SVD (spontaneous vaginal delivery) 10/17/2011   Ulcer      Past Surgical History:  Procedure Laterality Date   BOWEL RESECTION  06/05/2022   due to intususception   CHOLECYSTECTOMY  11/24/2004   ESOPHAGOGASTRODUODENOSCOPY  11/28/2011   Procedure: ESOPHAGOGASTRODUODENOSCOPY (EGD);  Surgeon: Theda Belfast, MD;  Location: [facility];  Service: Endoscopy;  Laterality: N/A;   lap repair gastrojejunal anastomotic perforation  02/21/2011   Dr Andrey Campanile   LAPAROSCOPIC GASTROTOMY W/ REPAIR OF ULCER  10/23/2009   ROUX-EN-Y GASTRIC BYPASS  11/25/2003   SMALL BOWEL REPAIR  06/05/2022   VENTRAL HERNIA REPAIR  12/22/2011   Procedure: HERNIA REPAIR VENTRAL ADULT;  Surgeon: Valarie Merino, MD;  Location: [facility];  Service: General;;    is  allergic to nsaids.   Family History  Problem Relation Age of Onset   CVA Mother    Heart disease Mother        fibroelastoma on her heart, CAD, CABG   Cerebral aneurysm Mother    COPD Father    Liver cancer Father    Alcohol abuse Father    Drug abuse Brother    Alcohol abuse Brother    Drug abuse Brother    Alcohol abuse Brother    Heart disease Maternal Grandmother    Aneurysm Maternal Grandfather    Diabetes Paternal Grandmother    Drug abuse Son        in recovery   Asthma Son    Depression Son    ADD / ADHD Son    Anxiety disorder Son    Tourette syndrome Son    Anesthesia problems Neg Hx    Hypotension Neg Hx    Malignant hyperthermia Neg Hx    Pseudochol deficiency Neg Hx    Stroke Neg Hx     Social History   Tobacco Use   Smoking status: Never    Passive exposure: Past   Smokeless tobacco: Never  Vaping Use   Vaping status: Never Used  Substance Use Topics   Alcohol use: No   Drug use: No     Review of Systems was negative for a full 10 system review except as noted in the History of Present Illness.   Current Outpatient Medications:    amphetamine-dextroamphetamine (ADDERALL) 20 MG tablet, Take 1 tablet (20 mg total) by mouth 2 (two) times daily. do not fill for 60 days from 07/08/2023, Disp:  60 tablet, Rfl: 0   busPIRone (BUSPAR) 30 MG tablet, Take 1 tablet (30 mg total) by mouth 2 (two) times daily with food., Disp: 60 tablet, Rfl: 3   Calcium Carb-Cholecalciferol (CHEWABLE CALCIUM/D3 PO), , Disp: , Rfl:    cyanocobalamin 500 MCG tablet, Take 500 mcg by mouth daily., Disp: , Rfl:    DULoxetine (CYMBALTA) 30 MG capsule, Take 3 capsules (90 mg total) by mouth daily., Disp: 90 capsule, Rfl: 3   esomeprazole (NEXIUM) 40 MG capsule, Take 1 capsule (40 mg total) by mouth daily., Disp: 90 capsule, Rfl: 4   famotidine (PEPCID) 40 MG tablet, Take 0.5 tablets (20 mg total) by mouth 2 (two) times daily., Disp: 60 tablet, Rfl: 5   Iron-FA-B  Cmp-C-Biot-Probiotic (FUSION PLUS) CAPS, , Disp: , Rfl:    LORazepam (ATIVAN) 1 MG tablet, Take 1 tablet (1 mg total) by mouth every 12 (twelve) hours as needed for severe anxiety., Disp: 60 tablet, Rfl: 3   pramipexole (MIRAPEX) 1.5 MG tablet, Take 1 tablet (1.5 mg total) by mouth at bedtime., Disp: 30 tablet, Rfl: 3   rizatriptan (MAXALT) 10 MG tablet, Take 1 tablet (10 mg total) by mouth as needed for migraine. May repeat in 2 hours if needed, Disp: 10 tablet, Rfl: 3   traZODone (DESYREL) 100 MG tablet, Take 1-2 tablets (100-200 mg total) by mouth at bedtime as needed for insomnia., Disp: 60 tablet, Rfl: 2   LORazepam (ATIVAN) 1 MG tablet, Take 1 tablet (1 mg total) by mouth every 12 (twelve) hours as needed for anxiety., Disp: 60 tablet, Rfl: 3   Objective Vitals:   10/16/23 0809  BP: 120/73  Pulse: (!) 105    Gen: NAD CV: S1 S2 RRR Lungs: Clear to auscultation bilaterally Abd: soft, nontender   Previous Pelvic Exam showed: POP-Q (08/05/23)   -0.5                                            Aa   -0.5                                           Ba   -4.5                                              C    4                                            Gh   3.5                                            Pb   8                                            tvl    -  1.5                                            Ap   -1.5                                            Bp   -7.5                                              D         Assessment/ Plan  Assessment: The patient is a 47 y.o. year old scheduled to undergo Robotic assisted total laparoscopic hysterectomy with bilateral salpingectomy, sacrocolpopexy, perineoplasty, midurethral sling, cystoscopy.   Plan: General Surgical Consent: The patient has previously been counseled on alternative treatments, and the decision by the patient and provider was to proceed with the procedure listed above.  For all procedures, there are  risks of bleeding, infection, damage to surrounding organs including but not limited to bowel, bladder, blood vessels, ureters and nerves, and need for further surgery if an injury were to occur. These risks are all low with minimally invasive surgery.   There are risks of numbness and weakness at any body site or buttock/rectal pain.  It is possible that baseline pain can be worsened by surgery, either with or without mesh. If surgery is vaginal, there is also a low risk of possible conversion to laparoscopy or open abdominal incision where indicated. Very rare risks include blood transfusion, blood clot, heart attack, pneumonia, or death.   There is also a risk of short-term postoperative urinary retention with need to use a catheter. About half of patients need to go home from surgery with a catheter, which is then later removed in the office. The risk of long-term need for a catheter is very low. There is also a risk of worsening of overactive bladder.   Sling: The effectiveness of a midurethral vaginal mesh sling is approximately 85%, and thus, there will be times when you may leak urine after surgery, especially if your bladder is full or if you have a strong cough. There is a balance between making the sling tight enough to treat your leakage but not too tight so that you have long-term difficulty emptying your bladder. A mesh sling will not directly treat overactive bladder/urge incontinence and may worsen it.  There is an FDA safety notification on vaginal mesh procedures for prolapse but NOT mesh slings. We have extensive experience and training with mesh placement and we have close postoperative follow up to identify any potential complications from mesh. It is important to realize that this mesh is a permanent implant that cannot be easily removed. There are rare risks of mesh exposure (2-4%), pain with intercourse (0-7%), and infection (<1%). The risk of mesh exposure if more likely in a woman  with risks for poor healing (prior radiation, poorly controlled diabetes, or immunocompromised). The risk of new or worsened chronic pain after mesh implant is more common in women with baseline chronic pain and/or poorly controlled anxiety or depression. Approximately 2-4% of patients will experience longer-term post-operative voiding dysfunction that may require surgical revision  of the sling. We also reviewed that postoperatively, her stream may not be as strong as before surgery.    Prolapse (with or without mesh): Risk factors for surgical failure  include things that put pressure on your pelvis and the surgical repair, including obesity, chronic cough, and heavy lifting or straining (including lifting children or adults, straining on the toilet, or lifting heavy objects such as furniture or anything weighing >25 lbs. Risks of recurrence is 20-30% with vaginal native tissue repair and a less than 10% with sacrocolpopexy with mesh.    Sacrocolpopexy: Mesh implants may provide more prolapse support, but do have some unique risks to consider. It is important to understand that mesh is permanent and cannot be easily removed. Risks of abdominal sacrocolpopexy mesh include mesh exposure (~3-6%), painful intercourse (recent studies show lower rates after surgery compared to before, with ~5-8% risk of new onset), and very rare risks of bowel or bladder injury or infection (<1%). The risk of mesh exposure is more likely in a woman with risks for poor healing (prior radiation, poorly controlled diabetes, or immunocompromised). The risk of new or worsened chronic pain after mesh implant is more common in women with baseline chronic pain and/or poorly controlled anxiety or depression. There is an FDA safety notification on vaginal mesh procedures for prolapse but NOT abdominal mesh procedures and therefore does not apply to your surgery. We have extensive experience and training with mesh placement and we have close  postoperative follow up to identify any potential complications from mesh.    We discussed consent for blood products. Risks for blood transfusion include allergic reactions, other reactions that can affect different body organs and managed accordingly, transmission of infectious diseases such as HIV or Hepatitis. However, the blood is screened. Patient consents for blood products.  Pre-operative instructions:  She was instructed to not take Aspirin/NSAIDs x 7days prior to surgery.  Antibiotic prophylaxis was ordered as indicated.  Catheter use: Patient will go home with foley if needed after post-operative voiding trial.  Post-operative instructions:  She was provided with specific post-operative instructions, including precautions and signs/symptoms for which we would recommend contacting us, in addition to daytime and after-hours contact phone numbers. This was provided on a handout.   Post-operative medications: Prescriptions for tylenol, miralax, robaxin and oxycodone were sent to her pharmacy. Discussed using ibuprofen and tylenol on a schedule to limit use of narcotics. She cannot take motrin due to hx of gastric ulcers  Laboratory testing:  We will check labs: CBC, type and screen, UPT  Preoperative clearance:  She does not require surgical clearance.    Post-operative follow-up:  A post-operative appointment will be made for 6 weeks from the date of surgery. If she needs a post-operative nurse visit for a voiding trial, that will be set up after she leaves the hospital.    Patient will call the clinic or use MyChart should anything change or any new issues arise.   Marguerita Beards, MD

## 2023-10-16 NOTE — H&P (Signed)
[facility] Pre Op H&P  Subjective Chief Complaint: [name] presents for a preoperative encounter.   History of Present Illness: [name] is a 47 y.o. female who presents for preoperative visit.  She is scheduled to undergo Robotic assisted total laparoscopic hysterectomy with bilateral salpingectomy, sacrocolpopexy, perineoplasty, midurethral sling, cystoscopy, possible vaginal procedure if robotic not possible on 08/05/23.  Her symptoms include vaginal bulge and incontinence, and she was was found to have Stage II anterior, Stage II posterior, Stage I apical prolapse.   Urodynamic Impression:  1. Sensation was reduced; capacity was normal 2. Stress Incontinence was demonstrated at normal pressures; 3. Detrusor Overactivity was demonstrated without leakage. 4. Emptying was dysfunctional with a normal PVR, a sustained detrusor contraction present,  abdominal straining present, dyssynergic urethral sphincter activity on EMG.  Past Medical History:  Diagnosis Date   ADHD (attention deficit hyperactivity disorder)    Anemia    Depression    GERD (gastroesophageal reflux disease)    Headache(784.0)    Hypertension    IBS (irritable bowel syndrome)    Incisional hernia    SVD (spontaneous vaginal delivery) 10/17/2011   Ulcer      Past Surgical History:  Procedure Laterality Date   BOWEL RESECTION  06/05/2022   due to intususception   CHOLECYSTECTOMY  11/24/2004   ESOPHAGOGASTRODUODENOSCOPY  11/28/2011   Procedure: ESOPHAGOGASTRODUODENOSCOPY (EGD);  Surgeon: Theda Belfast, MD;  Location: [facility];  Service: Endoscopy;  Laterality: N/A;   lap repair gastrojejunal anastomotic perforation  02/21/2011   Dr Andrey Campanile   LAPAROSCOPIC GASTROTOMY W/ REPAIR OF ULCER  10/23/2009   ROUX-EN-Y GASTRIC BYPASS  11/25/2003   SMALL BOWEL REPAIR  06/05/2022   VENTRAL HERNIA REPAIR  12/22/2011   Procedure: HERNIA REPAIR VENTRAL ADULT;  Surgeon: Valarie Merino, MD;   Location: [facility];  Service: General;;    is allergic to nsaids.   Family History  Problem Relation Age of Onset   CVA Mother    Heart disease Mother        fibroelastoma on her heart, CAD, CABG   Cerebral aneurysm Mother    COPD Father    Liver cancer Father    Alcohol abuse Father    Drug abuse Brother    Alcohol abuse Brother    Drug abuse Brother    Alcohol abuse Brother    Heart disease Maternal Grandmother    Aneurysm Maternal Grandfather    Diabetes Paternal Grandmother    Drug abuse Son        in recovery   Asthma Son    Depression Son    ADD / ADHD Son    Anxiety disorder Son    Tourette syndrome Son    Anesthesia problems Neg Hx    Hypotension Neg Hx    Malignant hyperthermia Neg Hx    Pseudochol deficiency Neg Hx    Stroke Neg Hx     Social History   Tobacco Use   Smoking status: Never    Passive exposure: Past   Smokeless tobacco: Never  Vaping Use   Vaping status: Never Used  Substance Use Topics   Alcohol use: No   Drug use: No     Review of Systems was negative for a full 10 system review except as noted in the History of Present Illness.  No current facility-administered medications for this encounter.  Current Outpatient Medications:    acetaminophen (TYLENOL) 500 MG tablet, Take 1 tablet (500 mg total) by  mouth every 6 (six) hours as needed (pain)., Disp: 30 tablet, Rfl: 0   amphetamine-dextroamphetamine (ADDERALL) 20 MG tablet, Take 1 tablet (20 mg total) by mouth 2 (two) times daily. do not fill for 60 days from 07/08/2023, Disp: 60 tablet, Rfl: 0   busPIRone (BUSPAR) 30 MG tablet, Take 1 tablet (30 mg total) by mouth 2 (two) times daily with food., Disp: 60 tablet, Rfl: 3   Calcium Carb-Cholecalciferol (CHEWABLE CALCIUM/D3 PO), , Disp: , Rfl:    cyanocobalamin 500 MCG tablet, Take 500 mcg by mouth daily., Disp: , Rfl:    DULoxetine (CYMBALTA) 30 MG capsule, Take 3 capsules (90 mg total) by mouth daily., Disp: 90 capsule, Rfl: 3    esomeprazole (NEXIUM) 40 MG capsule, Take 1 capsule (40 mg total) by mouth daily., Disp: 90 capsule, Rfl: 4   famotidine (PEPCID) 40 MG tablet, Take 0.5 tablets (20 mg total) by mouth 2 (two) times daily., Disp: 60 tablet, Rfl: 5   Iron-FA-B Cmp-C-Biot-Probiotic (FUSION PLUS) CAPS, , Disp: , Rfl:    LORazepam (ATIVAN) 1 MG tablet, Take 1 tablet (1 mg total) by mouth every 12 (twelve) hours as needed for severe anxiety., Disp: 60 tablet, Rfl: 3   LORazepam (ATIVAN) 1 MG tablet, Take 1 tablet (1 mg total) by mouth every 12 (twelve) hours as needed for anxiety., Disp: 60 tablet, Rfl: 3   methocarbamol (ROBAXIN-750) 750 MG tablet, Take 1 tablet (750 mg total) by mouth every 6 (six) hours as needed for muscle spasms., Disp: 30 tablet, Rfl: 0   oxyCODONE (OXY IR/ROXICODONE) 5 MG immediate release tablet, Take 1 tablet (5 mg total) by mouth every 4 (four) hours as needed for severe pain (pain score 7-10)., Disp: 15 tablet, Rfl: 0   polyethylene glycol powder (GLYCOLAX/MIRALAX) 17 GM/SCOOP powder, Take 17 g by mouth daily. Drink 17g (1 scoop) dissolved in water per day., Disp: 238 g, Rfl: 0   pramipexole (MIRAPEX) 1.5 MG tablet, Take 1 tablet (1.5 mg total) by mouth at bedtime., Disp: 30 tablet, Rfl: 3   rizatriptan (MAXALT) 10 MG tablet, Take 1 tablet (10 mg total) by mouth as needed for migraine. May repeat in 2 hours if needed, Disp: 10 tablet, Rfl: 3   traZODone (DESYREL) 100 MG tablet, Take 1-2 tablets (100-200 mg total) by mouth at bedtime as needed for insomnia., Disp: 60 tablet, Rfl: 2   Objective There were no vitals filed for this visit.   Gen: NAD CV: S1 S2 RRR Lungs: Clear to auscultation bilaterally Abd: soft, nontender   Previous Pelvic Exam showed: POP-Q (08/05/23)   -0.5                                            Aa   -0.5                                           Ba   -4.5                                              C    4  Gh   3.5                                             Pb   8                                            tvl    -1.5                                            Ap   -1.5                                            Bp   -7.5                                              D         Assessment/ Plan  The patient is a 47 y.o. year old scheduled to undergo Robotic assisted total laparoscopic hysterectomy with bilateral salpingectomy, sacrocolpopexy, perineoplasty, midurethral sling, cystoscopy, possible vaginal procedure if robotic not possible   Marguerita Beards, MD

## 2023-10-17 ENCOUNTER — Other Ambulatory Visit (HOSPITAL_BASED_OUTPATIENT_CLINIC_OR_DEPARTMENT_OTHER): Payer: Self-pay

## 2023-10-19 ENCOUNTER — Other Ambulatory Visit (HOSPITAL_BASED_OUTPATIENT_CLINIC_OR_DEPARTMENT_OTHER): Payer: Self-pay

## 2023-10-19 NOTE — Progress Notes (Signed)
[name] is a 47 y.o. female called and notified of that FMLA paper were received by the UROGYN dept from [employer] management Pt notified there will be a 10-14 day turn around for forms to be ready.  Pt notified  she will be contacted as soon as the from are ready and have been faxed.  Pre-op date: 10/16/2023 Surgery date: 11/11/2023 Post op date:12/23/2023

## 2023-10-20 ENCOUNTER — Other Ambulatory Visit (HOSPITAL_BASED_OUTPATIENT_CLINIC_OR_DEPARTMENT_OTHER): Payer: Self-pay

## 2023-10-20 ENCOUNTER — Other Ambulatory Visit: Payer: Self-pay

## 2023-10-26 DIAGNOSIS — F4323 Adjustment disorder with mixed anxiety and depressed mood: Secondary | ICD-10-CM | POA: Diagnosis not present

## 2023-10-27 ENCOUNTER — Other Ambulatory Visit (HOSPITAL_BASED_OUTPATIENT_CLINIC_OR_DEPARTMENT_OTHER): Payer: Self-pay

## 2023-10-27 ENCOUNTER — Other Ambulatory Visit: Payer: Self-pay

## 2023-10-27 MED ORDER — AMPHETAMINE-DEXTROAMPHETAMINE 20 MG PO TABS
20.0000 mg | ORAL_TABLET | Freq: Two times a day (BID) | ORAL | 0 refills | Status: DC
  Filled 2023-10-27: qty 60, 30d supply, fill #0

## 2023-10-27 MED ORDER — PRAMIPEXOLE DIHYDROCHLORIDE 1.5 MG PO TABS
1.5000 mg | ORAL_TABLET | Freq: Every day | ORAL | 3 refills | Status: DC
  Filled 2023-10-27 – 2023-10-29 (×2): qty 30, 30d supply, fill #0
  Filled 2023-11-27: qty 30, 30d supply, fill #1

## 2023-10-27 MED ORDER — AMPHETAMINE-DEXTROAMPHETAMINE 20 MG PO TABS
20.0000 mg | ORAL_TABLET | Freq: Two times a day (BID) | ORAL | 0 refills | Status: DC
  Filled 2023-11-27: qty 60, 30d supply, fill #0

## 2023-10-27 MED ORDER — AMPHETAMINE-DEXTROAMPHETAMINE 20 MG PO TABS: 20.0000 mg | ORAL_TABLET | Freq: Two times a day (BID) | ORAL | 0 refills | Status: DC

## 2023-10-27 MED ORDER — DULOXETINE HCL 30 MG PO CPEP
90.0000 mg | ORAL_CAPSULE | Freq: Every day | ORAL | 3 refills | Status: DC
  Filled 2023-10-27: qty 90, 30d supply, fill #0
  Filled 2023-11-27: qty 90, 30d supply, fill #1

## 2023-10-29 ENCOUNTER — Other Ambulatory Visit (HOSPITAL_BASED_OUTPATIENT_CLINIC_OR_DEPARTMENT_OTHER): Payer: Self-pay

## 2023-11-03 ENCOUNTER — Encounter (HOSPITAL_BASED_OUTPATIENT_CLINIC_OR_DEPARTMENT_OTHER): Payer: Self-pay | Admitting: Obstetrics and Gynecology

## 2023-11-04 ENCOUNTER — Encounter (HOSPITAL_BASED_OUTPATIENT_CLINIC_OR_DEPARTMENT_OTHER): Payer: Self-pay | Admitting: Obstetrics and Gynecology

## 2023-11-04 NOTE — Progress Notes (Signed)
Your procedure is scheduled on :  Wednesday,  11-11-2023  Report to [facility] [place] AT  __10:30_ AM.   Call this number if you have problems the morning of surgery  :[phone]. Any questions prior to surgery call pre-op nurse, Majestic Molony :  [phone]   OUR ADDRESS IS [address].  WE ARE LOCATED IN [facility]  PLEASE BRING YOUR INSURANCE CARD AND PHOTO ID DAY OF SURGERY.                                     REMEMBER: Do not eat food after midnight night before surgery.  You may have clear liquid diet from midnight night before surgery until 9:30 AM.  NO clear liquids after 9:30 AM day of surgery.  This includes no water,  candy/  gum/  mints.   CLEAR LIQUID DIET Allowed      Water                                                                   Coffee and tea, regular and decaf  (NO cream or milk products of any type, may sweeten, no honey)                         Carbonated beverages, regular and diet                                    Sports drinks like Gatorade _____________________________________________________________________     TAKE ONLY THESE MEDICATIONS MORNING OF SURGERY: Buspirone (buspar) Duloxetine (cymbalta) Esomeprazole (nexium) Famotidine (pepcid) If needed may take , Lorazepam (ativan)  Do Not Take Adderall morning of surgery                                       DO NOT WEAR JEWERLY/  METAL/  PIERCINGS (INCLUDING NO PLASTIC PIERCINGS) DO NOT WEAR LOTIONS, POWDERS, PERFUMES OR NAIL POLISH ON YOUR FINGERNAILS. TOENAIL POLISH IS OK TO WEAR. DO NOT SHAVE FOR 48 HOURS PRIOR TO DAY OF SURGERY.  CONTACTS, GLASSES, OR DENTURES MAY NOT BE WORN TO SURGERY.  REMEMBER: NO SMOKING, VAPING ,  DRUGS OR ALCOHOL FOR 24 HOURS BEFORE YOUR SURGERY.                                    [place] IS NOT RESPONSIBLE  FOR ANY BELONGINGS.                                                                    Marland Kitchen           [facility] - Preparing for Surgery  Before surgery, you can play an important role.  Because skin is not sterile, your skin needs to be as free of germs as possible.  You can reduce the number of germs on your skin by washing with CHG (chlorahexidine gluconate) soap before surgery.  CHG is an antiseptic cleaner which kills germs and bonds with the skin to continue killing germs even after washing. Please DO NOT use if you have an allergy to CHG or antibacterial soaps.  If your skin becomes reddened/irritated stop using the CHG and inform your nurse when you arrive at Short Stay. Do not shave (including legs and underarms) for at least 48 hours prior to the first CHG shower.  You may shave your face/neck. Please follow these instructions carefully:  1.  Shower with CHG Soap the night before surgery and the  morning of Surgery.  2.  If you choose to wash your hair, wash your hair first as usual with your  normal  shampoo.  3.  After you shampoo, rinse your hair and body thoroughly to remove the  shampoo.                                        4.  Use CHG as you would any other liquid soap.  You can apply chg directly  to the skin and wash , chg soap provided, night before and morning of your surgery.  5.  Apply the CHG Soap to your body ONLY FROM THE NECK DOWN.   Do not use on face/ open                           Wound or open sores. Avoid contact with eyes, ears mouth and genitals (private parts).                       Wash face,  Genitals (private parts) with your normal soap.             6.  Wash thoroughly, paying special attention to the area where your surgery  will be performed.  7.  Thoroughly rinse your body with warm water from the neck down.  8.  DO NOT shower/wash with your normal soap after using and rinsing off  the CHG Soap.             9.  Pat yourself dry with a clean towel.            10.  Wear clean pajamas.            11.  Place clean sheets on your bed the night of your first shower  and do not  sleep with pets. Day of Surgery : Do not apply any lotions/ powders the morning of surgery.  Please wear clean clothes to the hospital/surgery center.  IF YOU HAVE ANY SKIN IRRITATION OR PROBLEMS WITH THE SURGICAL SOAP, PLEASE GET A BAR OF GOLD DIAL SOAP AND SHOWER THE NIGHT BEFORE YOUR SURGERY AND THE MORNING OF YOUR SURGERY. PLEASE LET THE NURSE KNOW MORNING OF YOUR SURGERY IF YOU HAD ANY PROBLEMS WITH THE SURGICAL SOAP.   YOUR SURGEON MAY HAVE REQUESTED EXTENDED RECOVERY TIME AFTER YOUR SURGERY. IT COULD BE A  JUST A FEW HOURS  UP TO AN OVERNIGHT STAY.  YOUR SURGEON SHOULD HAVE DISCUSSED THIS WITH YOU PRIOR TO YOUR  SURGERY. IN THE EVENT YOU NEED TO STAY OVERNIGHT PLEASE REFER TO THE FOLLOWING GUIDELINES. YOU MAY HAVE UP TO 4 VISITORS  MAY VISIT IN THE EXTENDED RECOVERY ROOM UNTIL 800 PM ONLY.  ONE  VISITOR AGE 45 AND OVER MAY SPEND THE NIGHT AND MUST BE IN EXTENDED RECOVERY ROOM NO LATER THAN 800 PM . YOUR DISCHARGE TIME AFTER YOU SPEND THE NIGHT IS 900 AM THE MORNING AFTER YOUR SURGERY. YOU MAY PACK A SMALL OVERNIGHT BAG WITH TOILETRIES FOR YOUR OVERNIGHT STAY IF YOU WISH.  REGARDLESS OF IF YOU STAY OVER NIGHT OR ARE DISCHARGED THE SAME DAY YOU WILL BE REQUIRED TO HAVE A RESPONSIBLE ADULT (18 YRS OLD OR OLDER) STAY WITH YOU FOR AT LEAST THE FIRST 24 HOURS WHEN HOME  YOUR PRESCRIPTION MEDICATIONS WILL BE PROVIDED DURING [facility] STAY.  ________________________________________________________________________

## 2023-11-04 NOTE — Progress Notes (Signed)
Spoke w/ via phone for pre-op interview--- pt Lab needs dos----   urine preg      Lab results------ lab appt 11/09/2023 @ 0900 getting CBC/ T&S COVID test -----patient states asymptomatic no test needed Arrive at -------  1030 on 11-11-2023 NPO after MN NO Solid Food.  Clear liquids from MN until--- 0930 Med rec completed Medications to take morning of surgery ----- nexium, pepic, buspar buspar if needed Diabetic medication ----- n/a Patient instructed no nail polish to be worn day of surgery Patient instructed to bring photo id and insurance card day of surgery Patient aware to have Driver (ride ) / caregiver    for 24 hours after surgery - mother, harriett Patient Special Instructions ----- will pick bag at lab appt w/ hibiclens and written instructions Pre-Op special Instructions ----- n/a Patient verbalized understanding of instructions that were given at this phone interview. Patient denies chest pain, sob, fever, cough at the interview.

## 2023-11-05 DIAGNOSIS — F840 Autistic disorder: Secondary | ICD-10-CM | POA: Diagnosis not present

## 2023-11-08 ENCOUNTER — Other Ambulatory Visit (HOSPITAL_BASED_OUTPATIENT_CLINIC_OR_DEPARTMENT_OTHER): Payer: Self-pay

## 2023-11-09 ENCOUNTER — Telehealth: Payer: Self-pay | Admitting: *Deleted

## 2023-11-09 ENCOUNTER — Encounter (HOSPITAL_COMMUNITY)
Admission: RE | Admit: 2023-11-09 | Discharge: 2023-11-09 | Disposition: A | Discharge status: Home / Self Care | Payer: Commercial Managed Care - PPO | Source: Ambulatory Visit | Attending: Obstetrics and Gynecology | Admitting: Obstetrics and Gynecology

## 2023-11-09 DIAGNOSIS — Z01818 Encounter for other preprocedural examination: Secondary | ICD-10-CM | POA: Diagnosis present

## 2023-11-09 DIAGNOSIS — Z01812 Encounter for preprocedural laboratory examination: Secondary | ICD-10-CM | POA: Insufficient documentation

## 2023-11-09 DIAGNOSIS — F4323 Adjustment disorder with mixed anxiety and depressed mood: Secondary | ICD-10-CM | POA: Diagnosis not present

## 2023-11-09 LAB — CBC
HCT: 43.2 % (ref 36.0–46.0)
Hemoglobin: 13.8 g/dL (ref 12.0–15.0)
MCH: 30.8 pg (ref 26.0–34.0)
MCHC: 31.9 g/dL (ref 30.0–36.0)
MCV: 96.4 fL (ref 80.0–100.0)
Platelets: 297 10*3/uL (ref 150–400)
RBC: 4.48 MIL/uL (ref 3.87–5.11)
RDW: 12.6 % (ref 11.5–15.5)
WBC: 5.7 10*3/uL (ref 4.0–10.5)
nRBC: 0 % (ref 0.0–0.2)

## 2023-11-09 NOTE — Telephone Encounter (Signed)
TC to pt.  LM on pt's cell VM that surgery for 12/18 is cancelled and she will be called to discuss rescheduling and a care plan.  I left the office's phone number as well to call if has not heard from the office in regards to rescheduling.  KWD CMA

## 2023-11-10 ENCOUNTER — Encounter: Payer: Self-pay | Admitting: Obstetrics and Gynecology

## 2023-11-10 ENCOUNTER — Other Ambulatory Visit (HOSPITAL_BASED_OUTPATIENT_CLINIC_OR_DEPARTMENT_OTHER): Payer: Self-pay

## 2023-11-10 MED ORDER — TRAZODONE HCL 100 MG PO TABS
100.0000 mg | ORAL_TABLET | Freq: Every evening | ORAL | 2 refills | Status: DC | PRN
  Filled 2023-11-10: qty 60, 30d supply, fill #0
  Filled 2023-12-07: qty 60, 30d supply, fill #1

## 2023-11-10 NOTE — Telephone Encounter (Signed)
Spoke with patient regarding cancellation and she expressed understanding. Will call her to reschedule as soon as we can.   Marguerita Beards, MD

## 2023-11-11 ENCOUNTER — Ambulatory Visit (HOSPITAL_BASED_OUTPATIENT_CLINIC_OR_DEPARTMENT_OTHER)
Admission: RE | Admit: 2023-11-11 | Payer: Commercial Managed Care - PPO | Source: Home / Self Care | Admitting: Obstetrics and Gynecology

## 2023-11-11 DIAGNOSIS — Z01818 Encounter for other preprocedural examination: Secondary | ICD-10-CM

## 2023-11-11 HISTORY — DX: Personal history of peptic ulcer disease: Z87.11

## 2023-11-11 HISTORY — DX: Presence of spectacles and contact lenses: Z97.3

## 2023-11-11 HISTORY — DX: Nausea with vomiting, unspecified: R11.2

## 2023-11-11 HISTORY — DX: Personal history of other diseases of the circulatory system: Z86.79

## 2023-11-11 HISTORY — DX: Major depressive disorder, single episode, unspecified: F32.9

## 2023-11-11 HISTORY — DX: Insomnia, unspecified: G47.00

## 2023-11-11 HISTORY — DX: Generalized anxiety disorder: F41.1

## 2023-11-11 HISTORY — DX: Personal history of other diseases of the nervous system and sense organs: Z86.69

## 2023-11-11 HISTORY — DX: Other specified postprocedural states: Z98.890

## 2023-11-11 HISTORY — DX: Iron deficiency anemia, unspecified: D50.9

## 2023-11-11 HISTORY — DX: Irritable bowel syndrome with diarrhea: K58.0

## 2023-11-11 HISTORY — DX: Female genital prolapse, unspecified: N81.9

## 2023-11-11 HISTORY — DX: Stress incontinence (female) (male): N39.3

## 2023-11-11 LAB — TYPE AND SCREEN
ABO/RH(D): A POS
Antibody Screen: NEGATIVE

## 2023-11-11 SURGERY — HYSTERECTOMY, TOTAL, ROBOT-ASSISTED, WITH SACROCOLPOPEXY
Anesthesia: General

## 2023-11-17 ENCOUNTER — Other Ambulatory Visit: Payer: Self-pay

## 2023-11-27 ENCOUNTER — Encounter (HOSPITAL_BASED_OUTPATIENT_CLINIC_OR_DEPARTMENT_OTHER): Payer: Self-pay | Admitting: Obstetrics and Gynecology

## 2023-11-27 ENCOUNTER — Encounter: Payer: Self-pay | Admitting: Obstetrics and Gynecology

## 2023-11-27 ENCOUNTER — Other Ambulatory Visit (HOSPITAL_BASED_OUTPATIENT_CLINIC_OR_DEPARTMENT_OTHER): Payer: Self-pay

## 2023-11-27 NOTE — Progress Notes (Signed)
 Spoke w/ via phone for pre-op interview--- pt Lab needs dos----  urine preg   , T&S    Lab results------  pt had previous normal CBC done on 11-09-2023 in epic COVID test -----patient states asymptomatic no test needed Arrive at ------- 1030 on 12-08-2022 NPO after MN NO Solid Food.  Clear liquids from MN until--- 0930 Med rec completed Medications to take morning of surgery ----- nexium , pepcid , buspar , ativan  if needed Diabetic medication ----- n/a Patient instructed no nail polish to be worn day of surgery Patient instructed to bring photo id and insurance card day of surgery Patient aware to have Driver (ride ) / caregiver    for 24 hours after surgery - husband, jason Patient Special Instructions -----  pt same surgery was on 11-11-2023 and cancelled by surgeon , now is rescheduled.  Pt stated still has written instructions and hibiclens from before.  Pt verbalized understanding for follow same instructions with exception surgery is on 12-09-2023 Wednesday.   Pre-Op special Instructions ----- requested pre-op orders via inbox message in epic to dr schroeder Patient verbalized understanding of instructions that were given at this phone interview. Patient denies chest pain, sob, fever, cough at the interview.

## 2023-12-01 ENCOUNTER — Telehealth: Payer: Commercial Managed Care - PPO

## 2023-12-01 DIAGNOSIS — B369 Superficial mycosis, unspecified: Secondary | ICD-10-CM | POA: Diagnosis not present

## 2023-12-02 ENCOUNTER — Other Ambulatory Visit (HOSPITAL_BASED_OUTPATIENT_CLINIC_OR_DEPARTMENT_OTHER): Payer: Self-pay

## 2023-12-02 ENCOUNTER — Encounter: Payer: Self-pay | Admitting: Podiatry

## 2023-12-02 ENCOUNTER — Ambulatory Visit (INDEPENDENT_AMBULATORY_CARE_PROVIDER_SITE_OTHER): Payer: Commercial Managed Care - PPO

## 2023-12-02 ENCOUNTER — Ambulatory Visit: Payer: Commercial Managed Care - PPO | Admitting: Podiatry

## 2023-12-02 VITALS — Ht 66.75 in | Wt 227.0 lb

## 2023-12-02 DIAGNOSIS — M79671 Pain in right foot: Secondary | ICD-10-CM | POA: Diagnosis not present

## 2023-12-02 DIAGNOSIS — G8929 Other chronic pain: Secondary | ICD-10-CM | POA: Diagnosis not present

## 2023-12-02 MED ORDER — CLOTRIMAZOLE-BETAMETHASONE 1-0.05 % EX CREA
1.0000 | TOPICAL_CREAM | Freq: Every day | CUTANEOUS | 0 refills | Status: DC
  Filled 2023-12-02: qty 30, 30d supply, fill #0

## 2023-12-02 NOTE — Progress Notes (Signed)
 I have spent 5 minutes in review of e-visit questionnaire, review and updating patient chart, medical decision making and response to patient.   Piedad Climes, PA-C

## 2023-12-02 NOTE — Progress Notes (Signed)
 E Visit for Rash  We are sorry that you are not feeling well. Here is how we plan to help!  Based upon your presentation it appears you have a fungal infection.  I have prescribed: Lotrisone  to apply twice daily for 2 weeks.   HOME CARE:  Take cool showers and avoid direct sunlight. Apply cool compress or wet dressings. Take a bath in an oatmeal bath.  Sprinkle content of one Aveeno packet under running faucet with comfortably warm water .  Bathe for 15-20 minutes, 1-2 times daily.  Pat dry with a towel. Do not rub the rash. Use hydrocortisone cream. Take an antihistamine like Benadryl  for widespread rashes that itch.  The adult dose of Benadryl  is 25-50 mg by mouth 4 times daily. Caution:  This type of medication may cause sleepiness.  Do not drink alcohol, drive, or operate dangerous machinery while taking antihistamines.  Do not take these medications if you have prostate enlargement.  Read package instructions thoroughly on all medications that you take.  GET HELP RIGHT AWAY IF:  Symptoms don't go away after treatment. Severe itching that persists. If you rash spreads or swells. If you rash begins to smell. If it blisters and opens or develops a yellow-brown crust. You develop a fever. You have a sore throat. You become short of breath.  MAKE SURE YOU:  Understand these instructions. Will watch your condition. Will get help right away if you are not doing well or get worse.  Thank you for choosing an e-visit.  Your e-visit answers were reviewed by a board certified advanced clinical practitioner to complete your personal care plan. Depending upon the condition, your plan could have included both over the counter or prescription medications.  Please review your pharmacy choice. Make sure the pharmacy is open so you can pick up prescription now. If there is a problem, you may contact your provider through [employer] and have the prescription routed to another pharmacy.   Your safety is important to us . If you have drug allergies check your prescription carefully.   For the next 24 hours you can use MyChart to ask questions about today's visit, request a non-urgent call back, or ask for a work or school excuse. You will get an email in the next two days asking about your experience. I hope that your e-visit has been valuable and will speed your recovery.

## 2023-12-03 ENCOUNTER — Telehealth: Payer: Self-pay | Admitting: Podiatry

## 2023-12-03 NOTE — Telephone Encounter (Signed)
 DOS-12/22/23  EPF [id number]  [employer] EFFECTIVE DATE- 11/24/22  DEDUCTIBLE- $300.00 WITH REMAINING $300.00 OOP-$7900.00 WITH REMAINING $7,865.00   SPOKE WITH TONYA B FROM [employer] AND SHE STATED THAT PRIOR AUTH IS NOT REQUIRED FOR CPT CODE 70106.  CALL REF #: TONYA B 12/03/23 @ 10:22AM EST

## 2023-12-04 NOTE — Progress Notes (Signed)
 Subjective:   Patient ID: [name], female   DOB: 48 y.o.   MRN: [id number]   HPI Patient states her heel right is absolutely killing her and she knows that she is going to need to have it fixed.  States that she has tried so many different modalities without relief and is not getting any relief at the current time   ROS      Objective:  Physical Exam  Neurovascular status intact with acute discomfort plantar fascia right at the insertional point of the tendon into the calcaneus fluid buildup around the medial band which is hard for her to walk on     Assessment:  Acute plantar fasciitis right that has failed to respond to numerous conservative treatments     Plan:  H&P reviewed and discussed.  I reviewed at great length the different modalities that she has tried failure to respond and at this point I do think in this particular case surgical intervention would be in her best interest and I did review endoscopic relief with her and caregiver.  Patient wants to get this done and after extended discussion signed consent form after reviewing alternative treatments complications.  Patient is scheduled for outpatient surgery with all questions answered understands she will be in a boot for about 4 weeks total recovery to take 4 to 6 months.  Patient signed consent form and is dispensed air fracture walker properly fitted to her lower leg and instructed on getting used to it prior to surgery and finding shoe and the other foot that fits appropriately

## 2023-12-07 ENCOUNTER — Ambulatory Visit: Payer: Commercial Managed Care - PPO | Admitting: Podiatry

## 2023-12-07 DIAGNOSIS — F4323 Adjustment disorder with mixed anxiety and depressed mood: Secondary | ICD-10-CM | POA: Diagnosis not present

## 2023-12-08 ENCOUNTER — Encounter (HOSPITAL_COMMUNITY): Payer: Self-pay

## 2023-12-08 NOTE — Progress Notes (Signed)
 Patient's surgery was originally scheduled on 11/11/23. Patient called in and stated that she could not find her instructions, but still had her pre-surgical soap. I reviewed her instructions for surgery and sent her a copy via My Chart.

## 2023-12-08 NOTE — Anesthesia Preprocedure Evaluation (Addendum)
 Anesthesia Evaluation  Patient identified by MRN, date of birth, ID band Patient awake    Reviewed: Allergy & Precautions, NPO status , Patient's Chart, lab work & pertinent test results  History of Anesthesia Complications (+) PONV and history of anesthetic complications  Airway Mallampati: II  TM Distance: >3 FB Neck ROM: Full    Dental no notable dental hx. (+) Teeth Intact, Dental Advisory Given   Pulmonary neg pulmonary ROS   Pulmonary exam normal breath sounds clear to auscultation       Cardiovascular hypertension, Normal cardiovascular exam Rhythm:Regular Rate:Normal     Neuro/Psych  PSYCHIATRIC DISORDERS Anxiety Depression    ADHDnegative neurological ROS     GI/Hepatic Neg liver ROS, PUD,GERD  Medicated,,  Endo/Other  negative endocrine ROS    Renal/GU      Musculoskeletal   Abdominal   Peds  Hematology Lab Results      Component                Value               Date                      WBC                      5.7                 11/09/2023                HGB                      13.8                11/09/2023                HCT                      43.2                11/09/2023                MCV                      96.4                11/09/2023                PLT                      297                 11/09/2023              Anesthesia Other Findings All: NSAIDS  Reproductive/Obstetrics Pt was unable to give urine sample . But denies any possibility that she could be pregnant                             Anesthesia Physical Anesthesia Plan  ASA: 3  Anesthesia Plan: General   Post-op Pain Management: Ketamine  IV*, Toradol  IV (intra-op)* and Tylenol  PO (pre-op)*   Induction: Intravenous  PONV Risk Score and Plan: Scopolamine  patch - Pre-op, Midazolam , Dexamethasone , Ondansetron , Treatment may vary due to age or medical condition and TIVA  Airway Management  Planned: Oral ETT  Additional Equipment: None  Intra-op Plan:  Post-operative Plan: Extubation in OR  Informed Consent: I have reviewed the patients History and Physical, chart, labs and discussed the procedure including the risks, benefits and alternatives for the proposed anesthesia with the patient or authorized representative who has indicated his/her understanding and acceptance.     Dental advisory given  Plan Discussed with: CRNA and Anesthesiologist  Anesthesia Plan Comments:         Anesthesia Quick Evaluation

## 2023-12-08 NOTE — Progress Notes (Signed)
 Your procedure is scheduled on :  Wednesday,  12/09/23  Report to [facility] AT  __10:30_ AM.    Call this number if you have problems the morning of surgery  :[phone]. Any questions prior to surgery call pre-op nurse, Denise :  [phone]    OUR ADDRESS IS [address].  WE ARE LOCATED IN THE [facility]   PLEASE BRING YOUR INSURANCE CARD AND PHOTO ID DAY OF SURGERY.                                     REMEMBER: Do not eat food after midnight night before surgery.  You may have clear liquid diet from midnight night before surgery until 9:30 AM.  NO clear liquids after 9:30 AM day of surgery.  This includes no water ,  candy/  gum/  mints.     CLEAR LIQUID DIET Allowed       Water                                                                    Coffee and tea, regular and decaf  (NO cream or milk products of any type, may sweeten, no honey)                         Carbonated beverages, regular and diet                                    Sports drinks like Gatorade _____________________________________________________________________      TAKE ONLY THESE MEDICATIONS MORNING OF SURGERY: Buspirone  (buspar ) Duloxetine  (cymbalta ) Esomeprazole  (nexium ) Famotidine  (pepcid ) If needed may take , Lorazepam  (ativan )  Do Not Take Adderall morning of surgery                                        DO NOT WEAR JEWERLY/  METAL/  PIERCINGS (INCLUDING NO PLASTIC PIERCINGS) DO NOT WEAR LOTIONS, POWDERS, PERFUMES OR NAIL POLISH ON YOUR FINGERNAILS. TOENAIL POLISH IS OK TO WEAR. DO NOT SHAVE FOR 48 HOURS PRIOR TO DAY OF SURGERY.   CONTACTS, GLASSES, OR DENTURES MAY NOT BE WORN TO SURGERY.   REMEMBER: NO SMOKING, VAPING ,  DRUGS OR ALCOHOL FOR 24 HOURS BEFORE YOUR SURGERY.                                     IS NOT RESPONSIBLE  FOR ANY BELONGINGS.                                                                    [name] - Preparing for Surgery Before surgery, you can play an important role.  Because skin is not sterile, your skin needs to be as free of germs as possible.  You can reduce the number of germs on your skin by washing with CHG (chlorahexidine gluconate) soap before surgery.  CHG is an antiseptic cleaner which kills germs and bonds with the skin to continue killing germs even after washing. Please DO NOT use if you have an allergy to CHG or antibacterial soaps.  If your skin becomes reddened/irritated stop using the CHG and inform your nurse when you arrive at Short Stay. Do not shave (including legs and underarms) for at least 48 hours prior to the first CHG shower.  You may shave your face/neck. Please follow these instructions carefully:             1.  Shower with CHG Soap the night before surgery and the  morning of Surgery.             2.  If you choose to wash your hair, wash your hair first as usual with your  normal  shampoo.             3.  After you shampoo, rinse your hair and body thoroughly to remove the  shampoo.                                            4.  Use CHG as you would any other liquid soap.  You can apply chg directly  to the skin and wash , chg soap provided, night before and morning of your surgery.             5.  Apply the CHG Soap to your body ONLY FROM THE NECK DOWN.   Do not use on face/ open                           Wound or open sores. Avoid contact with eyes, ears mouth and genitals (private parts).                       Wash face,  Genitals (private parts) with your normal soap.             6.  Wash thoroughly, paying special attention to the area where your surgery  will be performed.             7.  Thoroughly rinse your body with warm water  from the neck down.             8.  DO NOT shower/wash with your normal soap after using and rinsing off  the CHG Soap.             9.  Pat yourself dry with a clean towel.            10.  Wear clean pajamas.             11.  Place clean sheets on your bed the night of your first shower and do not  sleep with pets. Day of Surgery : Do not apply any lotions/ powders the morning of surgery.  Please wear clean clothes to the hospital/surgery center.   IF YOU HAVE ANY SKIN IRRITATION OR PROBLEMS WITH THE SURGICAL SOAP, PLEASE  GET A BAR OF GOLD DIAL SOAP AND SHOWER THE NIGHT BEFORE YOUR SURGERY AND THE MORNING OF YOUR SURGERY. PLEASE LET THE NURSE KNOW MORNING OF YOUR SURGERY IF YOU HAD ANY PROBLEMS WITH THE SURGICAL SOAP.     YOUR SURGEON MAY HAVE REQUESTED EXTENDED RECOVERY TIME AFTER YOUR SURGERY. IT COULD BE A  JUST A FEW HOURS  UP TO AN OVERNIGHT STAY.  YOUR SURGEON SHOULD HAVE DISCUSSED THIS WITH YOU PRIOR TO YOUR SURGERY. IN THE EVENT YOU NEED TO STAY OVERNIGHT PLEASE REFER TO THE FOLLOWING GUIDELINES. YOU MAY HAVE UP TO 4 VISITORS  MAY VISIT IN THE EXTENDED RECOVERY ROOM UNTIL 800 PM ONLY.  ONE  VISITOR AGE 50 AND OVER MAY SPEND THE NIGHT AND MUST BE IN EXTENDED RECOVERY ROOM NO LATER THAN 800 PM . YOUR DISCHARGE TIME AFTER YOU SPEND THE NIGHT IS 900 AM THE MORNING AFTER YOUR SURGERY. YOU MAY PACK A SMALL OVERNIGHT BAG WITH TOILETRIES FOR YOUR OVERNIGHT STAY IF YOU WISH.   REGARDLESS OF IF YOU STAY OVER NIGHT OR ARE DISCHARGED THE SAME DAY YOU WILL BE REQUIRED TO HAVE A RESPONSIBLE ADULT (50 YRS OLD OR OLDER) STAY WITH YOU FOR AT LEAST THE FIRST 24 HOURS WHEN HOME   YOUR PRESCRIPTION MEDICATIONS WILL BE PROVIDED DURING YOUR HOSPITAL STAY.

## 2023-12-09 ENCOUNTER — Other Ambulatory Visit: Payer: Self-pay

## 2023-12-09 ENCOUNTER — Ambulatory Visit (HOSPITAL_BASED_OUTPATIENT_CLINIC_OR_DEPARTMENT_OTHER): Payer: Self-pay | Admitting: Anesthesiology

## 2023-12-09 ENCOUNTER — Encounter (HOSPITAL_BASED_OUTPATIENT_CLINIC_OR_DEPARTMENT_OTHER)
Admission: RE | Disposition: A | Discharge status: Home / Self Care | Payer: Self-pay | Source: Home / Self Care | Attending: Obstetrics and Gynecology

## 2023-12-09 ENCOUNTER — Ambulatory Visit (HOSPITAL_BASED_OUTPATIENT_CLINIC_OR_DEPARTMENT_OTHER): Payer: Commercial Managed Care - PPO | Admitting: Anesthesiology

## 2023-12-09 ENCOUNTER — Encounter (HOSPITAL_BASED_OUTPATIENT_CLINIC_OR_DEPARTMENT_OTHER): Payer: Self-pay | Admitting: Obstetrics and Gynecology

## 2023-12-09 ENCOUNTER — Ambulatory Visit (HOSPITAL_BASED_OUTPATIENT_CLINIC_OR_DEPARTMENT_OTHER)
Admission: RE | Admit: 2023-12-09 | Discharge: 2023-12-10 | Disposition: A | Discharge status: Home / Self Care | Payer: Commercial Managed Care - PPO | Attending: Obstetrics and Gynecology | Admitting: Obstetrics and Gynecology

## 2023-12-09 DIAGNOSIS — F909 Attention-deficit hyperactivity disorder, unspecified type: Secondary | ICD-10-CM | POA: Diagnosis not present

## 2023-12-09 DIAGNOSIS — F32A Depression, unspecified: Secondary | ICD-10-CM | POA: Diagnosis not present

## 2023-12-09 DIAGNOSIS — Z8249 Family history of ischemic heart disease and other diseases of the circulatory system: Secondary | ICD-10-CM | POA: Insufficient documentation

## 2023-12-09 DIAGNOSIS — N83291 Other ovarian cyst, right side: Secondary | ICD-10-CM

## 2023-12-09 DIAGNOSIS — F418 Other specified anxiety disorders: Secondary | ICD-10-CM | POA: Diagnosis not present

## 2023-12-09 DIAGNOSIS — I10 Essential (primary) hypertension: Secondary | ICD-10-CM

## 2023-12-09 DIAGNOSIS — N812 Incomplete uterovaginal prolapse: Secondary | ICD-10-CM | POA: Diagnosis present

## 2023-12-09 DIAGNOSIS — Z8711 Personal history of peptic ulcer disease: Secondary | ICD-10-CM | POA: Insufficient documentation

## 2023-12-09 DIAGNOSIS — N393 Stress incontinence (female) (male): Secondary | ICD-10-CM | POA: Diagnosis not present

## 2023-12-09 DIAGNOSIS — N8301 Follicular cyst of right ovary: Secondary | ICD-10-CM | POA: Insufficient documentation

## 2023-12-09 DIAGNOSIS — Z01818 Encounter for other preprocedural examination: Secondary | ICD-10-CM

## 2023-12-09 DIAGNOSIS — N819 Female genital prolapse, unspecified: Secondary | ICD-10-CM | POA: Diagnosis not present

## 2023-12-09 DIAGNOSIS — K660 Peritoneal adhesions (postprocedural) (postinfection): Secondary | ICD-10-CM | POA: Insufficient documentation

## 2023-12-09 DIAGNOSIS — D649 Anemia, unspecified: Secondary | ICD-10-CM | POA: Diagnosis not present

## 2023-12-09 DIAGNOSIS — D259 Leiomyoma of uterus, unspecified: Secondary | ICD-10-CM | POA: Insufficient documentation

## 2023-12-09 DIAGNOSIS — K219 Gastro-esophageal reflux disease without esophagitis: Secondary | ICD-10-CM | POA: Insufficient documentation

## 2023-12-09 DIAGNOSIS — N838 Other noninflammatory disorders of ovary, fallopian tube and broad ligament: Secondary | ICD-10-CM | POA: Insufficient documentation

## 2023-12-09 DIAGNOSIS — F419 Anxiety disorder, unspecified: Secondary | ICD-10-CM | POA: Insufficient documentation

## 2023-12-09 HISTORY — PX: CYSTOSCOPY: SHX5120

## 2023-12-09 HISTORY — PX: XI ROBOTIC ASSISTED TOTAL HYSTERECTOMY WITH SACROCOLPOPEXY: SHX6825

## 2023-12-09 HISTORY — PX: BLADDER SUSPENSION: SHX72

## 2023-12-09 HISTORY — PX: ROBOTIC ASSISTED LAPAROSCOPIC OVARIAN CYSTECTOMY: SHX6081

## 2023-12-09 LAB — TYPE AND SCREEN
ABO/RH(D): A POS
Antibody Screen: NEGATIVE

## 2023-12-09 SURGERY — HYSTERECTOMY, TOTAL, ROBOT-ASSISTED, WITH SACROCOLPOPEXY
Anesthesia: General | Site: Vagina | Laterality: Right

## 2023-12-09 MED ORDER — PROPOFOL 1000 MG/100ML IV EMUL
INTRAVENOUS | Status: AC
  Filled 2023-12-09: qty 400

## 2023-12-09 MED ORDER — SIMETHICONE 80 MG PO CHEW
80.0000 mg | CHEWABLE_TABLET | Freq: Four times a day (QID) | ORAL | Status: DC | PRN
  Administered 2023-12-09 – 2023-12-10 (×2): 80 mg via ORAL

## 2023-12-09 MED ORDER — PROPOFOL 500 MG/50ML IV EMUL
INTRAVENOUS | Status: DC | PRN
  Administered 2023-12-09: 150 ug/kg/min via INTRAVENOUS

## 2023-12-09 MED ORDER — OXYCODONE HCL 5 MG/5ML PO SOLN: 5.0000 mg | Freq: Once | ORAL | Status: DC | PRN

## 2023-12-09 MED ORDER — METRONIDAZOLE 500 MG/100ML IV SOLN
INTRAVENOUS | Status: AC
  Filled 2023-12-09: qty 100

## 2023-12-09 MED ORDER — ONDANSETRON HCL 4 MG PO TABS: 4.0000 mg | ORAL_TABLET | Freq: Four times a day (QID) | ORAL | Status: DC | PRN

## 2023-12-09 MED ORDER — AMISULPRIDE (ANTIEMETIC) 5 MG/2ML IV SOLN
INTRAVENOUS | Status: AC
  Filled 2023-12-09: qty 4

## 2023-12-09 MED ORDER — BUPIVACAINE HCL (PF) 0.25 % IJ SOLN
INTRAMUSCULAR | Status: DC | PRN
  Administered 2023-12-09: 23 mL

## 2023-12-09 MED ORDER — LACTATED RINGERS IV SOLN: INTRAVENOUS | Status: DC

## 2023-12-09 MED ORDER — OXYCODONE HCL 5 MG PO TABS
ORAL_TABLET | ORAL | Status: AC
  Filled 2023-12-09: qty 2

## 2023-12-09 MED ORDER — SUGAMMADEX SODIUM 200 MG/2ML IV SOLN
INTRAVENOUS | Status: DC | PRN
  Administered 2023-12-09: 200 mg via INTRAVENOUS

## 2023-12-09 MED ORDER — ACETAMINOPHEN 500 MG PO TABS
ORAL_TABLET | ORAL | Status: AC
  Filled 2023-12-09: qty 2

## 2023-12-09 MED ORDER — GABAPENTIN 300 MG PO CAPS
300.0000 mg | ORAL_CAPSULE | ORAL | Status: AC
  Administered 2023-12-09: 300 mg via ORAL

## 2023-12-09 MED ORDER — ACETAMINOPHEN 500 MG PO TABS
1000.0000 mg | ORAL_TABLET | ORAL | Status: AC
  Administered 2023-12-09: 1000 mg via ORAL

## 2023-12-09 MED ORDER — POVIDONE-IODINE 10 % EX SWAB: 2.0000 | Freq: Once | CUTANEOUS | Status: DC

## 2023-12-09 MED ORDER — SCOPOLAMINE 1 MG/3DAYS TD PT72
MEDICATED_PATCH | TRANSDERMAL | Status: AC
  Filled 2023-12-09: qty 1

## 2023-12-09 MED ORDER — SIMETHICONE 80 MG PO CHEW
CHEWABLE_TABLET | ORAL | Status: AC
  Filled 2023-12-09: qty 1

## 2023-12-09 MED ORDER — KETAMINE HCL 50 MG/5ML IJ SOSY
PREFILLED_SYRINGE | INTRAMUSCULAR | Status: AC
  Filled 2023-12-09: qty 5

## 2023-12-09 MED ORDER — PROPOFOL 1000 MG/100ML IV EMUL
INTRAVENOUS | Status: AC
  Filled 2023-12-09: qty 200

## 2023-12-09 MED ORDER — STERILE WATER FOR IRRIGATION IR SOLN
Status: DC | PRN
  Administered 2023-12-09: 500 mL

## 2023-12-09 MED ORDER — OXYCODONE HCL 5 MG PO TABS
5.0000 mg | ORAL_TABLET | ORAL | Status: DC | PRN
Start: 2023-12-09 — End: 2023-12-10
  Administered 2023-12-09 – 2023-12-10 (×4): 10 mg via ORAL

## 2023-12-09 MED ORDER — DEXMEDETOMIDINE HCL IN NACL 80 MCG/20ML IV SOLN: INTRAVENOUS | Status: DC | PRN

## 2023-12-09 MED ORDER — METRONIDAZOLE 500 MG/100ML IV SOLN
500.0000 mg | INTRAVENOUS | Status: AC
  Administered 2023-12-09: 500 mg via INTRAVENOUS

## 2023-12-09 MED ORDER — KETOROLAC TROMETHAMINE 30 MG/ML IJ SOLN
INTRAMUSCULAR | Status: DC | PRN
  Administered 2023-12-09: 30 mg via INTRAVENOUS

## 2023-12-09 MED ORDER — CEFAZOLIN SODIUM-DEXTROSE 2-4 GM/100ML-% IV SOLN
2.0000 g | INTRAVENOUS | Status: AC
  Administered 2023-12-09: 2 g via INTRAVENOUS

## 2023-12-09 MED ORDER — AMISULPRIDE (ANTIEMETIC) 5 MG/2ML IV SOLN
10.0000 mg | Freq: Once | INTRAVENOUS | Status: AC
  Administered 2023-12-09: 10 mg via INTRAVENOUS

## 2023-12-09 MED ORDER — ONDANSETRON HCL 4 MG/2ML IJ SOLN
4.0000 mg | Freq: Once | INTRAMUSCULAR | Status: AC | PRN
  Administered 2023-12-09: 4 mg via INTRAVENOUS

## 2023-12-09 MED ORDER — FENTANYL CITRATE (PF) 250 MCG/5ML IJ SOLN
INTRAMUSCULAR | Status: AC
  Filled 2023-12-09: qty 5

## 2023-12-09 MED ORDER — DEXAMETHASONE SODIUM PHOSPHATE 10 MG/ML IJ SOLN
INTRAMUSCULAR | Status: DC | PRN
  Administered 2023-12-09: 10 mg via INTRAVENOUS

## 2023-12-09 MED ORDER — PROPOFOL 1000 MG/100ML IV EMUL
INTRAVENOUS | Status: AC
  Filled 2023-12-09: qty 100

## 2023-12-09 MED ORDER — ACETAMINOPHEN 325 MG PO TABS
650.0000 mg | ORAL_TABLET | ORAL | Status: DC | PRN
  Administered 2023-12-09 – 2023-12-10 (×3): 650 mg via ORAL

## 2023-12-09 MED ORDER — ROCURONIUM BROMIDE 10 MG/ML (PF) SYRINGE
PREFILLED_SYRINGE | INTRAVENOUS | Status: AC
  Filled 2023-12-09: qty 10

## 2023-12-09 MED ORDER — ONDANSETRON HCL 4 MG/2ML IJ SOLN
INTRAMUSCULAR | Status: AC
  Filled 2023-12-09: qty 2

## 2023-12-09 MED ORDER — OXYCODONE HCL 5 MG PO TABS: 5.0000 mg | ORAL_TABLET | Freq: Once | ORAL | Status: DC | PRN

## 2023-12-09 MED ORDER — SODIUM CHLORIDE 0.9 % IR SOLN
Status: DC | PRN
  Administered 2023-12-09: 1000 mL

## 2023-12-09 MED ORDER — SCOPOLAMINE 1 MG/3DAYS TD PT72
1.0000 | MEDICATED_PATCH | TRANSDERMAL | Status: DC
  Administered 2023-12-09: 1.5 mg via TRANSDERMAL

## 2023-12-09 MED ORDER — DEXMEDETOMIDINE HCL IN NACL 80 MCG/20ML IV SOLN
INTRAVENOUS | Status: AC
  Filled 2023-12-09: qty 20

## 2023-12-09 MED ORDER — PHENAZOPYRIDINE HCL 100 MG PO TABS
ORAL_TABLET | ORAL | Status: AC
  Filled 2023-12-09: qty 2

## 2023-12-09 MED ORDER — GABAPENTIN 300 MG PO CAPS
ORAL_CAPSULE | ORAL | Status: AC
  Filled 2023-12-09: qty 1

## 2023-12-09 MED ORDER — DEXAMETHASONE SODIUM PHOSPHATE 10 MG/ML IJ SOLN
INTRAMUSCULAR | Status: AC
  Filled 2023-12-09: qty 1

## 2023-12-09 MED ORDER — KETAMINE HCL 10 MG/ML IJ SOLN
INTRAMUSCULAR | Status: DC | PRN
  Administered 2023-12-09: 20 mg via INTRAVENOUS

## 2023-12-09 MED ORDER — LIDOCAINE 2% (20 MG/ML) 5 ML SYRINGE
INTRAMUSCULAR | Status: DC | PRN
  Administered 2023-12-09: 100 mg via INTRAVENOUS

## 2023-12-09 MED ORDER — DEXMEDETOMIDINE HCL IN NACL 80 MCG/20ML IV SOLN
INTRAVENOUS | Status: DC | PRN
  Administered 2023-12-09 (×3): 8 ug via INTRAVENOUS

## 2023-12-09 MED ORDER — FENTANYL CITRATE (PF) 100 MCG/2ML IJ SOLN
INTRAMUSCULAR | Status: DC | PRN
  Administered 2023-12-09 (×4): 50 ug via INTRAVENOUS

## 2023-12-09 MED ORDER — SODIUM CHLORIDE 0.9 % IV SOLN
12.5000 mg | Freq: Four times a day (QID) | INTRAVENOUS | Status: DC | PRN
  Administered 2023-12-09: 12.5 mg via INTRAVENOUS
  Filled 2023-12-09: qty 12.5

## 2023-12-09 MED ORDER — HYDROMORPHONE HCL 1 MG/ML IJ SOLN
0.2500 mg | INTRAMUSCULAR | Status: DC | PRN
  Administered 2023-12-09: 0.5 mg via INTRAVENOUS

## 2023-12-09 MED ORDER — ROCURONIUM BROMIDE 10 MG/ML (PF) SYRINGE
PREFILLED_SYRINGE | INTRAVENOUS | Status: DC | PRN
  Administered 2023-12-09: 20 mg via INTRAVENOUS
  Administered 2023-12-09: 10 mg via INTRAVENOUS
  Administered 2023-12-09 (×2): 20 mg via INTRAVENOUS
  Administered 2023-12-09: 70 mg via INTRAVENOUS

## 2023-12-09 MED ORDER — CEFAZOLIN SODIUM-DEXTROSE 2-4 GM/100ML-% IV SOLN
INTRAVENOUS | Status: AC
  Filled 2023-12-09: qty 100

## 2023-12-09 MED ORDER — ALBUMIN HUMAN 5 % IV SOLN: INTRAVENOUS | Status: DC | PRN

## 2023-12-09 MED ORDER — PHENAZOPYRIDINE HCL 100 MG PO TABS
200.0000 mg | ORAL_TABLET | ORAL | Status: AC
  Administered 2023-12-09: 200 mg via ORAL

## 2023-12-09 MED ORDER — ACETAMINOPHEN 325 MG PO TABS
ORAL_TABLET | ORAL | Status: AC
  Filled 2023-12-09: qty 2

## 2023-12-09 MED ORDER — LIDOCAINE-EPINEPHRINE 1 %-1:100000 IJ SOLN
INTRAMUSCULAR | Status: DC | PRN
  Administered 2023-12-09: 10 mL

## 2023-12-09 MED ORDER — MIDAZOLAM HCL 2 MG/2ML IJ SOLN
INTRAMUSCULAR | Status: AC
Start: 2023-12-09 — End: ?
  Filled 2023-12-09: qty 2

## 2023-12-09 MED ORDER — KETOROLAC TROMETHAMINE 30 MG/ML IJ SOLN
INTRAMUSCULAR | Status: AC
  Filled 2023-12-09: qty 1

## 2023-12-09 MED ORDER — PROPOFOL 10 MG/ML IV BOLUS
INTRAVENOUS | Status: DC | PRN
  Administered 2023-12-09: 250 mg via INTRAVENOUS

## 2023-12-09 MED ORDER — ONDANSETRON HCL 4 MG/2ML IJ SOLN: 4.0000 mg | Freq: Four times a day (QID) | INTRAMUSCULAR | Status: DC | PRN

## 2023-12-09 MED ORDER — LIDOCAINE HCL (PF) 2 % IJ SOLN
INTRAMUSCULAR | Status: AC
  Filled 2023-12-09: qty 5

## 2023-12-09 MED ORDER — ACETAMINOPHEN 10 MG/ML IV SOLN: 1000.0000 mg | Freq: Once | INTRAVENOUS | Status: DC | PRN

## 2023-12-09 MED ORDER — PROPOFOL 500 MG/50ML IV EMUL
INTRAVENOUS | Status: AC
  Filled 2023-12-09: qty 50

## 2023-12-09 MED ORDER — MIDAZOLAM HCL 5 MG/5ML IJ SOLN
INTRAMUSCULAR | Status: DC | PRN
  Administered 2023-12-09: 2 mg via INTRAVENOUS

## 2023-12-09 MED ORDER — ONDANSETRON HCL 4 MG/2ML IJ SOLN
INTRAMUSCULAR | Status: DC | PRN
  Administered 2023-12-09: 4 mg via INTRAVENOUS

## 2023-12-09 MED ORDER — HYDROMORPHONE HCL 1 MG/ML IJ SOLN
INTRAMUSCULAR | Status: AC
  Filled 2023-12-09: qty 1

## 2023-12-09 SURGICAL SUPPLY — 87 items
BAG URINE DRAIN 2000ML AR STRL (UROLOGICAL SUPPLIES) ×1 IMPLANT
BLADE CLIPPER SENSICLIP SURGIC (BLADE) ×6 IMPLANT
BLADE SURG 15 STRL LF DISP TIS (BLADE) ×6 IMPLANT
CATH FOLEY 3WAY 5CC 16FR (CATHETERS) ×6 IMPLANT
CHLORAPREP W/TINT 26 (MISCELLANEOUS) ×8 IMPLANT
COVER BACK TABLE 60X90IN (DRAPES) ×6 IMPLANT
COVER TIP SHEARS 8 DVNC (MISCELLANEOUS) ×6 IMPLANT
DEFOGGER SCOPE WARMER CLEARIFY (MISCELLANEOUS) ×6 IMPLANT
DERMABOND ADVANCED .7 DNX12 (GAUZE/BANDAGES/DRESSINGS) ×7 IMPLANT
DRAPE ARM DVNC X/XI (DISPOSABLE) ×24 IMPLANT
DRAPE COLUMN DVNC XI (DISPOSABLE) ×6 IMPLANT
DRAPE SHEET LG 3/4 BI-LAMINATE (DRAPES) ×6 IMPLANT
DRAPE SURG IRRIG POUCH 19X23 (DRAPES) ×6 IMPLANT
DRAPE UTILITY XL STRL (DRAPES) ×6 IMPLANT
DRIVER NDL LRG 8 DVNC XI (INSTRUMENTS) ×5 IMPLANT
DRIVER NDL MEGA SUTCUT DVNCXI (INSTRUMENTS) ×5 IMPLANT
DRIVER NDLE LRG 8 DVNC XI (INSTRUMENTS) ×5
DRIVER NDLE MEGA SUTCUT DVNCXI (INSTRUMENTS) ×5
ELECT REM PT RETURN 9FT ADLT (ELECTROSURGICAL) ×5
ELECTRODE REM PT RTRN 9FT ADLT (ELECTROSURGICAL) ×5 IMPLANT
FORCEPS BPLR 8 MD DVNC XI (FORCEP) ×6 IMPLANT
GAUZE 4X4 16PLY ~~LOC~~+RFID DBL (SPONGE) ×5 IMPLANT
GLOVE BIOGEL PI IND STRL 6.5 (GLOVE) ×24 IMPLANT
GLOVE BIOGEL PI IND STRL 7.0 (GLOVE) ×8 IMPLANT
GLOVE ECLIPSE 6.0 STRL STRAW (GLOVE) ×18 IMPLANT
GLOVE SURG SS PI 7.5 STRL IVOR (GLOVE) ×4 IMPLANT
GOWN STRL REUS W/TWL LRG LVL3 (GOWN DISPOSABLE) ×6 IMPLANT
GRASPER TIP-UP FEN DVNC XI (INSTRUMENTS) ×6 IMPLANT
HOLDER FOLEY CATH W/STRAP (MISCELLANEOUS) ×6 IMPLANT
IRRIG SUCT STRYKERFLOW 2 WTIP (MISCELLANEOUS) ×5
IRRIGATION SUCT STRKRFLW 2 WTP (MISCELLANEOUS) ×5 IMPLANT
IV NS 1000ML BAXH (IV SOLUTION) ×1 IMPLANT
KIT PINK PAD W/HEAD ARE REST (MISCELLANEOUS) ×5
KIT PINK PAD W/HEAD ARM REST (MISCELLANEOUS) ×5 IMPLANT
KIT TURNOVER CYSTO (KITS) ×6 IMPLANT
LEGGING LITHOTOMY PAIR STRL (DRAPES) ×6 IMPLANT
MANIFOLD NEPTUNE II (INSTRUMENTS) ×6 IMPLANT
MANIPULATOR ADVINCU DEL 2.5 PL (MISCELLANEOUS) IMPLANT
MANIPULATOR ADVINCU DEL 3.0 PL (MISCELLANEOUS) IMPLANT
MANIPULATOR ADVINCU DEL 3.5 PL (MISCELLANEOUS) ×1 IMPLANT
MANIPULATOR ADVINCU DEL 4.0 PL (MISCELLANEOUS) IMPLANT
MESH VERTESSA LITE -Y 2X4X3 (Mesh General) ×1 IMPLANT
NDL HYPO 22X1.5 SAFETY MO (MISCELLANEOUS) ×5 IMPLANT
NDL INSUFFLATION 14GA 120MM (NEEDLE) ×5 IMPLANT
NEEDLE HYPO 22X1.5 SAFETY MO (MISCELLANEOUS) ×5
NEEDLE INSUFFLATION 14GA 120MM (NEEDLE) ×5
NS IRRIG 1000ML POUR BTL (IV SOLUTION) ×5 IMPLANT
OBTURATOR OPTICAL STND 8 DVNC (TROCAR) ×5
OBTURATOR OPTICALSTD 8 DVNC (TROCAR) ×5 IMPLANT
PACK CYSTO (CUSTOM PROCEDURE TRAY) ×5 IMPLANT
PACK ROBOT WH (CUSTOM PROCEDURE TRAY) ×6 IMPLANT
PACK ROBOTIC GOWN (GOWN DISPOSABLE) ×6 IMPLANT
PACK VAGINAL WOMENS (CUSTOM PROCEDURE TRAY) ×5 IMPLANT
PAD OB MATERNITY 4.3X12.25 (PERSONAL CARE ITEMS) ×6 IMPLANT
PAD PREP 24X48 CUFFED NSTRL (MISCELLANEOUS) ×6 IMPLANT
POUCH LAPAROSCOPIC INSTRUMENT (MISCELLANEOUS) IMPLANT
PROTECTOR NERVE ULNAR (MISCELLANEOUS) ×6 IMPLANT
RETRACTOR LONE STAR DISPOSABLE (INSTRUMENTS) ×5 IMPLANT
RETRACTOR STAY HOOK 5MM (MISCELLANEOUS) ×5 IMPLANT
SCISSORS MNPLR CVD DVNC XI (INSTRUMENTS) ×6 IMPLANT
SCRUB CHG 4% DYNA-HEX 4OZ (MISCELLANEOUS) ×7 IMPLANT
SEAL UNIV 5-12 XI (MISCELLANEOUS) ×30 IMPLANT
SEALER VESSEL EXT DVNC XI (MISCELLANEOUS) ×1 IMPLANT
SET IRRIG Y TYPE TUR BLADDER L (SET/KITS/TRAYS/PACK) ×6 IMPLANT
SET TUBE SMOKE EVAC HIGH FLOW (TUBING) ×6 IMPLANT
SLEEVE SCD COMPRESS KNEE MED (STOCKING) ×6 IMPLANT
SLING ADVANTAGE FIT TRANVAG (Sling) ×1 IMPLANT
SPIKE FLUID TRANSFER (MISCELLANEOUS) ×10 IMPLANT
SUCTION TUBE FRAZIER 10FR DISP (SUCTIONS) ×6 IMPLANT
SURGIFLO W/THROMBIN 8M KIT (HEMOSTASIS) IMPLANT
SUT ABS MONO DBL WITH NDL 48IN (SUTURE) IMPLANT
SUT GORETEX NAB #0 THX26 36IN (SUTURE) ×7 IMPLANT
SUT MNCRL AB 4-0 PS2 18 (SUTURE) ×12 IMPLANT
SUT MON AB 2-0 SH 27 (SUTURE) ×6 IMPLANT
SUT V-LOC BARB 180 2/0GR9 GS23 (SUTURE) ×10
SUT VIC AB 0 CT1 27XBRD ANTBC (SUTURE) ×12 IMPLANT
SUT VIC AB 0 CT1 36 (SUTURE) IMPLANT
SUT VIC AB 2-0 SH 27XBRD (SUTURE) ×6 IMPLANT
SUT VIC AB 3-0 SH 18 (SUTURE) IMPLANT
SUT VICRYL 2-0 SH 8X27 (SUTURE) IMPLANT
SUT VLOC 180 0 9IN GS21 (SUTURE) ×6 IMPLANT
SUT VLOC 180 2-0 9IN GS21 (SUTURE) IMPLANT
SUTURE V-LC BRB 180 2/0GR9GS23 (SUTURE) ×10 IMPLANT
SYR BULB EAR ULCER 3OZ GRN STR (SYRINGE) ×6 IMPLANT
TOWEL OR 17X24 6PK STRL BLUE (TOWEL DISPOSABLE) ×6 IMPLANT
TRAY FOLEY W/BAG SLVR 14FR LF (SET/KITS/TRAYS/PACK) ×5 IMPLANT
WATER STERILE IRR 500ML POUR (IV SOLUTION) ×1 IMPLANT

## 2023-12-09 NOTE — Anesthesia Procedure Notes (Addendum)
 Procedure Name: Intubation Date/Time: 12/09/2023 11:50 AM  Performed by: Junius Olive, CRNAPre-anesthesia Checklist: Patient identified, Emergency Drugs available, Suction available and Patient being monitored Patient Re-evaluated:Patient Re-evaluated prior to induction Oxygen Delivery Method: Circle system utilized Preoxygenation: Pre-oxygenation with 100% oxygen Induction Type: IV induction Ventilation: Mask ventilation without difficulty Laryngoscope Size: Mac and 3 Grade View: Grade I Tube type: Oral Tube size: 7.0 mm Number of attempts: 1 Airway Equipment and Method: Stylet Placement Confirmation: ETT inserted through vocal cords under direct vision, positive ETCO2 and breath sounds checked- equal and bilateral Secured at: 19 cm Tube secured with: Tape Dental Injury: Teeth and Oropharynx as per pre-operative assessment

## 2023-12-09 NOTE — H&P (Signed)
 [facility] Pre Op H&P   Subjective Chief Complaint: [name] presents for a preoperative encounter.    History of Present Illness: [name] is a 48 y.o. female who presents for preoperative visit.  She is scheduled to undergo Robotic assisted total laparoscopic hysterectomy with bilateral salpingectomy, sacrocolpopexy, perineoplasty, midurethral sling, cystoscopy, possible vaginal procedure if robotic not possible on 12/09/23.  Her symptoms include vaginal bulge and incontinence, and she was was found to have Stage II anterior, Stage II posterior, Stage I apical prolapse.    Urodynamic Impression:  1. Sensation was reduced; capacity was normal 2. Stress Incontinence was demonstrated at normal pressures; 3. Detrusor Overactivity was demonstrated without leakage. 4. Emptying was dysfunctional with a normal PVR, a sustained detrusor contraction present,  abdominal straining present, dyssynergic urethral sphincter activity on EMG.       Past Medical History:  Diagnosis Date   ADHD (attention deficit hyperactivity disorder)     Anemia     Depression     GERD (gastroesophageal reflux disease)     Headache(784.0)     Hypertension     IBS (irritable bowel syndrome)     Incisional hernia     SVD (spontaneous vaginal delivery) 10/17/2011   Ulcer                 Past Surgical History:  Procedure Laterality Date   BOWEL RESECTION   06/05/2022    due to intususception   CHOLECYSTECTOMY   11/24/2004   ESOPHAGOGASTRODUODENOSCOPY   11/28/2011    Procedure: ESOPHAGOGASTRODUODENOSCOPY (EGD);  Surgeon: Almeda Aris, MD;  Location: [facility];  Service: Endoscopy;  Laterality: N/A;   lap repair gastrojejunal anastomotic perforation   02/21/2011    Dr Elvan Hamel   LAPAROSCOPIC GASTROTOMY W/ REPAIR OF ULCER   10/23/2009   ROUX-EN-Y GASTRIC BYPASS   11/25/2003   SMALL BOWEL REPAIR   06/05/2022   VENTRAL HERNIA REPAIR   12/22/2011    Procedure: HERNIA REPAIR  VENTRAL ADULT;  Surgeon: Azucena Bollard, MD;  Location: [facility];  Service: General;;          is allergic to nsaids.         Family History  Problem Relation Age of Onset   CVA Mother     Heart disease Mother          fibroelastoma on her heart, CAD, CABG   Cerebral aneurysm Mother     COPD Father     Liver cancer Father     Alcohol abuse Father     Drug abuse Brother     Alcohol abuse Brother     Drug abuse Brother     Alcohol abuse Brother     Heart disease Maternal Grandmother     Aneurysm Maternal Grandfather     Diabetes Paternal Grandmother     Drug abuse Son          in recovery   Asthma Son     Depression Son     ADD / ADHD Son     Anxiety disorder Son     Tourette syndrome Son     Anesthesia problems Neg Hx     Hypotension Neg Hx     Malignant hyperthermia Neg Hx     Pseudochol deficiency Neg Hx     Stroke Neg Hx            Social History  Social History  Tobacco Use   Smoking status: Never      Passive exposure: Past   Smokeless tobacco: Never  Vaping Use   Vaping status: Never Used  Substance Use Topics   Alcohol use: No   Drug use: No          Review of Systems was negative for a full 10 system review except as noted in the History of Present Illness.    Current Medications  No current facility-administered medications for this encounter.   Current Outpatient Medications:    acetaminophen  (TYLENOL ) 500 MG tablet, Take 1 tablet (500 mg total) by mouth every 6 (six) hours as needed (pain)., Disp: 30 tablet, Rfl: 0   amphetamine -dextroamphetamine  (ADDERALL) 20 MG tablet, Take 1 tablet (20 mg total) by mouth 2 (two) times daily. do not fill for 60 days from 07/08/2023, Disp: 60 tablet, Rfl: 0   busPIRone  (BUSPAR ) 30 MG tablet, Take 1 tablet (30 mg total) by mouth 2 (two) times daily with food., Disp: 60 tablet, Rfl: 3   Calcium Carb-Cholecalciferol (CHEWABLE CALCIUM/D3 PO), , Disp: , Rfl:    cyanocobalamin  500 MCG tablet, Take 500  mcg by mouth daily., Disp: , Rfl:    DULoxetine  (CYMBALTA ) 30 MG capsule, Take 3 capsules (90 mg total) by mouth daily., Disp: 90 capsule, Rfl: 3   esomeprazole  (NEXIUM ) 40 MG capsule, Take 1 capsule (40 mg total) by mouth daily., Disp: 90 capsule, Rfl: 4   famotidine  (PEPCID ) 40 MG tablet, Take 0.5 tablets (20 mg total) by mouth 2 (two) times daily., Disp: 60 tablet, Rfl: 5   Iron -FA-B Cmp-C-Biot-Probiotic (FUSION PLUS) CAPS, , Disp: , Rfl:    LORazepam  (ATIVAN ) 1 MG tablet, Take 1 tablet (1 mg total) by mouth every 12 (twelve) hours as needed for severe anxiety., Disp: 60 tablet, Rfl: 3   LORazepam  (ATIVAN ) 1 MG tablet, Take 1 tablet (1 mg total) by mouth every 12 (twelve) hours as needed for anxiety., Disp: 60 tablet, Rfl: 3   methocarbamol  (ROBAXIN -750) 750 MG tablet, Take 1 tablet (750 mg total) by mouth every 6 (six) hours as needed for muscle spasms., Disp: 30 tablet, Rfl: 0   oxyCODONE  (OXY IR/ROXICODONE ) 5 MG immediate release tablet, Take 1 tablet (5 mg total) by mouth every 4 (four) hours as needed for severe pain (pain score 7-10)., Disp: 15 tablet, Rfl: 0   polyethylene glycol powder (GLYCOLAX /MIRALAX ) 17 GM/SCOOP powder, Take 17 g by mouth daily. Drink 17g (1 scoop) dissolved in water  per day., Disp: 238 g, Rfl: 0   pramipexole  (MIRAPEX ) 1.5 MG tablet, Take 1 tablet (1.5 mg total) by mouth at bedtime., Disp: 30 tablet, Rfl: 3   rizatriptan  (MAXALT ) 10 MG tablet, Take 1 tablet (10 mg total) by mouth as needed for migraine. May repeat in 2 hours if needed, Disp: 10 tablet, Rfl: 3   traZODone  (DESYREL ) 100 MG tablet, Take 1-2 tablets (100-200 mg total) by mouth at bedtime as needed for insomnia., Disp: 60 tablet, Rfl: 2      Objective There were no vitals filed for this visit.     Gen: NAD CV: S1 S2 RRR Lungs: Clear to auscultation bilaterally Abd: soft, nontender     Previous Pelvic Exam showed: POP-Q (08/05/23)   -0.5  Aa   -0.5                                            Ba   -4.5                                              C    4                                            Gh   3.5                                            Pb   8                                            tvl    -1.5                                            Ap   -1.5                                            Bp   -7.5                                              D            Assessment/ Plan   The patient is a 48 y.o. year old scheduled to undergo Robotic assisted total laparoscopic hysterectomy with bilateral salpingectomy, sacrocolpopexy, perineoplasty, midurethral sling, cystoscopy, possible vaginal procedure if robotic not possible     Arma Lamp, MD

## 2023-12-09 NOTE — Op Note (Addendum)
 Operative Note  Preoperative Diagnosis: anterior vaginal prolapse, posterior vaginal prolapse, uterovaginal prolapse, incomplete, and stress urinary incontinence  Postoperative Diagnosis: same  Procedures performed:  Robotic assisted total laparoscopic hysterectomy with bilateral salpingectomy, right ovarian cystectomy, Sacrocolpopexy Cleopatra Dada Lite Y), midurethral sling (Advantage Fit), cystoscopy  Implants:  Implant Name Type Inv. Item Serial No. Manufacturer Lot No. LRB No. Used Action  MESH Terrea Ferrier [id number] Mesh General MESH Valli Gaw  [facility] [id number] N/A 1 Implanted  [place] [employer] [place] - [id number] Sling SLING ADVANTAGE FIT Burtis Case [id number] N/A 1 Implanted    Attending Surgeon: Ollen Beverage, MD  Assistant: Santana Cue, RNFA  Anesthesia: General endotracheal  Findings: 1. On vaginal exam, stage II prolapse present  2. On cystoscopy, normal bladder and urethral mucosa without injury or lesion. Brisk bilateral ureteral efflux present.    3. On laparoscopy, normal appearing uterus and fallopian tubes. 4cm simple cyst on right ovary, normal left ovary with small cyst. Adhesion of a loop of omentum to the cecum, which was removed.   Specimens:  ID Type Source Tests Collected by Time Destination  1 : cervix, uterus, bilateral fallopian tubes, right ovarian cyst wall Tissue PATH Gyn biopsy SURGICAL PATHOLOGY Arma Lamp, MD 12/09/2023 1326     Estimated blood loss: 75 mL  IV fluids: 1400 mL  Urine output: 150 mL  Complications: none  Procedure in Detail:  After informed consent was obtained, the patient was taken to the operating room, where general anesthesia was induced and found to be adequate. She was placed in dorsolithotomy position in yellowfin stirrups. Her hips were noted not to be hyperflexed or hyperextended. Her arms were padded with gel pads and tucked to her sides. Her hands were  surrounded by foam. A padded strap was placed across her chest with foam between the pad and her skin. She was noted to be appropriately positioned with all pressure points well padded and off tension. A tilt test showed no slippage. She was prepped and draped in the usual sterile fashion. A uterine manipulator was placed in the uterus after sounding to 8 cm, an appropriately sized Koh ring was placed around the cervix, and a pneumo-occluder balloon was positioned in the vagina for later use.  A sterile Foley catheter was inserted.   0.25% plain Marcaine  was injected at Palmer's point and an incision was made with a scalpel. A Veress needle was inserted into the incision, CO2 insufflation was started, a low opening pressure was noted, and pneumoperitoneum was obtained. The Veress needle was removed and a 5mm trocar was placed with direct visualization with the camera. No significant adhesions were noted so this was changed to an 8mm robotic trocar.  The sacrum appeared to be free of any adhesive disease. After determining placement for the other ports, local anesthetic was injected at each site and one 8 mm incisions were made for robotic ports at 10 cm lateral to the right and at the level of the umbilical port. Two additional 8 mm incisions were made 10 cm lateral to the other ports and 30 degrees down followed by 8 mm robotic ports - the right side for an assistant port. All trocars were placed sequentially under direct visualization of the camera. The patient was placed in Trendelenburg. The robot was docked on the patient's right side. Monopolar endoshears alternating with a vessel sealer was placed in the right arm, a [place]  bipolar grasper was placed in the 2nd  arm of the patient's left side, and a Tip up grasper was placed in the 3rd arm on the patient's left side.   A right ovarian cyst was noted on the right, approximately 4cm. The ovary was grasped and the cyst was incised. The cyst wall was removed  and cautery was used to obtain hemostasis on the cyst bed. Attention was then turned to the sacral promontory. The peritoneum overlying the sacral promontory was tented up, dissected sharply with monopolar scissors and electrosurgery using layer by layer technique. The peritoneal incision was extended down to the posterior cul-de-sac. This was performed with care to avoid the ureter on the right side and the sigmoid colon and its mesentary on the left side. Two transverse sutures of CV2 Gortex were placed in the anterior longitudinal ligament.  Attention was then turned to the robotic hysterectomy. The ureter was identified and was found to be well away from the planned site of incision. The mesosalpinx was cauterized and cut. The right uteroovarian ligament was then cauterized and transected.  The right round ligament was grasped, cauterized, and transected with electrocautery. The anterior and posterior leaves of the broad ligament were taken down with cautery and sharp dissection. The uterine artery was skeletonized and the bladder flap was created on the right side with a combination of electrosurgery and sharp dissection. The KOH ring was identified. The right uterine artery was clamped, cauterized, and transected. In a similar fashion, the left side was taken down. Further sharp dissection with combination of cautery was performed to further develop the bladder flap. At this point, the KOH ring was completely hugging the cervix. The pneumo-occluder balloon in the vagina was inflated to maintain pneumoperitoneum. A colpotomy was performed with electrosurgical cutting current and the uterus and cervix were completely amputated from the vagina. The specimen was delivered through the vagina. The posterior portion of the vaginal cuff was then grasped and pulled up to maintain pneumoperitoneum. The pneumo-occluder balloon was then replaced in the vagina. The right hand instrument was changed to a suture-cut needle  driver. The vaginal cuff was then closed using a 0 V-loc suture in two layers.    The right hand instrument was replaced with monopolar endoshears. With a lucite probe in the vagina, the anterior vaginal dissection was then performed with sharp dissection and electrosurgery. The posterior vaginal dissection was then performed with sharp dissection and electrosurgery in order to dissect the rectum away from the posterior vagina.  A "Y" mesh was then inserted into the abdomen after trimming to appropriate size. With the probe in the vagina, the anterior leaf of the Y mesh was affixed to the anterior portion of the vagina using a 2-0 v-loc suture in a spiral pattern to distribute the suture evenly across the surface of the anterior mesh leaf. In a similar fashion, the posterior leaf of the Y mesh was attached to the posterior surface of the vagina with 2-0 v-loc suture.  The distal end of the mesh was then brought to overlie the sacrum. The correct amount of tension was determined in order to elevate the vagina, but not put the mesh under tension. The distal end of the mesh was then affixed to the anterior longitudinal sacral ligament using two interrupted transverse stitches of CV2 Gortex. The excess distal mesh was then cut and removed. The peritoneum was reapproximated over the mesh using 2-0 monocryl. The bladder flap was incorporated to completely retroperitonealize the mesh. A small loop of omentum was noted adhesed to  the cecum, and this was transected with monopolar scissors. All pedicles were carefully inspected and noted to be hemostatic as the CO2 gas was deflated. All instruments were removed from the patient's abdomen.   The Foley catheter was removed.  A 70-degree cystoscope was introduced, and 360-degree inspection revealed no injury, lesion or foreign body in the bladder. Brisk bilateral ureteral efflux was noted with the assistance of pyridium .  The bladder was drained and the cystoscope was  removed.  The Foley catheter was replaced.  The robot was undocked. The CO2 gas was removed and the ports were removed.  The skin incisions were closed with subcutaneous stitches of 4-0 Monocryl and covered with skin glue.  The sling was performed next.  A lonestar self-retraining retractor was placed with 4 stay hooks. The mid urethral area was located on the anterior vaginal wall.  Two Allis clamps were placed at the level of the midurethra. 1% lidocaine  with epinephrine  was injected into the vaginal mucosa. A vertical incision was made between the two clamps using a 15-blade scalpel.  Using sharp dissection, Metzenbaum scissors were used to make a periurethral tunnel from the vaginal incision towards the pubic rami bilaterally for the future sling tracts. The bladder was ensured to be empty. The trocar and attached sling were introduced into the right side of the periurethral vaginal incision, just inferior to the pubic symphysis on the right side. The trocar was guided through the endopelvic fascia and directly vertically.  While hugging the cephalad surface of the pubic bone, the trocar was guided out through the abdomen 2 fingerbreadths lateral to midline at the level of the pubic symphysis on the ipsilateral side. The trocar was placed on the left side in a similar fashion.  A 70-degree cystoscope was introduced, and 360-degree inspection revealed no trauma or trocars in the bladder, with brisk bilateral ureteral efflux.  The bladder was drained and the cystoscope was removed.  The Foley catheter was reinserted.  The sling was brought to lie beneath the mid-urethra.  A needle driver was placed behind the sling to ensure no tension.   The plastic sheath was removed from the sling and the distal ends of the sling were trimmed just below the level of the skin incisions.  Tension-free positioning of the sling was confirmed. Vaginal inspection revealed no vaginotomy or sling perforations of the mucosa.  The  vaginal mucosal edges were reapproximated using 2-0 Vicryl.  The vagina was copiously irrigated.  Hemostasis was again noted. Vaginal packing not placed. The suprapubic sling incisions were closed with Dermabond. The patient tolerated the procedure well.  She was awakened from anesthesia and transferred to the recovery room in stable condition. All needle and sponge counts were correct x 2.    Arma Lamp, MD

## 2023-12-09 NOTE — Transfer of Care (Signed)
 Immediate Anesthesia Transfer of Care Note  Patient: [name]  Procedure(s) Performed: XI ROBOTIC ASSISTED TOTAL HYSTERECTOMY WITH BILATERAL SALPINGECTOMY AND SACROCOLPOPEXY (Pelvis) TRANSVAGINAL TAPE (TVT) PROCEDURE (Vagina ) CYSTOSCOPY (Bladder) XI ROBOTIC ASSISTED LAPAROSCOPIC OVARIAN CYSTECTOMY (Right)  Patient Location: PACU  Anesthesia Type:General  Level of Consciousness: unresponsive  Airway & Oxygen Therapy: o2 face mask with OPA in place  Post-op Assessment: Report given to RN and Post -op Vital signs reviewed and stable  Post vital signs: Reviewed and stable  Last Vitals:  Vitals Value Taken Time  BP 149/96 12/09/23 1616  Temp    Pulse 77 12/09/23 1624  Resp 21 12/09/23 1624  SpO2 100 % 12/09/23 1624  Vitals shown include unfiled device data.  Last Pain:  Vitals:   12/09/23 1049  TempSrc: Oral  PainSc: 0-No pain      Patients Stated Pain Goal: 5 (12/09/23 1049)  Complications: No notable events documented.

## 2023-12-09 NOTE — Discharge Instructions (Addendum)

## 2023-12-10 DIAGNOSIS — Z8711 Personal history of peptic ulcer disease: Secondary | ICD-10-CM | POA: Diagnosis not present

## 2023-12-10 DIAGNOSIS — K660 Peritoneal adhesions (postprocedural) (postinfection): Secondary | ICD-10-CM | POA: Diagnosis not present

## 2023-12-10 DIAGNOSIS — N8301 Follicular cyst of right ovary: Secondary | ICD-10-CM | POA: Diagnosis not present

## 2023-12-10 DIAGNOSIS — I10 Essential (primary) hypertension: Secondary | ICD-10-CM | POA: Diagnosis not present

## 2023-12-10 DIAGNOSIS — N393 Stress incontinence (female) (male): Secondary | ICD-10-CM | POA: Diagnosis not present

## 2023-12-10 DIAGNOSIS — D259 Leiomyoma of uterus, unspecified: Secondary | ICD-10-CM | POA: Diagnosis not present

## 2023-12-10 DIAGNOSIS — K219 Gastro-esophageal reflux disease without esophagitis: Secondary | ICD-10-CM | POA: Diagnosis not present

## 2023-12-10 DIAGNOSIS — N812 Incomplete uterovaginal prolapse: Secondary | ICD-10-CM | POA: Diagnosis not present

## 2023-12-10 DIAGNOSIS — N838 Other noninflammatory disorders of ovary, fallopian tube and broad ligament: Secondary | ICD-10-CM | POA: Diagnosis not present

## 2023-12-10 DIAGNOSIS — F32A Depression, unspecified: Secondary | ICD-10-CM | POA: Diagnosis not present

## 2023-12-10 MED ORDER — SIMETHICONE 80 MG PO CHEW
CHEWABLE_TABLET | ORAL | Status: AC
  Filled 2023-12-10: qty 1

## 2023-12-10 MED ORDER — ACETAMINOPHEN 325 MG PO TABS
ORAL_TABLET | ORAL | Status: AC
Start: 2023-12-10 — End: ?
  Filled 2023-12-10: qty 2

## 2023-12-10 MED ORDER — OXYCODONE HCL 5 MG PO TABS
ORAL_TABLET | ORAL | Status: AC
  Filled 2023-12-10: qty 2

## 2023-12-10 MED ORDER — ACETAMINOPHEN 325 MG PO TABS
ORAL_TABLET | ORAL | Status: AC
  Filled 2023-12-10: qty 2

## 2023-12-10 NOTE — Discharge Summary (Signed)
Physician Discharge Summary  Patient ID: [name] MRN: [id number] DOB/AGE: [birth year] 48 y.o.  Admit date: 12/09/2023 Discharge date: 12/10/2023  Admission Diagnoses:  Discharge Diagnoses:  Principal Problem:   Uterovaginal prolapse, incomplete   Discharged Condition: good  Hospital Course: 47yo F presenting for scheduled surgery: Robotic assisted total laparoscopic hysterectomy with bilateral salpingectomy, sacrocolpopexy, midurethral sling, cystoscopy. Post operatively she developed nausea. On POD#1, she was tolerating regular diet and pain was controlled. She was ambulating well and passed her voiding trial. She was deemed stable for discharge.   Consults: None  Significant Diagnostic Studies: n/a  Treatments: surgery: see above  Discharge Exam: Blood pressure 117/79, pulse 76, temperature 97.7 F (36.5 C), resp. rate 18, height 5\' 7"  (1.702 m), weight 104.8 kg, last menstrual period 11/25/2023, SpO2 96%. General appearance: alert and cooperative GI: soft, non-tender; bowel sounds normal; no masses,  no organomegaly. Laparoscopic incisions clean and dry Extremities: extremities normal, atraumatic, no cyanosis or edema  Disposition: Discharge disposition: 01-Home or Self Care       Discharge Instructions     Call MD for:  persistant nausea and vomiting   Complete by: As directed    Call MD for:  persistant nausea and vomiting   Complete by: As directed    Call MD for:  redness, tenderness, or signs of infection (pain, swelling, redness, odor or green/yellow discharge around incision site)   Complete by: As directed    Call MD for:  redness, tenderness, or signs of infection (pain, swelling, redness, odor or green/yellow discharge around incision site)   Complete by: As directed    Call MD for:  severe uncontrolled pain   Complete by: As directed    Call MD for:  severe uncontrolled pain   Complete by: As directed    Call MD for:  temperature >100.4    Complete by: As directed    Call MD for:  temperature >100.4   Complete by: As directed    Diet general   Complete by: As directed    Diet general   Complete by: As directed    Increase activity slowly   Complete by: As directed    Increase activity slowly   Complete by: As directed    May walk up steps   Complete by: As directed    May walk up steps   Complete by: As directed       Allergies as of 12/10/2023       Reactions   Nsaids Other (See Comments)   Avoids due to history gastric ulcers        Medication List     TAKE these medications    amphetamine-dextroamphetamine 20 MG tablet Commonly known as: Adderall Take 1 tablet (20 mg total) by mouth 2 (two) times daily.   busPIRone 30 MG tablet Commonly known as: BUSPAR Take 1 tablet (30 mg total) by mouth 2 (two) times daily with food.   CHEWABLE CALCIUM/D3 PO Take by mouth 2 (two) times daily. One chew twice daily   CHEWABLES MULTIVITAMIN PO Take by mouth daily. One chew daily   clotrimazole-betamethasone cream Commonly known as: LOTRISONE Apply 1 Application topically daily.   cyanocobalamin 500 MCG tablet Commonly known as: VITAMIN B12 Take 500 mcg by mouth daily.   DULoxetine 30 MG capsule Commonly known as: CYMBALTA Take 3 capsules (90 mg total) by mouth daily.   esomeprazole 40 MG capsule Commonly known as: NEXIUM Take 1 capsule (40 mg total) by mouth daily.  famotidine 40 MG tablet Commonly known as: PEPCID Take 0.5 tablets (20 mg total) by mouth 2 (two) times daily.   Fusion Plus Caps Take 1 capsule by mouth daily.   LORazepam 1 MG tablet Commonly known as: ATIVAN Take 1 tablet (1 mg total) by mouth every 12 (twelve) hours as needed for anxiety.   methocarbamol 750 MG tablet Commonly known as: Robaxin-750 Take 1 tablet (750 mg total) by mouth every 6 (six) hours as needed for muscle spasms.   oxyCODONE 5 MG immediate release tablet Commonly known as: Oxy IR/ROXICODONE Take 1  tablet (5 mg total) by mouth every 4 (four) hours as needed for severe pain (pain score 7-10). Notes to patient: May take next dose at 12 noon   polyethylene glycol powder 17 GM/SCOOP powder Commonly known as: GLYCOLAX/MIRALAX Take 17 g by mouth daily. Drink 17g (1 scoop) dissolved in water per day.   pramipexole 1.5 MG tablet Commonly known as: MIRAPEX Take 1 tablet (1.5 mg total) by mouth at bedtime.   pramipexole 1.5 MG tablet Commonly known as: MIRAPEX Take 1 tablet (1.5 mg total) by mouth at bedtime.   rizatriptan 10 MG tablet Commonly known as: Maxalt Take 1 tablet (10 mg total) by mouth as needed for migraine. May repeat in 2 hours if needed   traZODone 100 MG tablet Commonly known as: DESYREL Take 1-2 tablets (100-200 mg total) by mouth at bedtime as needed for insomnia.         Signed: Marguerita Beards 12/10/2023, 9:00 AM

## 2023-12-10 NOTE — Anesthesia Postprocedure Evaluation (Signed)
Anesthesia Post Note  Patient: [name]  Procedure(s) Performed: XI ROBOTIC ASSISTED TOTAL HYSTERECTOMY WITH BILATERAL SALPINGECTOMY AND SACROCOLPOPEXY (Pelvis) TRANSVAGINAL TAPE (TVT) PROCEDURE (Vagina ) CYSTOSCOPY (Bladder) XI ROBOTIC ASSISTED LAPAROSCOPIC OVARIAN CYSTECTOMY (Right)     Patient location during evaluation: PACU Anesthesia Type: General Level of consciousness: awake and alert Pain management: pain level controlled Vital Signs Assessment: post-procedure vital signs reviewed and stable Respiratory status: spontaneous breathing, nonlabored ventilation, respiratory function stable and patient connected to nasal cannula oxygen Cardiovascular status: stable Postop Assessment: no apparent nausea or vomiting Anesthetic complications: no   No notable events documented.  Last Vitals:  Vitals:   12/10/23 0640 12/10/23 0854  BP: 117/67 117/79  Pulse: 74 76  Resp: 18 18  Temp: 37.3 C 36.5 C  SpO2: 99% 96%    Last Pain:  Vitals:   12/10/23 0852  TempSrc:   PainSc: 2    Pain Goal: Patients Stated Pain Goal: 4 (12/10/23 0640)                 Beryle Lathe

## 2023-12-11 ENCOUNTER — Encounter (HOSPITAL_BASED_OUTPATIENT_CLINIC_OR_DEPARTMENT_OTHER): Payer: Self-pay | Admitting: Obstetrics and Gynecology

## 2023-12-11 ENCOUNTER — Other Ambulatory Visit (HOSPITAL_BASED_OUTPATIENT_CLINIC_OR_DEPARTMENT_OTHER): Payer: Self-pay

## 2023-12-11 ENCOUNTER — Other Ambulatory Visit: Payer: Self-pay

## 2023-12-11 DIAGNOSIS — F909 Attention-deficit hyperactivity disorder, unspecified type: Secondary | ICD-10-CM | POA: Diagnosis not present

## 2023-12-11 DIAGNOSIS — F339 Major depressive disorder, recurrent, unspecified: Secondary | ICD-10-CM | POA: Diagnosis not present

## 2023-12-11 DIAGNOSIS — F411 Generalized anxiety disorder: Secondary | ICD-10-CM | POA: Diagnosis not present

## 2023-12-11 LAB — SURGICAL PATHOLOGY

## 2023-12-11 MED ORDER — PRAMIPEXOLE DIHYDROCHLORIDE 1.5 MG PO TABS
1.5000 mg | ORAL_TABLET | Freq: Every day | ORAL | 3 refills | Status: DC
  Filled 2023-12-11 – 2023-12-25 (×3): qty 30, 30d supply, fill #0
  Filled 2024-04-18: qty 30, 30d supply, fill #1
  Filled 2024-05-19: qty 30, 30d supply, fill #2

## 2023-12-11 MED ORDER — BUSPIRONE HCL 30 MG PO TABS
30.0000 mg | ORAL_TABLET | Freq: Two times a day (BID) | ORAL | 3 refills | Status: DC
  Filled 2023-12-11: qty 60, 30d supply, fill #0
  Filled 2023-12-24 – 2024-01-04 (×2): qty 60, 30d supply, fill #1
  Filled 2024-02-08: qty 60, 30d supply, fill #2
  Filled 2024-03-05 (×2): qty 60, 30d supply, fill #3

## 2023-12-11 MED ORDER — TRAZODONE HCL 100 MG PO TABS
100.0000 mg | ORAL_TABLET | Freq: Every evening | ORAL | 2 refills | Status: AC | PRN
  Filled 2023-12-11 – 2024-01-04 (×2): qty 60, 30d supply, fill #0
  Filled 2024-01-31: qty 60, 30d supply, fill #1
  Filled 2024-03-04 – 2024-03-05 (×2): qty 60, 30d supply, fill #2

## 2023-12-11 MED ORDER — DULOXETINE HCL 30 MG PO CPEP
90.0000 mg | ORAL_CAPSULE | Freq: Every day | ORAL | 3 refills | Status: AC
  Filled 2023-12-24: qty 90, 30d supply, fill #0
  Filled 2024-01-19: qty 90, 30d supply, fill #1
  Filled 2024-02-21: qty 90, 30d supply, fill #2
  Filled 2024-03-22: qty 90, 30d supply, fill #3

## 2023-12-14 ENCOUNTER — Encounter: Payer: Self-pay | Admitting: Podiatry

## 2023-12-15 ENCOUNTER — Other Ambulatory Visit (HOSPITAL_BASED_OUTPATIENT_CLINIC_OR_DEPARTMENT_OTHER): Payer: Self-pay

## 2023-12-16 DIAGNOSIS — H524 Presbyopia: Secondary | ICD-10-CM | POA: Diagnosis not present

## 2023-12-21 ENCOUNTER — Other Ambulatory Visit (HOSPITAL_BASED_OUTPATIENT_CLINIC_OR_DEPARTMENT_OTHER): Payer: Self-pay

## 2023-12-21 DIAGNOSIS — F4323 Adjustment disorder with mixed anxiety and depressed mood: Secondary | ICD-10-CM | POA: Diagnosis not present

## 2023-12-21 MED ORDER — HYDROCODONE-ACETAMINOPHEN 10-325 MG PO TABS
1.0000 | ORAL_TABLET | Freq: Three times a day (TID) | ORAL | 0 refills | Status: AC | PRN
  Filled 2023-12-21: qty 15, 5d supply, fill #0

## 2023-12-21 NOTE — Addendum Note (Signed)
Addended by: Lenn Sink on: 12/21/2023 01:54 PM   Modules accepted: Orders

## 2023-12-22 DIAGNOSIS — M722 Plantar fascial fibromatosis: Secondary | ICD-10-CM | POA: Diagnosis not present

## 2023-12-23 ENCOUNTER — Encounter: Payer: Commercial Managed Care - PPO | Admitting: Obstetrics and Gynecology

## 2023-12-24 ENCOUNTER — Encounter: Payer: Self-pay | Admitting: Podiatry

## 2023-12-24 ENCOUNTER — Other Ambulatory Visit: Payer: Self-pay

## 2023-12-24 ENCOUNTER — Other Ambulatory Visit (HOSPITAL_BASED_OUTPATIENT_CLINIC_OR_DEPARTMENT_OTHER): Payer: Self-pay

## 2023-12-25 ENCOUNTER — Other Ambulatory Visit (HOSPITAL_BASED_OUTPATIENT_CLINIC_OR_DEPARTMENT_OTHER): Payer: Self-pay

## 2023-12-25 ENCOUNTER — Other Ambulatory Visit: Payer: Self-pay

## 2023-12-28 ENCOUNTER — Encounter: Payer: Self-pay | Admitting: Podiatry

## 2023-12-28 ENCOUNTER — Ambulatory Visit (INDEPENDENT_AMBULATORY_CARE_PROVIDER_SITE_OTHER): Payer: Commercial Managed Care - PPO

## 2023-12-28 ENCOUNTER — Ambulatory Visit (INDEPENDENT_AMBULATORY_CARE_PROVIDER_SITE_OTHER): Payer: Commercial Managed Care - PPO | Admitting: Podiatry

## 2023-12-28 DIAGNOSIS — Z9889 Other specified postprocedural states: Secondary | ICD-10-CM

## 2023-12-28 DIAGNOSIS — M722 Plantar fascial fibromatosis: Secondary | ICD-10-CM

## 2023-12-30 ENCOUNTER — Encounter: Payer: Self-pay | Admitting: Obstetrics and Gynecology

## 2023-12-30 ENCOUNTER — Other Ambulatory Visit: Payer: Self-pay

## 2023-12-30 ENCOUNTER — Other Ambulatory Visit (HOSPITAL_BASED_OUTPATIENT_CLINIC_OR_DEPARTMENT_OTHER): Payer: Self-pay

## 2023-12-30 MED ORDER — AMPHETAMINE-DEXTROAMPHETAMINE 20 MG PO TABS
20.0000 mg | ORAL_TABLET | Freq: Two times a day (BID) | ORAL | 0 refills | Status: DC
  Filled 2023-12-30: qty 60, 30d supply, fill #0

## 2023-12-30 MED ORDER — AMPHETAMINE-DEXTROAMPHETAMINE 20 MG PO TABS: 20.0000 mg | ORAL_TABLET | Freq: Two times a day (BID) | ORAL | 0 refills | Status: DC

## 2024-01-04 DIAGNOSIS — F4323 Adjustment disorder with mixed anxiety and depressed mood: Secondary | ICD-10-CM | POA: Diagnosis not present

## 2024-01-05 ENCOUNTER — Other Ambulatory Visit (HOSPITAL_BASED_OUTPATIENT_CLINIC_OR_DEPARTMENT_OTHER): Payer: Self-pay

## 2024-01-05 ENCOUNTER — Telehealth: Payer: Commercial Managed Care - PPO | Admitting: Physician Assistant

## 2024-01-05 DIAGNOSIS — B372 Candidiasis of skin and nail: Secondary | ICD-10-CM

## 2024-01-05 MED ORDER — NYSTATIN 100000 UNIT/GM EX CREA
1.0000 | TOPICAL_CREAM | Freq: Two times a day (BID) | CUTANEOUS | 0 refills | Status: DC
Start: 2024-01-05 — End: 2024-05-24
  Filled 2024-01-05: qty 30, 15d supply, fill #0

## 2024-01-05 NOTE — Progress Notes (Signed)
I have spent 5 minutes in review of e-visit questionnaire, review and updating patient chart, medical decision making and response to patient.   Piedad Climes, PA-C

## 2024-01-05 NOTE — Progress Notes (Signed)
E Visit for Rash  We are sorry that you are not feeling well. Here is how we plan to help!  Based upon your presentation it appears you have a yeast/fungal infection.  I have prescribed: and Nystatin cream apply to the affected area twice daily. Continue trying to keep area clean and dry. Try to make sure to wear breathable fabric when possible (I.e. cotton).    HOME CARE:  Take cool showers and avoid direct sunlight. Apply cool compress or wet dressings. Take a bath in an oatmeal bath.  Sprinkle content of one Aveeno packet under running faucet with comfortably warm water.  Bathe for 15-20 minutes, 1-2 times daily.  Pat dry with a towel. Do not rub the rash. Use hydrocortisone cream. Take an antihistamine like Benadryl for widespread rashes that itch.  The adult dose of Benadryl is 25-50 mg by mouth 4 times daily. Caution:  This type of medication may cause sleepiness.  Do not drink alcohol, drive, or operate dangerous machinery while taking antihistamines.  Do not take these medications if you have prostate enlargement.  Read package instructions thoroughly on all medications that you take.  GET HELP RIGHT AWAY IF:  Symptoms don't go away after treatment. Severe itching that persists. If you rash spreads or swells. If you rash begins to smell. If it blisters and opens or develops a yellow-brown crust. You develop a fever. You have a sore throat. You become short of breath.  MAKE SURE YOU:  Understand these instructions. Will watch your condition. Will get help right away if you are not doing well or get worse.  Thank you for choosing an e-visit.  Your e-visit answers were reviewed by a board certified advanced clinical practitioner to complete your personal care plan. Depending upon the condition, your plan could have included both over the counter or prescription medications.  Please review your pharmacy choice. Make sure the pharmacy is open so you can pick up prescription  now. If there is a problem, you may contact your provider through [employer] and have the prescription routed to another pharmacy.  Your safety is important to [place]. If you have drug allergies check your prescription carefully.   For the next 24 hours you can use MyChart to ask questions about today's visit, request a non-urgent call back, or ask for a work or school excuse. You will get an email in the next two days asking about your experience. I hope that your e-visit has been valuable and will speed your recovery.

## 2024-01-07 ENCOUNTER — Telehealth: Payer: Self-pay | Admitting: Podiatry

## 2024-01-07 NOTE — Telephone Encounter (Signed)
Completed FMLA paperwork from [facility] for the patient ....   Faxed to [phone]   .Marland Kitchen...   J. Abbott -- 01/07/2024

## 2024-01-11 ENCOUNTER — Encounter: Payer: Self-pay | Admitting: Podiatry

## 2024-01-11 ENCOUNTER — Ambulatory Visit (INDEPENDENT_AMBULATORY_CARE_PROVIDER_SITE_OTHER): Payer: Commercial Managed Care - PPO | Admitting: Podiatry

## 2024-01-11 DIAGNOSIS — M722 Plantar fascial fibromatosis: Secondary | ICD-10-CM | POA: Diagnosis not present

## 2024-01-14 ENCOUNTER — Other Ambulatory Visit: Payer: Self-pay

## 2024-01-14 NOTE — Progress Notes (Signed)
 Subjective:   Patient ID: [name], female   DOB: 48 y.o.   MRN: [id number]   HPI Patient states doing very well with right heel very pleased neuro   ROS      Objective:  Physical Exam  Neurovascular status intact muscle strength found to be adequate range of motion adequate incision sites healing well wound edges well coapted stitches intact minimal plantar pain     Assessment:  Acute plantar fasciitis right improved with incisions healing well     Plan:  H&P reviewed stitches removed wound edges coapted well dispensed ankle compression stocking discussed gradual return to soft shoe gear reappoint to recheck as needed

## 2024-01-20 ENCOUNTER — Ambulatory Visit (INDEPENDENT_AMBULATORY_CARE_PROVIDER_SITE_OTHER): Payer: Commercial Managed Care - PPO | Admitting: Obstetrics and Gynecology

## 2024-01-20 ENCOUNTER — Encounter: Payer: Self-pay | Admitting: Obstetrics and Gynecology

## 2024-01-20 VITALS — BP 118/76

## 2024-01-20 DIAGNOSIS — Z48816 Encounter for surgical aftercare following surgery on the genitourinary system: Secondary | ICD-10-CM

## 2024-01-20 DIAGNOSIS — Z9889 Other specified postprocedural states: Secondary | ICD-10-CM

## 2024-01-20 NOTE — Progress Notes (Signed)
 [facility]  Date of Visit: 01/20/2024  History of Present Illness: Ms. Bonito is a 48 y.o. female scheduled today for a post-operative visit.   Surgery: s/p Robotic assisted total laparoscopic hysterectomy with bilateral salpingectomy, right ovarian cystectomy, Sacrocolpopexy Lorna Few Lite Y), midurethral sling (Advantage Fit), cystoscopy on 12/09/23  She passed her postoperative void trial.   Postoperative course has been uncomplicated.   Today she reports she is feeling well, no issues.   UTI in the last 6 weeks? No  Pain? No  She has returned to her normal activity (except for postop restrictions)- also had foot surgery recently Vaginal bulge? No  Stress incontinence: No  Urgency/frequency: No  Urge incontinence: No  Voiding dysfunction: No  Bowel issues: No   Subjective Success: Do you usually have a bulge or something falling out that you can see or feel in the vaginal area? No  Retreatment Success: Any retreatment with surgery or pessary for any compartment? No   Pathology results: UTERUS, CERVIX, BILATERAL FALLOPIAN TUBES, RIGHT OVARIAN CYST WALL:  - Uterus with leiomyomata, largest measuring 1.6 cm  - Benign proliferative endometrium  - Benign unremarkable cervix  - Benign follicular cyst of the ovary  - Benign unremarkable bilateral fallopian tubes  - No evidence of malignancy   Medications: She has a current medication list which includes the following prescription(s): amphetamine-dextroamphetamine, buspirone, calcium carb-cholecalciferol, cyanocobalamin, duloxetine, esomeprazole, famotidine, fusion plus, lorazepam, nystatin cream, pediatric multivit-minerals-c, polyethylene glycol powder, pramipexole, pramipexole, rizatriptan, and trazodone.   Allergies: Patient is allergic to nsaids.   Physical Exam: BP 118/76 (BP Location: Left Arm, Patient Position: Sitting, Cuff Size: Normal)   Abdomen: soft, non-tender, without masses or  organomegaly Laparoscopic Incisions: healing well.  Pelvic Examination: Vagina: Incisions healing well. Sutures are present at incision line and there is not granulation tissue. No tenderness along the anterior or posterior vagina. No apical tenderness. No pelvic masses. No visible or palpable mesh.  POP-Q: POP-Q  -3                                            Aa   -3                                           Ba  -7                                              C   3                                            Gh  4                                            Pb  7  tvl   -3                                            Ap  -3                                            Bp                                                 D    ---------------------------------------------------------  Assessment and Plan:  1. Post-operative state     - Well healed - Pathology results were reviewed with the patient today and she verbalized understanding that the results were benign.  - Can resume regular activity including exercise. Wait an additional 6 weeks for intercourse. Discussed avoidance of heavy lifting and straining long term to reduce the risk of recurrence.   Follow up as needed  Marguerita Beards, MD

## 2024-01-22 ENCOUNTER — Other Ambulatory Visit: Payer: Self-pay

## 2024-01-26 ENCOUNTER — Other Ambulatory Visit (HOSPITAL_BASED_OUTPATIENT_CLINIC_OR_DEPARTMENT_OTHER): Payer: Self-pay

## 2024-01-26 MED ORDER — AMPHETAMINE-DEXTROAMPHETAMINE 20 MG PO TABS
20.0000 mg | ORAL_TABLET | Freq: Two times a day (BID) | ORAL | 0 refills | Status: DC
  Filled 2024-01-28: qty 60, 30d supply, fill #0

## 2024-01-27 ENCOUNTER — Other Ambulatory Visit (HOSPITAL_BASED_OUTPATIENT_CLINIC_OR_DEPARTMENT_OTHER): Payer: Self-pay

## 2024-01-27 NOTE — Telephone Encounter (Signed)
 See prev notes

## 2024-01-28 ENCOUNTER — Other Ambulatory Visit (HOSPITAL_BASED_OUTPATIENT_CLINIC_OR_DEPARTMENT_OTHER): Payer: Self-pay

## 2024-02-01 DIAGNOSIS — F4323 Adjustment disorder with mixed anxiety and depressed mood: Secondary | ICD-10-CM | POA: Diagnosis not present

## 2024-02-02 DIAGNOSIS — F330 Major depressive disorder, recurrent, mild: Secondary | ICD-10-CM | POA: Diagnosis not present

## 2024-02-08 DIAGNOSIS — F411 Generalized anxiety disorder: Secondary | ICD-10-CM | POA: Diagnosis not present

## 2024-02-12 DIAGNOSIS — F330 Major depressive disorder, recurrent, mild: Secondary | ICD-10-CM | POA: Diagnosis not present

## 2024-02-15 ENCOUNTER — Encounter: Payer: Commercial Managed Care - PPO | Admitting: Family

## 2024-02-15 DIAGNOSIS — F4323 Adjustment disorder with mixed anxiety and depressed mood: Secondary | ICD-10-CM | POA: Diagnosis not present

## 2024-02-16 ENCOUNTER — Encounter: Payer: Self-pay | Admitting: Podiatry

## 2024-02-16 ENCOUNTER — Other Ambulatory Visit (HOSPITAL_BASED_OUTPATIENT_CLINIC_OR_DEPARTMENT_OTHER): Payer: Self-pay

## 2024-02-16 MED ORDER — LORAZEPAM 1 MG PO TABS
1.0000 mg | ORAL_TABLET | Freq: Two times a day (BID) | ORAL | 3 refills | Status: DC | PRN
  Filled 2024-02-16: qty 60, 30d supply, fill #0
  Filled 2024-03-17: qty 60, 30d supply, fill #1
  Filled 2024-04-21: qty 60, 30d supply, fill #2
  Filled 2024-05-19 – 2024-05-23 (×2): qty 60, 30d supply, fill #3

## 2024-02-17 ENCOUNTER — Ambulatory Visit (INDEPENDENT_AMBULATORY_CARE_PROVIDER_SITE_OTHER): Payer: Commercial Managed Care - PPO | Admitting: Family

## 2024-02-17 ENCOUNTER — Other Ambulatory Visit (HOSPITAL_BASED_OUTPATIENT_CLINIC_OR_DEPARTMENT_OTHER): Payer: Self-pay

## 2024-02-17 VITALS — BP 129/69 | HR 113 | Temp 98.7°F | Resp 16 | Ht 67.0 in | Wt 238.0 lb

## 2024-02-17 DIAGNOSIS — Z0000 Encounter for general adult medical examination without abnormal findings: Secondary | ICD-10-CM

## 2024-02-17 DIAGNOSIS — Z23 Encounter for immunization: Secondary | ICD-10-CM

## 2024-02-17 DIAGNOSIS — F330 Major depressive disorder, recurrent, mild: Secondary | ICD-10-CM | POA: Diagnosis not present

## 2024-02-17 DIAGNOSIS — Z9884 Bariatric surgery status: Secondary | ICD-10-CM | POA: Diagnosis not present

## 2024-02-17 DIAGNOSIS — K219 Gastro-esophageal reflux disease without esophagitis: Secondary | ICD-10-CM | POA: Diagnosis not present

## 2024-02-17 DIAGNOSIS — Z1231 Encounter for screening mammogram for malignant neoplasm of breast: Secondary | ICD-10-CM

## 2024-02-17 DIAGNOSIS — E611 Iron deficiency: Secondary | ICD-10-CM | POA: Diagnosis not present

## 2024-02-17 DIAGNOSIS — L304 Erythema intertrigo: Secondary | ICD-10-CM | POA: Diagnosis not present

## 2024-02-17 DIAGNOSIS — G43909 Migraine, unspecified, not intractable, without status migrainosus: Secondary | ICD-10-CM | POA: Diagnosis not present

## 2024-02-17 LAB — CBC WITH DIFFERENTIAL/PLATELET
Basophils Absolute: 0.1 10*3/uL (ref 0.0–0.1)
Basophils Relative: 0.9 % (ref 0.0–3.0)
Eosinophils Absolute: 0.1 10*3/uL (ref 0.0–0.7)
Eosinophils Relative: 1.4 % (ref 0.0–5.0)
HCT: 40.1 % (ref 36.0–46.0)
Hemoglobin: 13.4 g/dL (ref 12.0–15.0)
Lymphocytes Relative: 18.1 % (ref 12.0–46.0)
Lymphs Abs: 1.2 10*3/uL (ref 0.7–4.0)
MCHC: 33.4 g/dL (ref 30.0–36.0)
MCV: 92.9 fl (ref 78.0–100.0)
Monocytes Absolute: 0.8 10*3/uL (ref 0.1–1.0)
Monocytes Relative: 11.6 % (ref 3.0–12.0)
Neutro Abs: 4.7 10*3/uL (ref 1.4–7.7)
Neutrophils Relative %: 68 % (ref 43.0–77.0)
Platelets: 323 10*3/uL (ref 150.0–400.0)
RBC: 4.32 Mil/uL (ref 3.87–5.11)
RDW: 13.3 % (ref 11.5–15.5)
WBC: 6.9 10*3/uL (ref 4.0–10.5)

## 2024-02-17 LAB — IBC PANEL
Iron: 60 ug/dL (ref 42–145)
Saturation Ratios: 15.9 % — ABNORMAL LOW (ref 20.0–50.0)
TIBC: 376.6 ug/dL (ref 250.0–450.0)
Transferrin: 269 mg/dL (ref 212.0–360.0)

## 2024-02-17 LAB — HEMOGLOBIN A1C: Hgb A1c MFr Bld: 5.2 % (ref 4.6–6.5)

## 2024-02-17 MED ORDER — NYSTATIN 100000 UNIT/GM EX POWD
1.0000 | Freq: Two times a day (BID) | CUTANEOUS | 5 refills | Status: AC | PRN
  Filled 2024-02-17: qty 30, 15d supply, fill #0
  Filled 2024-03-24: qty 30, 15d supply, fill #1
  Filled 2024-08-08: qty 30, 15d supply, fill #2
  Filled 2024-10-12: qty 30, 15d supply, fill #3

## 2024-02-17 MED ORDER — NURTEC 75 MG PO TBDP
ORAL_TABLET | ORAL | 0 refills | Status: DC
  Filled 2024-02-17: qty 16, 30d supply, fill #0
  Filled 2024-03-17 – 2024-04-29 (×2): qty 14, 30d supply, fill #1
  Filled 2024-05-16: qty 14, 28d supply, fill #1

## 2024-02-17 NOTE — Telephone Encounter (Signed)
 She can come in today at 11:30 or tomorrow 11:30

## 2024-02-17 NOTE — Assessment & Plan Note (Signed)
 On oral iron supplement. Update iron studies/cbc.

## 2024-02-17 NOTE — Assessment & Plan Note (Signed)
 Reports intermittent yeast infection beneath pannus.  Will rx with nystatin powder.

## 2024-02-17 NOTE — Progress Notes (Signed)
 Subjective:     Patient ID: [name], female    DOB: 02-03-1976, 48 y.o.   MRN: [id number]  Chief Complaint  Patient presents with   Annual Exam    HPI  Discussed the use of AI scribe software for clinical note transcription with the patient, who gave verbal consent to proceed.  History of Present Illness  [name] is a 48 year old female who presents for an annual physical exam.  She experiences elevated blood pressure readings at home, ranging from 130s to 140s over 80s to 100s, with today's reading at 129/69. Associated symptoms include headaches and a racing heart when her blood pressure is elevated.  Her migraines have increased in frequency, now occurring about twice a month, each lasting two to four hours. She uses Maxalt for management, which is variably effective, and cannot take ibuprofen.  She has a history of gastric bypass surgery and is concerned about weight gain, describing it as 'out of control.' She is seeking assistance with weight management and has requested a referral to [facility].  She has a recent diagnosis of adult-onset autism and is working with a Paramedic and a medication management provider. She is currently on Buspar, Cymbalta, and pramipexole for mood and anxiety management.  She experiences reflux and is taking Nexium and Pepcid. Missing a dose results in significant discomfort, requiring her to chew antacids.  She reports hip pain, attributed to altered gait following foot surgery, and mentions a yeasty rash in skin folds, for which she has used antifungal treatment.  She consumes one to two alcoholic drinks per month, does not use drugs, and does not smoke. She homeschools her children and works full-time, contributing to her stress and anxiety. No current cough, cold symptoms, unusual muscle or joint pain beyond the hip pain, urinary concerns, or skin concerns beyond the rash. No issues with digestion,  constipation, or diarrhea.     Health Maintenance Due  Topic Date Due   Pneumococcal Vaccine 47-61 Years old (1 of 2 - PCV) Never done   COVID-19 Vaccine (5 - 2024-25 season) 07/26/2023    Past Medical History:  Diagnosis Date   ADHD (attention deficit hyperactivity disorder)    GAD (generalized anxiety disorder)    GERD (gastroesophageal reflux disease)    Headache(784.0)    History of gastric ulcer    partial ulcer perferation gastrojujunal anastomosis  post gastric bypass  -- s/p  surgical repair 2012 and 2013   History of palpitations in adulthood    per pt had occasional palpitations ,  told had PVCs   History of seizures as a child    febrile   IDA (iron deficiency anemia)    Insomnia    Irritable bowel syndrome with diarrhea    MDD (major depressive disorder)    PONV (postoperative nausea and vomiting)    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/  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;   anastomtic ulcer s/p bypass s/p repair x2 2012 and 2013;   SUI (stress urinary incontinence, female)    Wears glasses     Past Surgical History:  Procedure Laterality Date   BLADDER SUSPENSION N/A 12/09/2023   Procedure: TRANSVAGINAL TAPE (TVT) PROCEDURE;  Surgeon: Marguerita Beards, MD;  Location: [facility];  Service: Gynecology;  Laterality: N/A;   COLONOSCOPY WITH PROPOFOL  2023   CYSTOSCOPY N/A 12/09/2023  Procedure: CYSTOSCOPY;  Surgeon: Marguerita Beards, MD;  Location: [facility];  Service: Gynecology;  Laterality: N/A;   DILITATION & CURRETTAGE/HYSTROSCOPY WITH ESSURE  2015   bilateral tubal sterilization with essure   ESOPHAGOGASTRODUODENOSCOPY  11/28/2011   Procedure: ESOPHAGOGASTRODUODENOSCOPY (EGD);  Surgeon: Theda Belfast, MD;  Location: [facility];  Service: Endoscopy;  Laterality: N/A;   LAPAROSCOPIC CHOLECYSTECTOMY  2006   LAPAROSCOPIC GASTROTOMY W/ REPAIR OF ULCER  10/23/2009   @[facility] ;   by  dr s. gross;  EXTENSIVE LYSIS ADHESIONS/  OMENTAL PATCH OF ULCER   LAPAROSCOPY ABDOMEN DIAGNOSTIC  02/21/2011   @[facility]  by dr Bea Laura. wilson/ dr b. Janee Morn;  upper endoscopy/  LAPAROSCOPIC OMENTAL PATCH REPAIR GASTROJEJUNAL ANASTOMTIC PERFORATION / PLACEMENT DRAIN   ROBOTIC ASSISTED LAPAROSCOPIC OVARIAN CYSTECTOMY Right 12/09/2023   Procedure: XI ROBOTIC ASSISTED LAPAROSCOPIC OVARIAN CYSTECTOMY;  Surgeon: Marguerita Beards, MD;  Location: [facility];  Service: Gynecology;  Laterality: Right;   ROUX-EN-Y GASTRIC BYPASS  2005   @ [facility] by dr Peri Jefferson;  laparoscopic   VENTRAL HERNIA REPAIR  12/22/2011   Procedure: HERNIA REPAIR VENTRAL ADULT;  Surgeon: Valarie Merino, MD;  Location: [facility];  Service: General;;  RESECTION/ RE-DO  OF PRIOR GASTROJEJUNOSTOMY (GASTRIC BYPASS REVISION)   XI ROBOTIC ASSISTED SMALL BOWEL RESECTION  06/05/2022   @ [facility] by dr Demetrius Charity. chandler;  for intussusception intestine   XI ROBOTIC ASSISTED TOTAL HYSTERECTOMY WITH SACROCOLPOPEXY N/A 12/09/2023   Procedure: XI ROBOTIC ASSISTED TOTAL HYSTERECTOMY WITH BILATERAL SALPINGECTOMY AND SACROCOLPOPEXY;  Surgeon: Marguerita Beards, MD;  Location: [facility];  Service: Gynecology;  Laterality: N/A;    Family History  Problem Relation Age of Onset   CVA Mother    Heart disease Mother        fibroelastoma on her heart, CAD, CABG   Cerebral aneurysm Mother    COPD Father    Liver cancer Father    Alcohol abuse Father    Drug abuse Brother    Alcohol abuse Brother    Drug abuse Brother    Alcohol abuse Brother    Heart disease Maternal Grandmother    Aneurysm Maternal Grandfather    Diabetes Paternal Grandmother    Drug abuse Son        in recovery   Asthma Son    Depression Son    ADD / ADHD Son    Anxiety disorder Son    Tourette syndrome Son    Anesthesia problems Neg Hx    Hypotension Neg Hx    Malignant hyperthermia Neg Hx    Pseudochol deficiency Neg Hx    Stroke Neg Hx      Social History   Socioeconomic History   Marital status: Married    Spouse name: Not on file   Number of children: Not on file   Years of education: Not on file   Highest education level: Bachelor's degree (e.g., BA, AB, BS)  Occupational History   Not on file  Tobacco Use   Smoking status: Never    Passive exposure: Past   Smokeless tobacco: Never  Vaping Use   Vaping status: Never Used  Substance and Sexual Activity   Alcohol use: No   Drug use: Never   Sexual activity: Yes    Birth control/protection: Surgical    Comment: hysteroscopic bilateral essure procedure  Other Topics Concern   Not on file  Social History Narrative   Married   2 sons (one is  grown)   One son at home   Step daughter at home    2 step sons out of the house   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    Social Drivers of Corporate investment banker Strain: Low Risk  (02/10/2024)   Overall Financial Resource Strain (CARDIA)    Difficulty of Paying Living Expenses: Not hard at all  Food Insecurity: No Food Insecurity (02/10/2024)   Hunger Vital Sign    Worried About Running Out of Food in the Last Year: Never true    Ran Out of Food in the Last Year: Never true  Transportation Needs: No Transportation Needs (02/10/2024)   PRAPARE - Administrator, Civil Service (Medical): No    Lack of Transportation (Non-Medical): No  Physical Activity: Inactive (02/10/2024)   Exercise Vital Sign    Days of Exercise per Week: 2 days    Minutes of Exercise per Session: 0 min  Stress: Stress Concern Present (02/10/2024)   [employer] of Occupational Health - Occupational Stress Questionnaire    Feeling of Stress : Very much  Social Connections: Socially Integrated (02/10/2024)   Social Connection and Isolation Panel [NHANES]    Frequency of Communication with Friends and Family: More than three times a week    Frequency  of Social Gatherings with Friends and Family: More than three times a week    Attends Religious Services: 1 to 4 times per year    Active Member of [employer] or Organizations: Yes    Attends Banker Meetings: More than 4 times per year    Marital Status: Married  Catering manager Violence: Unknown (03/08/2023)   Received from [employer], [facility]   HITS    Physically Hurt: Not on file    Insult or Talk Down To: Not on file    Threaten Physical Harm: Not on file    Scream or Curse: Not on file    Outpatient Medications Prior to Visit  Medication Sig Dispense Refill   amphetamine-dextroamphetamine (ADDERALL) 20 MG tablet Take 1 tablet (20 mg total) by mouth 2 (two) times daily. 60 tablet 0   busPIRone (BUSPAR) 30 MG tablet Take 1 tablet (30 mg total) by mouth 2 (two) times daily with a meal. 60 tablet 3   Calcium Carb-Cholecalciferol (CHEWABLE CALCIUM/D3 PO) Take by mouth 2 (two) times daily. One chew twice daily     cyanocobalamin 500 MCG tablet Take 500 mcg by mouth daily.     DULoxetine (CYMBALTA) 30 MG capsule Take 3 capsules (90 mg total) by mouth daily. 90 capsule 3   esomeprazole (NEXIUM) 40 MG capsule Take 1 capsule (40 mg total) by mouth daily. 90 capsule 4   famotidine (PEPCID) 40 MG tablet Take 0.5 tablets (20 mg total) by mouth 2 (two) times daily. 60 tablet 5   Iron-FA-B Cmp-C-Biot-Probiotic (FUSION PLUS) CAPS Take 1 capsule by mouth daily.     LORazepam (ATIVAN) 1 MG tablet Take 1 tablet (1 mg total) by mouth every 12 (twelve) hours as needed for severe anxiety. 60 tablet 3   nystatin cream (MYCOSTATIN) Apply 1 Application topically 2 (two) times daily. 30 g 0   Pediatric Multivit-Minerals-C (CHEWABLES MULTIVITAMIN PO) Take by mouth daily. One chew daily     polyethylene glycol powder (GLYCOLAX/MIRALAX) 17 GM/SCOOP powder Take 17 g by mouth daily. Drink 17g (1 scoop) dissolved in water per day. 238  g 0   pramipexole (MIRAPEX) 1.5 MG tablet Take 1 tablet (1.5 mg  total) by mouth at bedtime. 30 tablet 3   pramipexole (MIRAPEX) 1.5 MG tablet Take 1 tablet (1.5 mg total) by mouth at bedtime. 30 tablet 3   traZODone (DESYREL) 100 MG tablet Take 1-2 tablets (100-200 mg total) by mouth at bedtime as needed for insomnia. 60 tablet 2   rizatriptan (MAXALT) 10 MG tablet Take 1 tablet (10 mg total) by mouth as needed for migraine. May repeat in 2 hours if needed 10 tablet 3   No facility-administered medications prior to visit.    Allergies  Allergen Reactions   Nsaids Other (See Comments)    Avoids due to history gastric ulcers    Review of Systems  Constitutional:  Negative for weight loss.  HENT:  Negative for congestion.   Respiratory:  Negative for cough.   Gastrointestinal:  Negative for constipation and diarrhea.  Genitourinary:  Negative for dysuria.  Musculoskeletal:  Positive for joint pain (hip).       Some hip pain which she attributes to recent foot surgery on right.   Skin:  Negative for rash.  Neurological:  Negative for headaches.  Psychiatric/Behavioral:         Increased anxiety       Objective:    Physical Exam Constitutional:      General: She is not in acute distress.    Appearance: Normal appearance. She is well-developed.  HENT:     Head: Normocephalic and atraumatic.     Right Ear: Tympanic membrane, ear canal and external ear normal.     Left Ear: Tympanic membrane, ear canal and external ear normal.     Mouth/Throat:     Mouth: Mucous membranes are moist.     Pharynx: No posterior oropharyngeal erythema.  Eyes:     General: No scleral icterus.    Extraocular Movements: Extraocular movements intact.     Pupils: Pupils are equal, round, and reactive to light.  Neck:     Thyroid: No thyromegaly.  Cardiovascular:     Rate and Rhythm: Normal rate and regular rhythm.     Heart sounds: Normal heart sounds. No murmur heard. Pulmonary:     Effort: Pulmonary effort is normal. No respiratory distress.     Breath  sounds: Normal breath sounds. No wheezing.  Musculoskeletal:        General: No swelling.     Cervical back: Neck supple. No rigidity or tenderness.  Lymphadenopathy:     Cervical: No cervical adenopathy.  Skin:    General: Skin is warm and dry.  Neurological:     General: No focal deficit present.     Mental Status: She is alert and oriented to person, place, and time.  Psychiatric:        Mood and Affect: Mood normal.        Behavior: Behavior normal.        Thought Content: Thought content normal.        Judgment: Judgment normal.      BP 129/69 (BP Location: Right Arm, Patient Position: Sitting, Cuff Size: Large)   Pulse (!) 113   Temp 98.7 F (37.1 C) (Oral)   Resp 16   Ht 5\' 7"  (1.702 m)   Wt 238 lb (108 kg)   SpO2 98%   BMI 37.28 kg/m  Wt Readings from Last 3 Encounters:  02/17/24 238 lb (108 kg)  12/09/23 231 lb (104.8 kg)  12/02/23  227 lb (103 kg)       Assessment & Plan:   Problem List Items Addressed This Visit       Unprioritized   Preventative health care - Primary    General Health Maintenance Routine health maintenance discussed. She desires Shingrix vaccine but declines pneumococcal vaccine. Mammogram completed in January 2024. - Administer Shingrix vaccine today. - Schedule second Shingrix dose in 3 months. - Check B12, vitamin D, blood count, and iron studies due to gastric bypass.      Migraine   Occurs about twice a month.  Maxalt does not always help her headaches. Will give trial of prn Nurtec instead.      Relevant Medications   Rimegepant Sulfate (NURTEC) 75 MG TBDP   Iron deficiency   On oral iron supplement. Update iron studies/cbc.      Relevant Orders   CBC w/Diff   IBC panel   Intertrigo   Reports intermittent yeast infection beneath pannus.  Will rx with nystatin powder.       Relevant Medications   nystatin powder   Gastroesophageal reflux disease   Stable on pepcid and nexium.        Other Visit Diagnoses        History of gastric bypass       Relevant Orders   B12   VITAMIN D 25 Hydroxy (Vit-D Deficiency, Fractures)     Breast cancer screening by mammogram       Relevant Orders   MM 3D SCREENING MAMMOGRAM BILATERAL BREAST     Morbid obesity (HCC)       Relevant Orders   Amb Ref to Medical Weight Management   HgB A1c     Need for shingles vaccine       Relevant Orders   Zoster Recombinant (Shingrix ) (Completed)       I have discontinued Loucile L. Modesitt's rizatriptan. I am also having her start on Nurtec and nystatin. Additionally, I am having her maintain her cyanocobalamin, Calcium Carb-Cholecalciferol (CHEWABLE CALCIUM/D3 PO), Fusion Plus, esomeprazole, famotidine, pramipexole, polyethylene glycol powder, Pediatric Multivit-Minerals-C (CHEWABLES MULTIVITAMIN PO), busPIRone, traZODone, pramipexole, DULoxetine, nystatin cream, amphetamine-dextroamphetamine, and LORazepam.  Meds ordered this encounter  Medications   Rimegepant Sulfate (NURTEC) 75 MG TBDP    Sig: Take 1 tablet by mouth once daily as needed for migraine    Dispense:  30 tablet    Refill:  0    Supervising Provider:   Danise Edge A [[id number]]   nystatin powder    Sig: Apply 1 Application topically 2 (two) times daily as needed.    Dispense:  30 g    Refill:  5    Supervising Provider:   Danise Edge A [[id number]]

## 2024-02-17 NOTE — Assessment & Plan Note (Signed)
  General Health Maintenance Routine health maintenance discussed. She desires Shingrix vaccine but declines pneumococcal vaccine. Mammogram completed in January 2024. - Administer Shingrix vaccine today. - Schedule second Shingrix dose in 3 months. - Check B12, vitamin D, blood count, and iron studies due to gastric bypass.

## 2024-02-17 NOTE — Assessment & Plan Note (Addendum)
 Occurs about twice a month.  Maxalt does not always help her headaches. Will give trial of prn Nurtec instead.

## 2024-02-17 NOTE — Progress Notes (Addendum)
 Established Patient Office Visit  Subjective   Patient ID: SHARYAH BOSTWICK, female    DOB: 1976/03/02  Age: 48 y.o. MRN: [id number]  Chief Complaint  Patient presents with   Annual Exam    Pleasant 47 yo female in for annual physical. Patient states she feels well overall. Voices concern regarding elevated blood pressures at home. States her home readings are 130s-140s/80s-100s. She also states that she occasionally develops a headache and racing heart when her blood pressure is elevated. Also, c/o increase in frequency of migraine headache events. Currently having a migraine 2 times per month with each event lasting 2-4 hours.   Patient has a history of weight loss surgery, and reports recent weight gain. [facility]ing [facility] to [facility]. Patient also reports "yeast" in abdominal folds.   She also states that she has recently been diagnosed with autism and is being managed by a therapist and behavioral health provider.   Dental: Seeking a new dentist. States she does not need a referral. Has not had a routine check-up in greater than 1 year.  Eye: last eye exam in November 29, 2023. Wears glasses.  Gyn: Recent hysterectomy Diet and exercise: states she has not been focusing on diet or exercise. Social history: denies tobacco/illicit drug use, consumes 1-2 alcoholic beverages per month Housing: Rents a townhome. 4 people live in home. Good support system. Married. Monogamous.  Family history: no changes in family history. New surgical history: 11/2023 laparoscopic hysterectomy with bilateral salpingectomy, sacrocolpopexy, midurethral sling, cystoscopy 12/2023 right foot plantar fasciotomy    Review of Systems  Constitutional: Negative.   HENT: Negative.    Respiratory: Negative.    Cardiovascular:  Positive for palpitations.       Reports palpitations when her blood pressure is high. Denies chest pain or dyspnea.   Gastrointestinal:  Negative.   Genitourinary: Negative.   Musculoskeletal: Negative.   Skin: Negative.   Neurological:  Positive for headaches. Negative for dizziness.       States she is having more frequent migraine headaches. Also reporting headaches when BP is elevated.  Psychiatric/Behavioral: Negative.        Objective:     BP 129/69 (BP Location: Right Arm, Patient Position: Sitting, Cuff Size: Large)   Pulse (!) 113   Temp 98.7 F (37.1 C) (Oral)   Resp 16   Ht 5\' 7"  (1.702 m)   Wt 238 lb (108 kg)   SpO2 98%   BMI 37.28 kg/m   BP Readings from Last 3 Encounters:  02/17/24 129/69  01/20/24 118/76  12/10/23 117/79     Physical Exam Vitals reviewed.  Constitutional:      Appearance: Normal appearance.  HENT:     Head: Normocephalic and atraumatic.     Right Ear: External ear normal.     Left Ear: External ear normal.     Mouth/Throat:     Mouth: Mucous membranes are moist.     Pharynx: Oropharynx is clear.  Eyes:     Extraocular Movements: Extraocular movements intact.     Conjunctiva/sclera: Conjunctivae normal.     Pupils: Pupils are equal, round, and reactive to light.  Cardiovascular:     Rate and Rhythm: Normal rate and regular rhythm.     Pulses: Normal pulses.     Heart sounds: Normal heart sounds.  Pulmonary:     Effort: Pulmonary effort is normal.     Breath sounds: Normal breath sounds.  Abdominal:  General: Bowel sounds are normal.  Skin:    General: Skin is warm and dry.     Capillary Refill: Capillary refill takes less than 2 seconds.  Neurological:     Mental Status: She is alert and oriented to person, place, and time.  Psychiatric:        Mood and Affect: Mood normal.        Behavior: Behavior normal.      Assessment & Plan:   -Migraine - patient experiencing more frequent migraine headaches. D/C Maxalt and start NURTEC. Patient to notify of improvement or worsening.  -GERD - stable on Nexium and Pepcid  -Depression/anxiety - stable on  duloxetine, buspirone, and lorazepam. Follows up with behavioral health for behavioral and medication management.   -Iron deficiency - continues on oral iron supplement. Will recheck iron studies today.   -Preventive health care - Shingrix vaccine today     Cristopher Peru, RN

## 2024-02-17 NOTE — Assessment & Plan Note (Signed)
 Stable on pepcid and nexium.

## 2024-02-17 NOTE — Patient Instructions (Signed)
 VISIT SUMMARY:  Today, we conducted your annual physical exam and discussed several health concerns, including your blood pressure, migraines, weight management, reflux, hip pain, and a skin rash. We also reviewed your recent diagnosis of adult-onset autism and your current medications. Routine health maintenance was addressed, and we administered the Shingrix vaccine.  YOUR PLAN:  -HYPERTENSION: Hypertension means high blood pressure. Your home readings have been elevated, and you experience headaches and a racing heart when this happens. We recommend continuing to monitor your blood pressure at home and considering lifestyle changes to help control it. If your readings remain high, we may need to adjust your medication.  -MIGRAINE: Migraines are severe headaches that can cause significant pain and other symptoms. Your migraines have increased in frequency, and Maxalt is not always effective. We will prescribe Nurtec as an alternative and suggest downloading a coupon from the Nurtec website to help with the cost. Please stop using Maxalt for now.  -OBESITY: Obesity refers to having an excessive amount of body fat. You are concerned about weight gain after your gastric bypass surgery. We will refer you to the [facility] for assistance with weight management.  -GASTROESOPHAGEAL REFLUX DISEASE (GERD): GERD is a condition where stomach acid frequently flows back into the tube connecting your mouth and stomach, causing discomfort. Your symptoms are well-managed with Nexium and Pepcid, so continue taking these medications as prescribed.  -YEAST INFECTION: A yeast infection is a fungal infection that can cause a rash in moist areas of the skin. You have a yeasty rash in your skin folds. We recommend using a hair dryer to dry these areas after showering and applying powder to keep them dry.  -ADULT ONSET AUTISM: Adult-onset autism is a developmental disorder that affects  communication and behavior. You have been recently diagnosed and are working with a therapist and medication management provider. No specific changes were discussed today.  -GENERAL HEALTH MAINTENANCE: We discussed routine health maintenance. You received the Shingrix vaccine today and will need a second dose in 3 months. We will also check your B12, vitamin D, blood count, and iron levels due to your gastric bypass surgery.  INSTRUCTIONS:  Please continue to monitor your blood pressure at home and keep a log of your readings. Download a coupon for Nurtec from their website to help with the cost and start using it for your migraines, discontinuing Maxalt for now. Follow up with the [facility] for weight management. Continue taking Nexium and Pepcid as prescribed for your reflux. Use a hair dryer and powder to manage your yeast infection. Schedule your second Shingrix dose in 3 months. We will check your B12, vitamin D, blood count, and iron levels at your next visit.

## 2024-02-18 ENCOUNTER — Ambulatory Visit (INDEPENDENT_AMBULATORY_CARE_PROVIDER_SITE_OTHER)

## 2024-02-18 ENCOUNTER — Encounter: Payer: Self-pay | Admitting: Podiatry

## 2024-02-18 ENCOUNTER — Telehealth (HOSPITAL_BASED_OUTPATIENT_CLINIC_OR_DEPARTMENT_OTHER): Payer: Self-pay

## 2024-02-18 ENCOUNTER — Other Ambulatory Visit (HOSPITAL_BASED_OUTPATIENT_CLINIC_OR_DEPARTMENT_OTHER): Payer: Self-pay

## 2024-02-18 ENCOUNTER — Encounter (HOSPITAL_BASED_OUTPATIENT_CLINIC_OR_DEPARTMENT_OTHER): Payer: Self-pay

## 2024-02-18 ENCOUNTER — Ambulatory Visit: Admitting: Podiatry

## 2024-02-18 VITALS — Ht 67.0 in | Wt 238.0 lb

## 2024-02-18 DIAGNOSIS — M722 Plantar fascial fibromatosis: Secondary | ICD-10-CM

## 2024-02-18 DIAGNOSIS — M7671 Peroneal tendinitis, right leg: Secondary | ICD-10-CM

## 2024-02-18 DIAGNOSIS — Z9889 Other specified postprocedural states: Secondary | ICD-10-CM | POA: Diagnosis not present

## 2024-02-18 LAB — VITAMIN B12: Vitamin B-12: 266 pg/mL (ref 211–911)

## 2024-02-18 LAB — VITAMIN D 25 HYDROXY (VIT D DEFICIENCY, FRACTURES): VITD: 35.82 ng/mL (ref 30.00–100.00)

## 2024-02-18 MED ORDER — TRIAMCINOLONE ACETONIDE 10 MG/ML IJ SUSP
10.0000 mg | Freq: Once | INTRAMUSCULAR | Status: AC
  Administered 2024-02-18: 10 mg via INTRA_ARTICULAR

## 2024-02-18 NOTE — Progress Notes (Signed)
 Subjective:   Patient ID: [name], female   DOB: 48 y.o.   MRN: [id number]   HPI Patient presents stating that the heel is doing very well after surgery but she is getting a lot of pain in the outside of her foot and that is been tender and hard to walk with at times.  States that she may have been walking differently during recovery   ROS      Objective:  Physical Exam  Neuro vascular status intact inflammation of the lateral side peroneal insertion with pain with patient having negative Denna Haggard' sign incisions well-healed     Assessment:  Peroneal tendinitis right with inflammation     Plan:  Explained that this is probably compensation and hopefully will get better quickly sterile prep injected the peroneal insertion 3 mg dexamethasone Kenalog 5 mg Xylocaine after explaining risk with sterile dressing and reappoint as symptoms indicate  X-rays were negative for signs of arch height loss or pathology lateral right foot

## 2024-02-19 ENCOUNTER — Other Ambulatory Visit: Payer: Self-pay | Admitting: Family

## 2024-02-19 ENCOUNTER — Encounter: Payer: Self-pay | Admitting: Family

## 2024-02-19 DIAGNOSIS — F330 Major depressive disorder, recurrent, mild: Secondary | ICD-10-CM | POA: Diagnosis not present

## 2024-02-19 MED ORDER — MULTI-VITAMIN/MINERALS PO TABS: 1.0000 | ORAL_TABLET | Freq: Every day | ORAL | Status: AC

## 2024-02-22 ENCOUNTER — Inpatient Hospital Stay (HOSPITAL_BASED_OUTPATIENT_CLINIC_OR_DEPARTMENT_OTHER): Admission: RE | Admit: 2024-02-22 | Source: Ambulatory Visit | Admitting: Radiology

## 2024-02-22 ENCOUNTER — Encounter (HOSPITAL_BASED_OUTPATIENT_CLINIC_OR_DEPARTMENT_OTHER): Payer: Self-pay

## 2024-02-22 DIAGNOSIS — F330 Major depressive disorder, recurrent, mild: Secondary | ICD-10-CM | POA: Diagnosis not present

## 2024-02-23 ENCOUNTER — Other Ambulatory Visit (HOSPITAL_BASED_OUTPATIENT_CLINIC_OR_DEPARTMENT_OTHER): Payer: Self-pay

## 2024-02-23 MED ORDER — AMPHETAMINE-DEXTROAMPHETAMINE 20 MG PO TABS
20.0000 mg | ORAL_TABLET | Freq: Two times a day (BID) | ORAL | 0 refills | Status: DC
  Filled 2024-02-23 – 2024-02-28 (×2): qty 60, 30d supply, fill #0

## 2024-02-24 ENCOUNTER — Other Ambulatory Visit (HOSPITAL_COMMUNITY): Payer: Self-pay

## 2024-02-24 ENCOUNTER — Encounter (INDEPENDENT_AMBULATORY_CARE_PROVIDER_SITE_OTHER): Payer: Self-pay

## 2024-02-29 ENCOUNTER — Other Ambulatory Visit (HOSPITAL_BASED_OUTPATIENT_CLINIC_OR_DEPARTMENT_OTHER): Payer: Self-pay

## 2024-02-29 DIAGNOSIS — F330 Major depressive disorder, recurrent, mild: Secondary | ICD-10-CM | POA: Diagnosis not present

## 2024-03-02 ENCOUNTER — Telehealth (HOSPITAL_BASED_OUTPATIENT_CLINIC_OR_DEPARTMENT_OTHER): Payer: Self-pay

## 2024-03-03 ENCOUNTER — Ambulatory Visit: Admitting: Podiatry

## 2024-03-04 ENCOUNTER — Other Ambulatory Visit: Payer: Self-pay | Admitting: Family

## 2024-03-04 ENCOUNTER — Other Ambulatory Visit (HOSPITAL_BASED_OUTPATIENT_CLINIC_OR_DEPARTMENT_OTHER): Payer: Self-pay

## 2024-03-04 MED ORDER — ESOMEPRAZOLE MAGNESIUM 40 MG PO CPDR
40.0000 mg | DELAYED_RELEASE_CAPSULE | Freq: Every day | ORAL | 3 refills | Status: DC
  Filled 2024-03-04 – 2024-03-05 (×2): qty 90, 90d supply, fill #0
  Filled 2024-06-24 – 2024-07-01 (×2): qty 90, 90d supply, fill #1

## 2024-03-05 ENCOUNTER — Other Ambulatory Visit (HOSPITAL_BASED_OUTPATIENT_CLINIC_OR_DEPARTMENT_OTHER): Payer: Self-pay

## 2024-03-07 ENCOUNTER — Encounter (INDEPENDENT_AMBULATORY_CARE_PROVIDER_SITE_OTHER): Payer: Self-pay

## 2024-03-08 DIAGNOSIS — F330 Major depressive disorder, recurrent, mild: Secondary | ICD-10-CM | POA: Diagnosis not present

## 2024-03-09 ENCOUNTER — Ambulatory Visit: Admitting: Podiatry

## 2024-03-14 DIAGNOSIS — F330 Major depressive disorder, recurrent, mild: Secondary | ICD-10-CM | POA: Diagnosis not present

## 2024-03-17 ENCOUNTER — Other Ambulatory Visit (HOSPITAL_BASED_OUTPATIENT_CLINIC_OR_DEPARTMENT_OTHER): Payer: Self-pay

## 2024-03-17 ENCOUNTER — Encounter: Payer: Self-pay | Admitting: Family

## 2024-03-17 ENCOUNTER — Other Ambulatory Visit: Payer: Self-pay

## 2024-03-17 DIAGNOSIS — F339 Major depressive disorder, recurrent, unspecified: Secondary | ICD-10-CM | POA: Diagnosis not present

## 2024-03-17 DIAGNOSIS — F909 Attention-deficit hyperactivity disorder, unspecified type: Secondary | ICD-10-CM | POA: Diagnosis not present

## 2024-03-17 DIAGNOSIS — F411 Generalized anxiety disorder: Secondary | ICD-10-CM | POA: Diagnosis not present

## 2024-03-17 DIAGNOSIS — G43909 Migraine, unspecified, not intractable, without status migrainosus: Secondary | ICD-10-CM

## 2024-03-17 MED ORDER — AMPHETAMINE-DEXTROAMPHETAMINE 20 MG PO TABS
20.0000 mg | ORAL_TABLET | Freq: Two times a day (BID) | ORAL | 0 refills | Status: DC
  Filled 2024-05-29: qty 60, 30d supply, fill #0

## 2024-03-17 MED ORDER — BUSPIRONE HCL 30 MG PO TABS
30.0000 mg | ORAL_TABLET | Freq: Two times a day (BID) | ORAL | 3 refills | Status: AC
  Filled 2024-03-17 – 2024-04-13 (×4): qty 60, 30d supply, fill #0
  Filled 2024-05-19: qty 60, 30d supply, fill #1
  Filled 2024-06-24 – 2024-07-24 (×2): qty 60, 30d supply, fill #2
  Filled 2024-08-26: qty 60, 30d supply, fill #3

## 2024-03-17 MED ORDER — DULOXETINE HCL 30 MG PO CPEP
90.0000 mg | ORAL_CAPSULE | Freq: Every day | ORAL | 3 refills | Status: DC
  Filled 2024-03-17 – 2024-04-21 (×2): qty 90, 30d supply, fill #0
  Filled 2024-05-19: qty 90, 30d supply, fill #1
  Filled 2024-06-24 – 2024-07-24 (×2): qty 90, 30d supply, fill #2
  Filled 2024-08-22: qty 90, 30d supply, fill #3

## 2024-03-17 MED ORDER — AMPHETAMINE-DEXTROAMPHETAMINE 20 MG PO TABS
20.0000 mg | ORAL_TABLET | Freq: Two times a day (BID) | ORAL | 0 refills | Status: DC
  Filled 2024-03-31: qty 60, 30d supply, fill #0

## 2024-03-17 MED ORDER — PRAMIPEXOLE DIHYDROCHLORIDE 1.5 MG PO TABS
1.5000 mg | ORAL_TABLET | Freq: Every day | ORAL | 3 refills | Status: AC
  Filled 2024-03-17: qty 30, 30d supply, fill #0
  Filled 2024-08-22: qty 30, 30d supply, fill #1

## 2024-03-17 MED ORDER — AMPHETAMINE-DEXTROAMPHETAMINE 20 MG PO TABS
20.0000 mg | ORAL_TABLET | Freq: Two times a day (BID) | ORAL | 0 refills | Status: DC
  Filled 2024-04-28: qty 60, 30d supply, fill #0

## 2024-03-17 MED ORDER — TRAZODONE HCL 100 MG PO TABS
100.0000 mg | ORAL_TABLET | Freq: Every day | ORAL | 2 refills | Status: AC
  Filled 2024-03-17 – 2024-04-04 (×4): qty 60, 30d supply, fill #0
  Filled 2024-05-03: qty 60, 30d supply, fill #1
  Filled 2024-06-01 – 2024-06-02 (×2): qty 60, 30d supply, fill #2

## 2024-03-18 ENCOUNTER — Other Ambulatory Visit: Payer: Self-pay

## 2024-03-18 ENCOUNTER — Other Ambulatory Visit (HOSPITAL_BASED_OUTPATIENT_CLINIC_OR_DEPARTMENT_OTHER): Payer: Self-pay

## 2024-03-19 ENCOUNTER — Other Ambulatory Visit (HOSPITAL_BASED_OUTPATIENT_CLINIC_OR_DEPARTMENT_OTHER): Payer: Self-pay

## 2024-03-22 DIAGNOSIS — F330 Major depressive disorder, recurrent, mild: Secondary | ICD-10-CM | POA: Diagnosis not present

## 2024-03-23 ENCOUNTER — Other Ambulatory Visit (HOSPITAL_BASED_OUTPATIENT_CLINIC_OR_DEPARTMENT_OTHER): Payer: Self-pay

## 2024-03-24 ENCOUNTER — Other Ambulatory Visit: Payer: Self-pay | Admitting: Family

## 2024-03-24 ENCOUNTER — Other Ambulatory Visit (HOSPITAL_BASED_OUTPATIENT_CLINIC_OR_DEPARTMENT_OTHER): Payer: Self-pay

## 2024-03-24 DIAGNOSIS — Z1231 Encounter for screening mammogram for malignant neoplasm of breast: Secondary | ICD-10-CM

## 2024-03-25 DIAGNOSIS — F330 Major depressive disorder, recurrent, mild: Secondary | ICD-10-CM | POA: Diagnosis not present

## 2024-03-28 DIAGNOSIS — F330 Major depressive disorder, recurrent, mild: Secondary | ICD-10-CM | POA: Diagnosis not present

## 2024-03-29 ENCOUNTER — Telehealth: Payer: Self-pay

## 2024-03-29 ENCOUNTER — Ambulatory Visit (HOSPITAL_BASED_OUTPATIENT_CLINIC_OR_DEPARTMENT_OTHER)
Admission: RE | Admit: 2024-03-29 | Discharge: 2024-03-29 | Disposition: A | Discharge status: Home / Self Care | Source: Ambulatory Visit

## 2024-03-29 ENCOUNTER — Encounter (HOSPITAL_BASED_OUTPATIENT_CLINIC_OR_DEPARTMENT_OTHER): Payer: Self-pay | Admitting: Radiology

## 2024-03-29 DIAGNOSIS — Z1231 Encounter for screening mammogram for malignant neoplasm of breast: Secondary | ICD-10-CM | POA: Insufficient documentation

## 2024-03-29 NOTE — Telephone Encounter (Signed)
 Copied from CRM [id number]. Topic: General - Other >> Mar 29, 2024  8:00 AM [name] wrote: Reason for CRM: patient is needing to shingles her second shingles shot but she is not sure if she is suppose to wait two months or three months she would like a call back regarding this

## 2024-03-29 NOTE — Telephone Encounter (Signed)
 Called but no answer, lvm first shingles done 3/26 next one needed 2 to 6 months after first one. Patient advised to call for appointment to be scheduled as NV or ov after 5/26 for second shingles.

## 2024-03-31 ENCOUNTER — Other Ambulatory Visit (HOSPITAL_BASED_OUTPATIENT_CLINIC_OR_DEPARTMENT_OTHER): Payer: Self-pay

## 2024-04-01 ENCOUNTER — Telehealth: Admitting: Physician Assistant

## 2024-04-01 DIAGNOSIS — F330 Major depressive disorder, recurrent, mild: Secondary | ICD-10-CM | POA: Diagnosis not present

## 2024-04-01 DIAGNOSIS — M549 Dorsalgia, unspecified: Secondary | ICD-10-CM

## 2024-04-01 MED ORDER — CYCLOBENZAPRINE HCL 10 MG PO TABS
5.0000 mg | ORAL_TABLET | Freq: Three times a day (TID) | ORAL | 0 refills | Status: DC | PRN
  Filled 2024-04-01: qty 30, 10d supply, fill #0

## 2024-04-01 NOTE — Progress Notes (Signed)

## 2024-04-02 ENCOUNTER — Other Ambulatory Visit (HOSPITAL_BASED_OUTPATIENT_CLINIC_OR_DEPARTMENT_OTHER): Payer: Self-pay

## 2024-04-04 ENCOUNTER — Other Ambulatory Visit (HOSPITAL_BASED_OUTPATIENT_CLINIC_OR_DEPARTMENT_OTHER): Payer: Self-pay

## 2024-04-04 ENCOUNTER — Other Ambulatory Visit: Payer: Self-pay

## 2024-04-07 ENCOUNTER — Encounter: Payer: Self-pay | Admitting: Podiatry

## 2024-04-11 ENCOUNTER — Encounter (INDEPENDENT_AMBULATORY_CARE_PROVIDER_SITE_OTHER): Payer: Self-pay | Admitting: Adult Health

## 2024-04-11 ENCOUNTER — Ambulatory Visit (INDEPENDENT_AMBULATORY_CARE_PROVIDER_SITE_OTHER): Admitting: Adult Health

## 2024-04-11 VITALS — BP 120/78 | HR 99 | Temp 98.0°F | Ht 67.0 in | Wt 240.0 lb

## 2024-04-11 DIAGNOSIS — E669 Obesity, unspecified: Secondary | ICD-10-CM | POA: Diagnosis not present

## 2024-04-11 DIAGNOSIS — Z0000 Encounter for general adult medical examination without abnormal findings: Secondary | ICD-10-CM | POA: Diagnosis not present

## 2024-04-11 DIAGNOSIS — E559 Vitamin D deficiency, unspecified: Secondary | ICD-10-CM

## 2024-04-11 DIAGNOSIS — K219 Gastro-esophageal reflux disease without esophagitis: Secondary | ICD-10-CM

## 2024-04-11 DIAGNOSIS — F4323 Adjustment disorder with mixed anxiety and depressed mood: Secondary | ICD-10-CM | POA: Diagnosis not present

## 2024-04-11 NOTE — Progress Notes (Signed)
 Office: [phone]  /  Fax: [phone]   Initial Visit  Christine REWIS was seen in clinic today to evaluate for obesity. She is interested in losing weight to improve overall health and reduce the risk of weight related complications. She presents today to review program treatment options, initial physical assessment, and evaluation.     Discussed the use of AI scribe software for clinical note transcription with the patient, who gave verbal consent to proceed.  History of Present Illness        Peak weight 320 lbs at time of Gastric Bypass Lost down to 160 lbs  Currently 240 lbs  She was referred by: Friend or Family  When asked what else they would like to accomplish? She states: Adopt healthier eating patterns, Improve energy levels and physical activity, Improve existing medical conditions, Improve quality of life, and Current Weight 240 lbs, goal weight 160 lbs  When asked how has your weight affected you? She states: Contributed to medical problems, Contributed to orthopedic problems or mobility issues, Having fatigue, Having poor endurance, and Problems with eating patterns  Weight history: Lifelong challenge with elevated BMI Peak weight 320 lbs at time of Gastric Bypass Lost down to 160 lbs  Currently 240 lbs  Some associated conditions: Hypertension, GERD, and Vitamin D  Deficiency  Contributing factors: Family history of obesity, Disruption of circadian rhythm / sleep disordered breathing, Consumption of processed foods, Use of obesogenic medications: Psychotropic medications, Moderate to high levels of stress, and Menopause  Weight promoting medications identified: Psychotropic medications  Current nutrition plan: None  Current level of physical activity: None  Current or previous pharmacotherapy: Phentermine  Response to medication: Lost weight initially but was unable to sustain weight loss   Past medical history includes:   Past Medical History:   Diagnosis Date   ADHD (attention deficit hyperactivity disorder)    GAD (generalized anxiety disorder)    GERD (gastroesophageal reflux disease)    Headache(784.0)    History of gastric ulcer    partial ulcer perferation gastrojujunal anastomosis  post gastric bypass  -- s/p  surgical repair 2012 and 2013   History of palpitations in adulthood    per pt had occasional palpitations ,  told had PVCs   History of seizures as a child    febrile   IDA (iron  deficiency anemia)    Insomnia    Irritable bowel syndrome with diarrhea    MDD (major depressive disorder)    PONV (postoperative nausea and vomiting)    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  Peak Weight:320 , Body Fat%:46.7, Visceral Fat Rating:13, Weight trend over the last 12 months: Increasing  General:  Alert, oriented and cooperative. Patient is in no acute distress.  Respiratory: Normal respiratory effort, no problems with respiration noted   Gait: able to ambulate independently  Mental Status: Normal mood and affect. Normal behavior. Normal judgment and thought content.  DIAGNOSTIC DATA REVIEWED:  BMET    Component Value Date/Time   NA 139 12/19/2011 0945   K 4.0 12/19/2011 0945   CL 104 12/19/2011 0945   CO2 27 12/19/2011 0945   GLUCOSE 78 12/19/2011 0945   BUN 16 12/19/2011 0945   CREATININE 0.54 12/22/2011 1415   CALCIUM 9.1 12/19/2011 0945   GFRNONAA >90 12/22/2011 1415   GFRAA >90 12/22/2011 1415   Lab Results  Component Value Date   HGBA1C 5.2 02/17/2024   No results found for: "INSULIN" CBC     Component Value Date/Time   WBC 6.9 02/17/2024 1015   RBC 4.32 02/17/2024 1015   HGB 13.4 02/17/2024 1015   HCT 40.1 02/17/2024 1015   PLT 323.0 02/17/2024 1015   MCV 92.9 02/17/2024 1015   MCH 30.8 11/09/2023 0915   MCHC 33.4 02/17/2024 1015   RDW 13.3 02/17/2024 1015   Iron /TIBC/Ferritin/ %Sat    Component Value Date/Time   IRON  60 02/17/2024 1015   TIBC 376.6 02/17/2024 1015   FERRITIN 8 (L) 02/13/2023 0936   IRONPCTSAT 15.9 (L) 02/17/2024 1015   IRONPCTSAT 33 02/13/2023 0936   Lipid Panel     Component Value Date/Time   CHOL 159 02/13/2023 0936   TRIG 144.0 02/13/2023 0936   HDL 61.60 02/13/2023 0936   CHOLHDL 3 02/13/2023 0936   VLDL 28.8 02/13/2023 0936   LDLCALC 69 02/13/2023 0936   Hepatic Function Panel     Component Value Date/Time   PROT 7.0 12/19/2011 0945   ALBUMIN  3.4 (L) 12/19/2011 0945   AST 13 12/19/2011 0945   ALT 13 12/19/2011 0945   ALKPHOS 94 12/19/2011 0945   BILITOT 0.3 12/19/2011 0945      Component Value Date/Time   TSH 1.49 02/13/2023 0936     Assessment and Plan:   Healthcare maintenance  Vitamin D  deficiency  Morbid obesity (HCC)  Gastroesophageal reflux disease, unspecified whether esophagitis present  Obesity (BMI 30-39.9), STARTING BMI 37.58    Assessment and Plan         ESTABLISH WITH HWW   Obesity Treatment / Action Plan:  Patient will work on garnering support from family and friends to begin weight loss journey. Will work on eliminating or reducing the presence of highly palatable, calorie dense foods in the home. Will complete provided nutritional and psychosocial assessment questionnaire before the next appointment. Will be scheduled for indirect calorimetry to determine resting energy expenditure in a fasting state.  This will allow us  to create a reduced calorie, high-protein meal plan to promote loss of fat mass while preserving muscle mass. Counseled on the health benefits of losing 5%-15% of total  body weight. Was counseled on nutritional approaches to weight loss and benefits of reducing processed foods and consuming plant-based foods and high quality protein as part of nutritional weight management. Was counseled on pharmacotherapy and role as an adjunct in weight management.   Obesity Education Performed Today:  She was weighed on the bioimpedance scale and results were discussed and documented in the synopsis.  We discussed obesity as a disease and the importance of a more detailed evaluation of all the factors contributing to the disease.  We discussed the importance of long term lifestyle changes which include nutrition, exercise and behavioral modifications as well as the importance of customizing this to her specific health and social needs.  We discussed the benefits of reaching a healthier weight to alleviate the symptoms of existing conditions and reduce the risks of the  biomechanical, metabolic and psychological effects of obesity.  Hollyann L Troeger appears to be in the action stage of change and states they are ready to start intensive lifestyle modifications and behavioral modifications.  I have spent 40 minutes in the care of the patient today including: preparing to see patient (e.g. review and interpretation of tests, old notes ), obtaining and/or reviewing separately obtained history, performing a medically appropriate examination or evaluation, counseling and educating the patient, documenting clinical information in the electronic or other health care record, and independently interpreting results and communicating results to the patient, family, or caregiver   Reviewed by clinician on day of visit: allergies, medications, problem list, medical history, surgical history, family history, social history, and previous encounter notes pertinent to obesity diagnosis.  Shayda Kalka d. Lonnel Gjerde, NP-C

## 2024-04-13 ENCOUNTER — Other Ambulatory Visit (HOSPITAL_BASED_OUTPATIENT_CLINIC_OR_DEPARTMENT_OTHER): Payer: Self-pay

## 2024-04-13 DIAGNOSIS — Z0289 Encounter for other administrative examinations: Secondary | ICD-10-CM

## 2024-04-19 ENCOUNTER — Other Ambulatory Visit (HOSPITAL_BASED_OUTPATIENT_CLINIC_OR_DEPARTMENT_OTHER): Payer: Self-pay

## 2024-04-22 ENCOUNTER — Ambulatory Visit (INDEPENDENT_AMBULATORY_CARE_PROVIDER_SITE_OTHER): Admitting: *Deleted

## 2024-04-22 ENCOUNTER — Other Ambulatory Visit (HOSPITAL_BASED_OUTPATIENT_CLINIC_OR_DEPARTMENT_OTHER): Payer: Self-pay

## 2024-04-22 ENCOUNTER — Other Ambulatory Visit: Payer: Self-pay

## 2024-04-22 DIAGNOSIS — Z23 Encounter for immunization: Secondary | ICD-10-CM | POA: Diagnosis not present

## 2024-04-22 DIAGNOSIS — F330 Major depressive disorder, recurrent, mild: Secondary | ICD-10-CM | POA: Diagnosis not present

## 2024-04-22 NOTE — Progress Notes (Signed)
 Patient here for 2nd shingles vaccine per pcp.  Vaccine given in left deltoid and patient tolerated well.

## 2024-04-25 DIAGNOSIS — F330 Major depressive disorder, recurrent, mild: Secondary | ICD-10-CM | POA: Diagnosis not present

## 2024-04-25 DIAGNOSIS — F4323 Adjustment disorder with mixed anxiety and depressed mood: Secondary | ICD-10-CM | POA: Diagnosis not present

## 2024-04-27 ENCOUNTER — Other Ambulatory Visit (HOSPITAL_BASED_OUTPATIENT_CLINIC_OR_DEPARTMENT_OTHER): Payer: Self-pay

## 2024-04-29 ENCOUNTER — Other Ambulatory Visit: Payer: Self-pay

## 2024-04-29 ENCOUNTER — Other Ambulatory Visit (HOSPITAL_BASED_OUTPATIENT_CLINIC_OR_DEPARTMENT_OTHER): Payer: Self-pay

## 2024-04-29 DIAGNOSIS — F330 Major depressive disorder, recurrent, mild: Secondary | ICD-10-CM | POA: Diagnosis not present

## 2024-05-02 DIAGNOSIS — F330 Major depressive disorder, recurrent, mild: Secondary | ICD-10-CM | POA: Diagnosis not present

## 2024-05-03 ENCOUNTER — Other Ambulatory Visit (HOSPITAL_BASED_OUTPATIENT_CLINIC_OR_DEPARTMENT_OTHER): Payer: Self-pay

## 2024-05-03 ENCOUNTER — Other Ambulatory Visit: Payer: Self-pay | Admitting: Family

## 2024-05-03 ENCOUNTER — Other Ambulatory Visit: Payer: Self-pay

## 2024-05-03 MED ORDER — FAMOTIDINE 40 MG PO TABS
20.0000 mg | ORAL_TABLET | Freq: Two times a day (BID) | ORAL | 5 refills | Status: DC
  Filled 2024-05-03: qty 60, 60d supply, fill #0
  Filled 2024-06-24 – 2024-07-01 (×2): qty 60, 60d supply, fill #1
  Filled 2024-08-16 – 2024-08-26 (×2): qty 60, 60d supply, fill #2

## 2024-05-04 ENCOUNTER — Telehealth: Payer: Self-pay

## 2024-05-04 ENCOUNTER — Other Ambulatory Visit (HOSPITAL_BASED_OUTPATIENT_CLINIC_OR_DEPARTMENT_OTHER): Payer: Self-pay

## 2024-05-04 ENCOUNTER — Other Ambulatory Visit (HOSPITAL_COMMUNITY): Payer: Self-pay

## 2024-05-04 NOTE — Telephone Encounter (Signed)
 Pharmacy Patient Advocate Encounter   Received notification from Patient Pharmacy that prior authorization for Nurtec 75 is required/requested.   Insurance verification completed.   The patient is insured through [facility] .   Per test claim: PA required; PA submitted to above mentioned insurance via CoverMyMeds Key/confirmation #/EOC [id number] Status is pending

## 2024-05-06 ENCOUNTER — Other Ambulatory Visit (HOSPITAL_COMMUNITY): Payer: Self-pay

## 2024-05-06 ENCOUNTER — Other Ambulatory Visit (HOSPITAL_BASED_OUTPATIENT_CLINIC_OR_DEPARTMENT_OTHER): Payer: Self-pay

## 2024-05-09 ENCOUNTER — Other Ambulatory Visit (HOSPITAL_COMMUNITY): Payer: Self-pay

## 2024-05-09 NOTE — Telephone Encounter (Signed)
 Pharmacy Patient Advocate Encounter  Received notification from [employer] that Prior Authorization for Nurtec 75 has been APPROVED from 05/05/24 to 11/01/24. Unable to obtain price due to refill too soon rejection, last fill date 05/06/24 next available fill date7/7/25   PA #/Case ID/Reference #: [id number]

## 2024-05-11 NOTE — Telephone Encounter (Signed)
 See if she can go to [facility] for PT.

## 2024-05-13 DIAGNOSIS — F330 Major depressive disorder, recurrent, mild: Secondary | ICD-10-CM | POA: Diagnosis not present

## 2024-05-16 ENCOUNTER — Other Ambulatory Visit (HOSPITAL_BASED_OUTPATIENT_CLINIC_OR_DEPARTMENT_OTHER): Payer: Self-pay

## 2024-05-19 ENCOUNTER — Other Ambulatory Visit: Payer: Self-pay

## 2024-05-23 ENCOUNTER — Encounter (INDEPENDENT_AMBULATORY_CARE_PROVIDER_SITE_OTHER): Payer: Self-pay

## 2024-05-23 ENCOUNTER — Other Ambulatory Visit: Payer: Self-pay

## 2024-05-24 ENCOUNTER — Other Ambulatory Visit (HOSPITAL_BASED_OUTPATIENT_CLINIC_OR_DEPARTMENT_OTHER): Payer: Self-pay

## 2024-05-24 ENCOUNTER — Ambulatory Visit (INDEPENDENT_AMBULATORY_CARE_PROVIDER_SITE_OTHER): Admitting: Obstetrics & Gynecology

## 2024-05-24 ENCOUNTER — Encounter: Payer: Self-pay | Admitting: Obstetrics & Gynecology

## 2024-05-24 VITALS — BP 137/98 | HR 90 | Wt 250.0 lb

## 2024-05-24 DIAGNOSIS — N951 Menopausal and female climacteric states: Secondary | ICD-10-CM

## 2024-05-24 MED ORDER — ESTRADIOL 0.05 MG/24HR TD PTWK
0.0500 mg | MEDICATED_PATCH | TRANSDERMAL | 12 refills | Status: DC
  Filled 2024-05-24: qty 4, 28d supply, fill #0

## 2024-05-24 NOTE — Progress Notes (Signed)
 GYNECOLOGY OFFICE VISIT NOTE  History:  [name] is a 48 y.o. [id number] here today for discussion about initiation of HRT for bothersome vasomotor symptoms. Had RATH in 2025. She denies any abnormal vaginal discharge, bleeding, pelvic pain or other concerns.  Past Medical History:  Diagnosis Date   ADHD (attention deficit hyperactivity disorder)    GAD (generalized anxiety disorder)    GERD (gastroesophageal reflux disease)    Headache(784.0)    History of gastric ulcer    partial ulcer perferation gastrojujunal anastomosis  post gastric bypass  -- s/p  surgical repair 2012 and 2013   History of palpitations in adulthood    per pt had occasional palpitations ,  told had PVCs   History of seizures as a child    febrile   IDA (iron  deficiency anemia)    Insomnia    Irritable bowel syndrome with diarrhea    MDD (major depressive disorder)    PONV (postoperative nausea and vomiting)    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/  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;   anastomtic ulcer s/p bypass s/p repair x2 2012 and 2013;   SUI (stress urinary incontinence, female)    Wears glasses     Past Surgical History:  Procedure Laterality Date   BLADDER SUSPENSION N/A 12/09/2023   Procedure: TRANSVAGINAL TAPE (TVT) PROCEDURE;  Surgeon: Marilynne Rosaline SAILOR, MD;  Location: [facility];  Service: Gynecology;  Laterality: N/A;   COLONOSCOPY WITH PROPOFOL   2023   CYSTOSCOPY N/A 12/09/2023   Procedure: CYSTOSCOPY;  Surgeon: Marilynne Rosaline SAILOR, MD;  Location: [facility];  Service: Gynecology;  Laterality: N/A;   DILITATION & CURRETTAGE/HYSTROSCOPY WITH ESSURE  2015   bilateral tubal sterilization with essure   ESOPHAGOGASTRODUODENOSCOPY  11/28/2011   Procedure: ESOPHAGOGASTRODUODENOSCOPY (EGD);  Surgeon: Belvie JONETTA Just, MD;  Location: [facility];  Service: Endoscopy;  Laterality: N/A;   LAPAROSCOPIC CHOLECYSTECTOMY  2006    LAPAROSCOPIC GASTROTOMY W/ REPAIR OF ULCER  10/23/2009   @[facility] ;   by dr s. gross;  EXTENSIVE LYSIS ADHESIONS/  OMENTAL PATCH OF ULCER   LAPAROSCOPY ABDOMEN DIAGNOSTIC  02/21/2011   @[facility]  by dr forbes. wilson/ dr b. sebastian;  upper endoscopy/  LAPAROSCOPIC OMENTAL PATCH REPAIR GASTROJEJUNAL ANASTOMTIC PERFORATION / PLACEMENT DRAIN   ROBOTIC ASSISTED LAPAROSCOPIC OVARIAN CYSTECTOMY Right 12/09/2023   Procedure: XI ROBOTIC ASSISTED LAPAROSCOPIC OVARIAN CYSTECTOMY;  Surgeon: Marilynne Rosaline SAILOR, MD;  Location: [facility];  Service: Gynecology;  Laterality: Right;   ROUX-EN-Y GASTRIC BYPASS  2005   @ [facility] by dr sherlean sous;  laparoscopic   VENTRAL HERNIA REPAIR  12/22/2011   Procedure: HERNIA REPAIR VENTRAL ADULT;  Surgeon: Donnice KATHEE Lunger, MD;  Location: [facility];  Service: General;;  RESECTION/ RE-DO  OF PRIOR GASTROJEJUNOSTOMY (GASTRIC BYPASS REVISION)   XI ROBOTIC ASSISTED SMALL BOWEL RESECTION  06/05/2022   @ [facility] by dr shaunna. chandler;  for intussusception intestine   XI ROBOTIC ASSISTED TOTAL HYSTERECTOMY WITH SACROCOLPOPEXY N/A 12/09/2023   Procedure: XI ROBOTIC ASSISTED TOTAL HYSTERECTOMY WITH BILATERAL SALPINGECTOMY AND SACROCOLPOPEXY;  Surgeon: Marilynne Rosaline SAILOR, MD;  Location: [facility];  Service: Gynecology;  Laterality: N/A;   The following portions of the patient's history were reviewed and updated as appropriate: allergies, current medications, past family history, past medical history, past social history, past surgical history and problem list.    Review of Systems:  Pertinent items noted  in HPI and remainder of comprehensive ROS otherwise negative.  Physical Exam:  BP (!) 137/98 (Cuff Size: Normal)   Pulse 90   Wt 250 lb (113.4 kg)   BMI 39.16 kg/m  CONSTITUTIONAL: Well-developed, well-nourished female in no acute distress.  SKIN: No rash noted. Not diaphoretic. No erythema. No pallor. MUSCULOSKELETAL: Normal range of motion. No edema  noted. NEUROLOGIC: Alert and oriented to person, place, and time. Normal muscle tone coordination. No cranial nerve deficit noted on observation. PSYCHIATRIC: Normal mood and affect. Normal behavior. Normal judgment and thought content. CARDIOVASCULAR: Normal heart rate noted RESPIRATORY: Effort and breath sounds normal, no problems with respiration noted ABDOMEN: No masses or other overt distention noted on observation. No tenderness.   PELVIC: Deferred   Assessment and Plan:    1. Menopausal symptoms (Primary) Patient with bothersome menopausal vasomotor symptoms.Already doing lifestyle interventions.  Discussed using hormone therapy and concerns about increased risk of heart disease, cerebrovascular disease, thromboembolic disease  and breast cancer.   There is also a new nonhormonal medication called Veozah, which is FDA-approved for menopausal vasomotor symptoms.  Patient opted for transdermal estrogen therapy, Climara 0.05 mg/24 hr patch prescribed.  Will increase to 0.1 mg if no alleviation of symptoms. She will return in 2 months for reevaluation. - estradiol (CLIMARA - DOSED IN MG/24 HR) 0.05 mg/24hr patch; Place 1 patch (0.05 mg total) onto the skin once a week.  Dispense: 4 patch; Refill: 12   Routine preventative health maintenance measures emphasized. Please refer to After Visit Summary for other counseling recommendations.   Return in about 2 months (around 07/25/2024) for HRT follow up (can be office or virtual).    I spent 25 minutes dedicated to the care of this patient including pre-visit review of records, face to face time with the patient discussing her conditions and treatments, post visit ordering of medications and appropriate tests or procedures, coordinating care and documenting this visit encounter.    GLORIS HUGGER, MD, FACOG Obstetrician & Gynecologist, [facility] [employer], [facility]

## 2024-05-26 DIAGNOSIS — F330 Major depressive disorder, recurrent, mild: Secondary | ICD-10-CM | POA: Diagnosis not present

## 2024-05-28 ENCOUNTER — Encounter (INDEPENDENT_AMBULATORY_CARE_PROVIDER_SITE_OTHER): Payer: Self-pay

## 2024-05-30 ENCOUNTER — Other Ambulatory Visit (HOSPITAL_BASED_OUTPATIENT_CLINIC_OR_DEPARTMENT_OTHER): Payer: Self-pay

## 2024-05-31 DIAGNOSIS — F419 Anxiety disorder, unspecified: Secondary | ICD-10-CM | POA: Diagnosis not present

## 2024-05-31 DIAGNOSIS — F330 Major depressive disorder, recurrent, mild: Secondary | ICD-10-CM | POA: Diagnosis not present

## 2024-06-01 ENCOUNTER — Other Ambulatory Visit (HOSPITAL_BASED_OUTPATIENT_CLINIC_OR_DEPARTMENT_OTHER): Payer: Self-pay

## 2024-06-01 ENCOUNTER — Telehealth

## 2024-06-01 DIAGNOSIS — H60392 Other infective otitis externa, left ear: Secondary | ICD-10-CM

## 2024-06-01 MED ORDER — CIPROFLOXACIN-DEXAMETHASONE 0.3-0.1 % OT SUSP
4.0000 [drp] | Freq: Two times a day (BID) | OTIC | 0 refills | Status: DC
  Filled 2024-06-01: qty 7.5, 19d supply, fill #0

## 2024-06-01 NOTE — Progress Notes (Signed)
 E Visit for Ear Pain - Otitis Externa  We are sorry that you are not feeling well. Here is how we plan to help!  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  Swimmer's ear is a redness or swelling, irritation, or infection of your outer ear canal. These symptoms usually occur within a few days of swimming. Your ear canal is a tube that goes from the opening of the ear to the eardrum.  When water  stays in your ear canal, germs can grow.  This is a painful condition that often happens to children and swimmers of all ages.  It is not contagious and oral antibiotics are not required to treat uncomplicated swimmer's ear.  The usual symptoms include:    Itchiness inside the ear  Redness or a sense of swelling in the ear  Pain when the ear is tugged on when pressure is placed on the ear  Pus draining from the infected ear   I have prescribed: Ciprofloxacin  0.3% and dexamethasone  0.1% otic suspension four drops in affected ears two times a day for 7 days  In certain cases, swimmer's ear may progress to a more serious bacterial infection of the middle or inner ear.  If you have a fever 102 and up and significantly worsening symptoms, this could indicate a more serious infection moving to the middle/inner and needs face to face evaluation in an office by a provider.  Your symptoms should improve over the next 3 days and should resolve in about 7 days.  Be sure to complete ALL of your prescription.  HOME CARE: Wash your hands frequently. If you are prescribed an ear drop, do not place the tip of the bottle on your ear or touch it with your fingers. You can take Acetaminophen  650 mg every 4-6 hours as needed for pain.  If pain is severe or moderate, you can apply a heating pad (set on low) or hot water  bottle (wrapped in a towel) to outer ear for 20 minutes.  This will also increase drainage. Avoid ear plugs Do not go swimming until the symptoms are gone Do not use  Q-tips After showers, help the water  run out by tilting your head to one side.   GET HELP RIGHT AWAY IF: Fever is over 102.2 degrees. You develop progressive ear pain or hearing loss. Ear symptoms persist longer than 3 days after treatment.  MAKE SURE YOU: Understand these instructions. Will watch your condition. Will get help right away if you are not doing well or get worse.  TO PREVENT SWIMMER'S EAR: Use a bathing cap or custom fitted swim molds to keep your ears dry. Towel off after swimming to dry your ears. Tilt your head or pull your earlobes to allow the water  to escape your ear canal. If there is still water  in your ears, consider using a hairdryer on the lowest setting.  Thank you for choosing an e-visit.  Your e-visit answers were reviewed by a board certified advanced clinical practitioner to complete your personal care plan. Depending upon the condition, your plan could have included both over the counter or prescription medications.  Please review your pharmacy choice. Make sure the pharmacy is open so you can pick up the prescription now. If there is a problem, you may contact your provider through [employer] and have the prescription routed to another pharmacy.  Your safety is important to us . If you have drug allergies check your prescription carefully.  For the next 24 hours you can use MyChart to ask questions about today's visit, request a non-urgent call back, or ask for a work or school excuse. You will get an email with a survey after your eVisit asking about your experience. We would appreciate your feedback. I hope that your e-visit has been valuable and will aid in your recovery.   I have spent 5 minutes in review of e-visit questionnaire, review and updating patient chart, medical decision making and response to patient.   Delon CHRISTELLA Dickinson, PA-C

## 2024-06-02 ENCOUNTER — Ambulatory Visit: Payer: Self-pay

## 2024-06-02 ENCOUNTER — Ambulatory Visit (INDEPENDENT_AMBULATORY_CARE_PROVIDER_SITE_OTHER): Admitting: Family Medicine

## 2024-06-02 ENCOUNTER — Encounter (INDEPENDENT_AMBULATORY_CARE_PROVIDER_SITE_OTHER): Payer: Self-pay | Admitting: Family Medicine

## 2024-06-02 ENCOUNTER — Other Ambulatory Visit: Payer: Self-pay

## 2024-06-02 ENCOUNTER — Other Ambulatory Visit (HOSPITAL_BASED_OUTPATIENT_CLINIC_OR_DEPARTMENT_OTHER): Payer: Self-pay

## 2024-06-02 VITALS — BP 109/75 | HR 83 | Temp 98.0°F | Ht 67.0 in | Wt 244.0 lb

## 2024-06-02 DIAGNOSIS — E162 Hypoglycemia, unspecified: Secondary | ICD-10-CM | POA: Diagnosis not present

## 2024-06-02 DIAGNOSIS — R0609 Other forms of dyspnea: Secondary | ICD-10-CM

## 2024-06-02 DIAGNOSIS — R5383 Other fatigue: Secondary | ICD-10-CM

## 2024-06-02 DIAGNOSIS — I10 Essential (primary) hypertension: Secondary | ICD-10-CM

## 2024-06-02 DIAGNOSIS — E559 Vitamin D deficiency, unspecified: Secondary | ICD-10-CM | POA: Diagnosis not present

## 2024-06-02 DIAGNOSIS — Z9884 Bariatric surgery status: Secondary | ICD-10-CM | POA: Diagnosis not present

## 2024-06-02 DIAGNOSIS — E538 Deficiency of other specified B group vitamins: Secondary | ICD-10-CM

## 2024-06-02 DIAGNOSIS — Z6838 Body mass index (BMI) 38.0-38.9, adult: Secondary | ICD-10-CM

## 2024-06-02 DIAGNOSIS — Z1331 Encounter for screening for depression: Secondary | ICD-10-CM | POA: Diagnosis not present

## 2024-06-02 DIAGNOSIS — E669 Obesity, unspecified: Secondary | ICD-10-CM

## 2024-06-02 DIAGNOSIS — R739 Hyperglycemia, unspecified: Secondary | ICD-10-CM

## 2024-06-02 NOTE — Telephone Encounter (Signed)
 Looks like meds have been added back to med list.

## 2024-06-02 NOTE — Telephone Encounter (Signed)
  No triage: Baker from [facility] was cleaning med list and accidentally deleted 4 current medications that each had refills.  Adderall 20mg ; Duloxetine  30mg ; Mirapex ; Trazodone  100mg .   Could you please correct mistake?    Copied from CRM [id number]. Topic: General - Other >> Jun 02, 2024 12:40 PM Berneda FALCON wrote: Reason for CRM: [facility] from [facility] called in and asked to speak to one of the nurses in regards to this patient's medication. Initially, I called the CAL, but was informed that all the nurses are at lunch right now, and the recommendation was to contact the [facility] line.  The only information I have is that this is in regards to this patient's medications.

## 2024-06-02 NOTE — Progress Notes (Signed)
 Office: [phone]  /  Fax: [phone]  WEIGHT SUMMARY AND BIOMETRICS  Anthropometric Measurements Height: 5' 7 (1.702 m) Weight: 244 lb (110.7 kg) BMI (Calculated): 38.21 Starting Weight: 244 lb Peak Weight: 320 lb Waist Measurement : 54 inches   Body Composition  Body Fat %: 48.4 % Fat Mass (lbs): 118.2 lbs Muscle Mass (lbs): 119.8 lbs Total Body Water  (lbs): 86.6 lbs Visceral Fat Rating : 14   Other Clinical Data Fasting: yes Labs: yes Today's Visit #: 1st Starting Date: 06/02/24 Comments: First Visit    Chief Complaint: OBESITY    History of Present Illness [name] is a 48 year old female who presents for evaluation and workup of her obesity diagnosis.  She has a history of Roux-en-Y gastric bypass in 2005, revised in 2013, and a partial revision with bowel resection in 2023. Her peak weight before surgery was 320 pounds, and she lost down to 144 pounds over five years. Weight gain began after her pregnancy and complications from the Roux-en-Y gastric bypass. She describes herself as an Surveyor, quantity and mentions having had 'lots of issues over the last few years,' including family and emotional stress.  She experiences fatigue and dyspnea on exertion. She has a history of B12 deficiency and vitamin D  deficiency, but there are no recent labs available for these conditions. Her current medication regimen includes a gummy multivitamin, calcium citrate, and Integra iron  supplement. She does not take a bariatric-specific multivitamin at this time.  She reports significant fluctuations in blood sugar levels, generally running low but sometimes experiencing highs. Her last A1c was noted to be unusually low at 4.  She works at the [employer] and [employer] and describes her work environment as having a break room full of unhealthy food options. Family history includes addiction, including drugs and alcohol.  She has filled out her new patient  paperwork, which was reviewed in its entirety. Pertinent positives are as follows:  Social History: She works full-time as an Charity fundraiser. She is married and lives with her spouse, her 90 year old daughter, and her 36 year old son. She notes that the family sometimes eats their meals together and feels that the family will be eating healthier with her and that they are supportive of her weight loss efforts. She has never smoked tobacco. She exercises occasionally, primarily by walking. She wears a fitness tracker and on workdays, she tends to get more than 8,000 steps a day. On non-work days, however, her step count is significantly lower, around 2,500.  Weight History: Her weight today is 244 pounds. Her desired weight is 160 pounds. Her heaviest weight was 320 pounds before she had Roux-en-Y gastric bypass. She lost all the way down to 144 pounds over five years but slowly started gaining some of her weight back after pregnancy, breastfeeding, and multiple surgeries. She has been overweight for most of her life and started gaining excessive weight more recently, around 2018. She feels that COVID-19, multiple stressors, and perimenopause have contributed to her weight gain. She has tried multiple programs such as [employer] in high school but was unable to keep off any weight that she lost.  Nutritional Evaluation: She eats outside the home nine times a week, mostly on workdays. She tends to consume fast food or takeout about ten times a week. She does the grocery shopping weekly but does not like to cook. She notes that she tends to crave sweets and has more food cravings late in the evening or very early  in the morning before she takes her Adderall for ADHD. She does not care for cauliflower, cooked carrots, Brussels sprouts, mashed potatoes, pudding, yogurt, or bananas. She tends to snack frequently between meals and in the evenings, often snacking on comfort foods such as cookies, sweets, fruits, carrot  sticks, cheese, crackers, and nuts. She sometimes wakes up in the middle of the night feeling hungry and needs to grab something to eat in order to get back to sleep. She skips lunch when she is at home 3 or 4 days per week. She is not trying to follow a vegetarian lifestyle. Her family makes eating healthy difficult, especially since her daughter is a very picky eater and both she and her husband have busy schedules. She notes that her children's food preferences frequently dictate what the family eats. She drinks regular soda 2 to 3 times a week. She tends to drink coffee with creamer and tea with sugar. She feels her worst food habits include eating high carbs frequently, snacking, consuming fast food, and eating before bed. She notes feeling that she has excess hunger and has a challenge with portion control. She sometimes eats until she is uncomfortably full or stuffed. She tends to eat quickly, within 20 minutes or less, while doing other things such as working or watching TV.   Mood and Food: She notes emotional eating behaviors, especially when she is stressed, sad, to help her stay awake, as a reward, when she is bored, when she feels guilty, and generally to help comfort herself. She sometimes feels guilty about overeating or making poor food choices and feels that she has some self-esteem issues. She grew up feeling judged about her weight and sometimes feels out of control with her eating. She often has an all-or-nothing tendency when it comes to healthy eating. She notes eating larger portions than the average person on a daily basis. She has never been diagnosed by a physician with an eating disorder, but she suspects she may have binge eating disorder.  Testing Results: She has a modified PQ-9 mood and food score that is elevated at 23. She has an Epworth Sleepiness Score that is positive at 19. She has an EKG that shows normal sinus rhythm at 85 beats per minute with one PVC. Her BMR was  calculated to be 1,794. Resting energy expenditure testing via indirect calorimetry is 1,757.      PHYSICAL EXAM:  Blood pressure 109/75, pulse 83, temperature 98 F (36.7 C), height 5' 7 (1.702 m), weight 244 lb (110.7 kg), SpO2 100%. Body mass index is 38.22 kg/m.  DIAGNOSTIC DATA REVIEWED:  BMET    Component Value Date/Time   NA 139 12/19/2011 0945   K 4.0 12/19/2011 0945   CL 104 12/19/2011 0945   CO2 27 12/19/2011 0945   GLUCOSE 78 12/19/2011 0945   BUN 16 12/19/2011 0945   CREATININE 0.54 12/22/2011 1415   CALCIUM 9.1 12/19/2011 0945   GFRNONAA >90 12/22/2011 1415   GFRAA >90 12/22/2011 1415   Lab Results  Component Value Date   HGBA1C 5.2 02/17/2024   No results found for: INSULIN Lab Results  Component Value Date   TSH 1.49 02/13/2023   CBC    Component Value Date/Time   WBC 6.9 02/17/2024 1015   RBC 4.32 02/17/2024 1015   HGB 13.4 02/17/2024 1015   HCT 40.1 02/17/2024 1015   PLT 323.0 02/17/2024 1015   MCV 92.9 02/17/2024 1015   MCH 30.8 11/09/2023 0915   MCHC  33.4 02/17/2024 1015   RDW 13.3 02/17/2024 1015   Iron  Studies    Component Value Date/Time   IRON  60 02/17/2024 1015   TIBC 376.6 02/17/2024 1015   FERRITIN 8 (L) 02/13/2023 0936   IRONPCTSAT 15.9 (L) 02/17/2024 1015   IRONPCTSAT 33 02/13/2023 0936   Lipid Panel     Component Value Date/Time   CHOL 159 02/13/2023 0936   TRIG 144.0 02/13/2023 0936   HDL 61.60 02/13/2023 0936   CHOLHDL 3 02/13/2023 0936   VLDL 28.8 02/13/2023 0936   LDLCALC 69 02/13/2023 0936   Hepatic Function Panel     Component Value Date/Time   PROT 7.0 12/19/2011 0945   ALBUMIN  3.4 (L) 12/19/2011 0945   AST 13 12/19/2011 0945   ALT 13 12/19/2011 0945   ALKPHOS 94 12/19/2011 0945   BILITOT 0.3 12/19/2011 0945      Component Value Date/Time   TSH 1.49 02/13/2023 0936   Nutritional Lab Results  Component Value Date   VD25OH 35.82 02/17/2024   VD25OH 40.25 11/10/2022     Assessment and  Plan Assessment & Plan Obesity and S/P RNY Obesity is a chronic condition with a history of Roux-en-Y gastric bypass in 2005, revised in 2013, and partial revision with bowel resection in 2023. Significant weight loss post-surgery was followed by weight gain after pregnancy and surgical complications. Obesity is influenced by genetic factors, emotional eating, and stressors. Over 40 mechanisms resist weight loss, including metabolic rate reduction, increased hunger, and fat storage efficiency. - Initiate category two eating plan focusing on mechanisms of weight gain - Provide a grocery list to support the eating plan - Instruct to avoid weighing herself and focus on the eating plan - Schedule follow-up appointment in two weeks  Fatigue Fatigue is worsening with weight gain, likely related to obesity and nutritional deficiencies. No recent labs are available. - Order labs to assess nutritional status and other potential causes of fatigue  Dyspnea on exertion Dyspnea on exertion has worsened with weight gain, likely related to exercise intolerance due to obesity. - Address through weight loss and dietary changes  Hypoglycemia with intermittent hyperglycemia She experiences hypoglycemia with intermittent hyperglycemia, not on glucose-lowering medications. Previous A1c was low at 4, indicating possible blood sugar fluctuations. Further investigation is needed. - Order labs to assess blood glucose levels and related parameters - Monitor blood sugar levels and adjust dietary plan accordingly  Hypertension Hypertension is well-controlled today. Improvement is planned through diet and exercise. - Address through dietary changes and weight loss  Vitamin B12 deficiency Vitamin B12 deficiency is noted, possibly related to status post weight loss surgery. No recent labs are available. - Order labs to assess vitamin B12 levels  Vitamin D  deficiency Vitamin D  deficiency is noted with no recent labs  available. - Order labs to assess vitamin D  levels  General Health Maintenance Ensure adequate nutritional intake post-bariatric surgery and monitor for deficiencies. Current supplementation includes a gummy multivitamin, calcium citrate, and Integra iron . - Continue current multivitamin and calcium citrate supplementation - Avoid starting new supplements until lab results are reviewed  Follow-up Follow-up is necessary to evaluate the effectiveness of the dietary plan and lab results. - Schedule follow-up appointment in two weeks - Verify next appointment and schedule another two weeks after that     I have personally spent 58 minutes total time today in preparation, patient care, and documentation for this visit, including the following: review of clinical lab tests; review of medical history, review of  obesity pathophysiology and nutritional counseling   She was informed of the importance of frequent follow up visits to maximize her success with intensive lifestyle modifications for her multiple health conditions.    Louann Penton, MD

## 2024-06-03 ENCOUNTER — Other Ambulatory Visit (HOSPITAL_BASED_OUTPATIENT_CLINIC_OR_DEPARTMENT_OTHER): Payer: Self-pay

## 2024-06-04 ENCOUNTER — Encounter (INDEPENDENT_AMBULATORY_CARE_PROVIDER_SITE_OTHER): Payer: Self-pay | Admitting: Family Medicine

## 2024-06-04 LAB — CBC WITH DIFFERENTIAL/PLATELET
Basophils Absolute: 0.1 x10E3/uL (ref 0.0–0.2)
Basos: 1 %
EOS (ABSOLUTE): 0.1 x10E3/uL (ref 0.0–0.4)
Eos: 1 %
Hematocrit: 42.8 % (ref 34.0–46.6)
Hemoglobin: 13.7 g/dL (ref 11.1–15.9)
Immature Grans (Abs): 0.1 x10E3/uL (ref 0.0–0.1)
Immature Granulocytes: 1 %
Lymphocytes Absolute: 1.6 x10E3/uL (ref 0.7–3.1)
Lymphs: 24 %
MCH: 30.4 pg (ref 26.6–33.0)
MCHC: 32 g/dL (ref 31.5–35.7)
MCV: 95 fL (ref 79–97)
Monocytes Absolute: 0.6 x10E3/uL (ref 0.1–0.9)
Monocytes: 9 %
Neutrophils Absolute: 4.2 x10E3/uL (ref 1.4–7.0)
Neutrophils: 64 %
Platelets: 281 x10E3/uL (ref 150–450)
RBC: 4.5 x10E6/uL (ref 3.77–5.28)
RDW: 13.5 % (ref 11.7–15.4)
WBC: 6.6 x10E3/uL (ref 3.4–10.8)

## 2024-06-04 LAB — CMP14+EGFR
ALT: 16 IU/L (ref 0–32)
AST: 18 IU/L (ref 0–40)
Albumin: 4 g/dL (ref 3.9–4.9)
Alkaline Phosphatase: 103 IU/L (ref 44–121)
BUN/Creatinine Ratio: 18 (ref 9–23)
BUN: 10 mg/dL (ref 6–24)
Bilirubin Total: 0.5 mg/dL (ref 0.0–1.2)
CO2: 19 mmol/L — ABNORMAL LOW (ref 20–29)
Calcium: 9 mg/dL (ref 8.7–10.2)
Chloride: 100 mmol/L (ref 96–106)
Creatinine, Ser: 0.57 mg/dL (ref 0.57–1.00)
Globulin, Total: 2.3 g/dL (ref 1.5–4.5)
Glucose: 83 mg/dL (ref 70–99)
Potassium: 4.4 mmol/L (ref 3.5–5.2)
Sodium: 136 mmol/L (ref 134–144)
Total Protein: 6.3 g/dL (ref 6.0–8.5)
eGFR: 111 mL/min/1.73 (ref >59)

## 2024-06-04 LAB — VITAMIN B12: Vitamin B-12: 949 pg/mL (ref 232–1245)

## 2024-06-04 LAB — LIPID PANEL WITH LDL/HDL RATIO
Cholesterol, Total: 147 mg/dL (ref 100–199)
HDL: 62 mg/dL (ref >39)
LDL Chol Calc (NIH): 73 mg/dL (ref 0–99)
LDL/HDL Ratio: 1.2 ratio (ref 0.0–3.2)
Triglycerides: 55 mg/dL (ref 0–149)
VLDL Cholesterol Cal: 12 mg/dL (ref 5–40)

## 2024-06-04 LAB — IRON AND TIBC
Iron Saturation: 25 % (ref 15–55)
Iron: 82 ug/dL (ref 27–159)
Total Iron Binding Capacity: 333 ug/dL (ref 250–450)
UIBC: 251 ug/dL (ref 131–425)

## 2024-06-04 LAB — HEMOGLOBIN A1C
Est. average glucose Bld gHb Est-mCnc: 100 mg/dL
Hgb A1c MFr Bld: 5.1 % (ref 4.8–5.6)

## 2024-06-04 LAB — T3: T3, Total: 156 ng/dL (ref 71–180)

## 2024-06-04 LAB — MAGNESIUM: Magnesium: 1.9 mg/dL (ref 1.6–2.3)

## 2024-06-04 LAB — T4, FREE: Free T4: 1.13 ng/dL (ref 0.82–1.77)

## 2024-06-04 LAB — PARATHYROID HORMONE, INTACT (NO CA): PTH: 22 pg/mL (ref 15–65)

## 2024-06-04 LAB — VITAMIN D 25 HYDROXY (VIT D DEFICIENCY, FRACTURES): Vit D, 25-Hydroxy: 39.1 ng/mL (ref 30.0–100.0)

## 2024-06-04 LAB — PHOSPHORUS: Phosphorus: 3.5 mg/dL (ref 3.0–4.3)

## 2024-06-04 LAB — FOLATE: Folate: >20 ng/mL (ref >3.0)

## 2024-06-04 LAB — FERRITIN: Ferritin: 31 ng/mL (ref 15–150)

## 2024-06-04 LAB — INSULIN, RANDOM: INSULIN: 4.2 u[IU]/mL (ref 2.6–24.9)

## 2024-06-04 LAB — TSH: TSH: 2.53 u[IU]/mL (ref 0.450–4.500)

## 2024-06-10 ENCOUNTER — Encounter: Payer: Self-pay | Admitting: Podiatry

## 2024-06-10 ENCOUNTER — Ambulatory Visit (INDEPENDENT_AMBULATORY_CARE_PROVIDER_SITE_OTHER): Admitting: Podiatry

## 2024-06-10 ENCOUNTER — Ambulatory Visit (INDEPENDENT_AMBULATORY_CARE_PROVIDER_SITE_OTHER)

## 2024-06-10 DIAGNOSIS — M722 Plantar fascial fibromatosis: Secondary | ICD-10-CM

## 2024-06-10 DIAGNOSIS — M779 Enthesopathy, unspecified: Secondary | ICD-10-CM | POA: Diagnosis not present

## 2024-06-10 MED ORDER — TRIAMCINOLONE ACETONIDE 10 MG/ML IJ SUSP
10.0000 mg | Freq: Once | INTRAMUSCULAR | Status: AC
Start: 2024-06-10 — End: 2024-06-10
  Administered 2024-06-10: 10 mg via INTRA_ARTICULAR

## 2024-06-12 NOTE — Progress Notes (Signed)
 Subjective:   Patient ID: [name], female   DOB: 48 y.o.   MRN: [id number]   HPI Patient presents stated she has a lot of pain on top of her right foot and also in the left foot she is getting discomfort consistent with the beginning of bunion pain   ROS      Objective:  Physical Exam  Neurovascular status intact patient is right heel is doing excellent from previous surgery with pain around the dorsal lateral foot in the area of the peroneal tertius tendon group.  Patient on the left is starting to develop plantar fasciitis but she is having trouble stretching the foot properly     Assessment:  Tendinitis dorsal lateral right with well-healed sites from plantar fascial surgery with left showing plantar fascial inflammation moderate     Plan:  H&P reviewed both conditions and for the right I did do careful sterile prep injected the peroneal tertius group 3 mg dexamethasone  Kenalog  5 mg Xylocaine  for the left I dispensed night splint to try to stretch the foot along with heat ice therapy and hopefully we can avoid other treatments  X-rays indicate no loss of arch height secondary to previous surgery with no indications of arthritis stress fracture

## 2024-06-13 DIAGNOSIS — F419 Anxiety disorder, unspecified: Secondary | ICD-10-CM | POA: Diagnosis not present

## 2024-06-13 DIAGNOSIS — F330 Major depressive disorder, recurrent, mild: Secondary | ICD-10-CM | POA: Diagnosis not present

## 2024-06-14 ENCOUNTER — Encounter: Payer: Self-pay | Admitting: Obstetrics & Gynecology

## 2024-06-14 DIAGNOSIS — N951 Menopausal and female climacteric states: Secondary | ICD-10-CM

## 2024-06-15 ENCOUNTER — Other Ambulatory Visit (HOSPITAL_BASED_OUTPATIENT_CLINIC_OR_DEPARTMENT_OTHER): Payer: Self-pay

## 2024-06-15 MED ORDER — ESTRADIOL 0.1 MG/24HR TD PTWK
0.1000 mg | MEDICATED_PATCH | TRANSDERMAL | 12 refills | Status: DC
  Filled 2024-06-15: qty 4, 28d supply, fill #0
  Filled 2024-07-09: qty 4, 28d supply, fill #1
  Filled 2024-07-24 – 2024-08-01 (×4): qty 4, 28d supply, fill #2
  Filled 2024-08-16 – 2024-08-22 (×2): qty 4, 28d supply, fill #3

## 2024-06-16 ENCOUNTER — Ambulatory Visit (INDEPENDENT_AMBULATORY_CARE_PROVIDER_SITE_OTHER): Admitting: Family Medicine

## 2024-06-16 ENCOUNTER — Encounter (INDEPENDENT_AMBULATORY_CARE_PROVIDER_SITE_OTHER): Payer: Self-pay | Admitting: Family Medicine

## 2024-06-16 ENCOUNTER — Other Ambulatory Visit (HOSPITAL_BASED_OUTPATIENT_CLINIC_OR_DEPARTMENT_OTHER): Payer: Self-pay

## 2024-06-16 VITALS — BP 98/82 | HR 53 | Temp 98.3°F | Ht 67.0 in | Wt 237.0 lb

## 2024-06-16 DIAGNOSIS — Z9884 Bariatric surgery status: Secondary | ICD-10-CM | POA: Diagnosis not present

## 2024-06-16 DIAGNOSIS — N951 Menopausal and female climacteric states: Secondary | ICD-10-CM | POA: Diagnosis not present

## 2024-06-16 DIAGNOSIS — E559 Vitamin D deficiency, unspecified: Secondary | ICD-10-CM | POA: Diagnosis not present

## 2024-06-16 DIAGNOSIS — E669 Obesity, unspecified: Secondary | ICD-10-CM | POA: Diagnosis not present

## 2024-06-16 DIAGNOSIS — Z6837 Body mass index (BMI) 37.0-37.9, adult: Secondary | ICD-10-CM | POA: Diagnosis not present

## 2024-06-16 DIAGNOSIS — E538 Deficiency of other specified B group vitamins: Secondary | ICD-10-CM | POA: Diagnosis not present

## 2024-06-16 MED ORDER — VITAMIN D (ERGOCALCIFEROL) 1.25 MG (50000 UNIT) PO CAPS
50000.0000 [IU] | ORAL_CAPSULE | ORAL | 0 refills | Status: DC
  Filled 2024-06-16: qty 4, 28d supply, fill #0

## 2024-06-16 NOTE — Addendum Note (Signed)
 Addended by: LAFE BAKER CROME on: 06/16/2024 02:31 PM   Modules accepted: Orders

## 2024-06-16 NOTE — Progress Notes (Signed)
 Office: [phone]  /  Fax: [phone]  WEIGHT SUMMARY AND BIOMETRICS  Anthropometric Measurements Height: 5' 7 (1.702 m) Weight: 237 lb (107.5 kg) BMI (Calculated): 37.11 Weight at Last Visit: 244 lb Weight Lost Since Last Visit: 7 lb Weight Gained Since Last Visit: 0 Starting Weight: 244 lb Total Weight Loss (lbs): 7 lb (3.175 kg) Peak Weight: 320 lb Waist Measurement : 54 inches   Body Composition  Body Fat %: 46.4 % Fat Mass (lbs): 110.2 lbs Muscle Mass (lbs): 121 lbs Total Body Water  (lbs): 8.2 lbs Visceral Fat Rating : 13   Other Clinical Data Fasting: no Labs: no Today's Visit #: 2 Starting Date: 06/02/24    Chief Complaint: OBESITY     History of Present Illness [name] is a 48 year old female who presents to discuss her obesity test results and eating plan.  She has difficulty with the protein portion of her diet, stating that 'chicken, beef, those things don't sit well' if consumed in large amounts. She prefers more portable protein options to space throughout the day rather than consuming large portions at once. Despite these challenges, she successfully adhered to the plan last week, losing seven pounds in two weeks. However, she fell off the plan over the weekend due to illness, consuming mostly chicken and rice soup.  She is addressing vitamin D  deficiency. Her vitamin D  level was checked four months ago and has improved slightly. She currently takes calcium citrate with vitamin D , totaling 500 IUs daily. She notes that she may not convert sunlight efficiently for vitamin D  synthesis.  She has a history of B12 deficiency, which has improved significantly from 266 to 949, attributed to her use of a B complex supplement. She continues her B12 supplementation but may reduce the frequency.  She experiences symptoms related to menopause, including exhaustion and hot flashes, occurring about five to six times a day. She uses a fan at work to  manage the heat. Her sleep is affected by insomnia and excessive daytime sleepiness, leading to a neuro consult for possible narcolepsy. She started an estrogen patch about a month ago, which has slightly improved her energy levels.  Her current medications include a B complex supplement, calcium citrate with vitamin D , and an estrogen patch for menopause symptoms.      PHYSICAL EXAM:  Blood pressure 98/82, pulse (!) 53, temperature 98.3 F (36.8 C), height 5' 7 (1.702 m), weight 237 lb (107.5 kg), SpO2 100%. Body mass index is 37.12 kg/m.  DIAGNOSTIC DATA REVIEWED:  BMET    Component Value Date/Time   NA 136 06/02/2024 1006   K 4.4 06/02/2024 1006   CL 100 06/02/2024 1006   CO2 19 (L) 06/02/2024 1006   GLUCOSE 83 06/02/2024 1006   GLUCOSE 78 12/19/2011 0945   BUN 10 06/02/2024 1006   CREATININE 0.57 06/02/2024 1006   CALCIUM 9.0 06/02/2024 1006   GFRNONAA >90 12/22/2011 1415   GFRAA >90 12/22/2011 1415   Lab Results  Component Value Date   HGBA1C 5.1 06/02/2024   HGBA1C 5.2 02/17/2024   Lab Results  Component Value Date   INSULIN  4.2 06/02/2024   Lab Results  Component Value Date   TSH 2.530 06/02/2024   CBC    Component Value Date/Time   WBC 6.6 06/02/2024 1006   WBC 6.9 02/17/2024 1015   RBC 4.50 06/02/2024 1006   RBC 4.32 02/17/2024 1015   HGB 13.7 06/02/2024 1006   HCT 42.8 06/02/2024 1006  PLT 281 06/02/2024 1006   MCV 95 06/02/2024 1006   MCH 30.4 06/02/2024 1006   MCH 30.8 11/09/2023 0915   MCHC 32.0 06/02/2024 1006   MCHC 33.4 02/17/2024 1015   RDW 13.5 06/02/2024 1006   Iron  Studies    Component Value Date/Time   IRON  82 06/02/2024 1006   TIBC 333 06/02/2024 1006   FERRITIN 31 06/02/2024 1006   IRONPCTSAT 25 06/02/2024 1006   IRONPCTSAT 33 02/13/2023 0936   Lipid Panel     Component Value Date/Time   CHOL 147 06/02/2024 1006   TRIG 55 06/02/2024 1006   HDL 62 06/02/2024 1006   CHOLHDL 3 02/13/2023 0936   VLDL 28.8 02/13/2023 0936    LDLCALC 73 06/02/2024 1006   Hepatic Function Panel     Component Value Date/Time   PROT 6.3 06/02/2024 1006   ALBUMIN  4.0 06/02/2024 1006   AST 18 06/02/2024 1006   ALT 16 06/02/2024 1006   ALKPHOS 103 06/02/2024 1006   BILITOT 0.5 06/02/2024 1006      Component Value Date/Time   TSH 2.530 06/02/2024 1006   Nutritional Lab Results  Component Value Date   VD25OH 39.1 06/02/2024   VD25OH 35.82 02/17/2024   VD25OH 40.25 11/10/2022     Assessment and Plan Assessment & Plan Obesity She has lost seven pounds in two weeks, with five pounds being fat loss, exceeding expectations. The current eating plan is slightly restrictive, requiring adjustments to better fit her needs, particularly in protein intake. She struggles with consuming large portions of protein and prefers portable options. Emphasized the importance of maintaining muscle mass during weight loss and the need for 100 grams of protein daily. Advised real food protein over supplements for better calorie burning. Her maintenance calorie intake is 1800 calories, but for weight loss, she should aim for 1300-1500 calories per day, ensuring not to drop below 1300 to avoid metabolic slowdown. - Adjust eating plan to include more portable protein options. - Aim for 100 grams of protein daily. - Maintain calorie intake between 1300-1500 calories per day. - Provide protein-rich recipes and food options. - Encourage use of real food protein over supplements.  Menopausal Symptoms She experiences menopausal symptoms, including exhaustion and hot flashes, which have improved slightly with an estrogen patch. Reports increased energy levels since starting the patch. Plans to increase the estrogen patch dosage to further alleviate symptoms. - Increase estrogen patch dosage from 0.05 to 0.1.  Vitamin D  Deficiency Her vitamin D  levels have improved but remain below the optimal range. Currently taking calcium citrate with vitamin D ,  providing 500 IUs daily. Explained the role of sunlight in vitamin D  synthesis and the potential for osteoporosis if levels remain low. She expressed a desire to avoid osteoporosis and improve energy levels. Recommended a mix of D2 and D3 for better absorption. - Prescribe vitamin D2, 50,000 IU once weekly. - Continue current vitamin D3 supplementation. - Re-evaluate vitamin D  levels in three months.  Vitamin B12 Deficiency Her B12 levels have improved significantly from 266 to 949, likely due to taking a B complex supplement. Levels above 500 are desirable. Suggested reducing the B12 intake to avoid unnecessary excretion, as excess B12 is excreted in urine. - Continue B12 supplementation at a reduced frequency, possibly every other day.  General Health Maintenance Lab results, including kidney, liver, electrolytes, cholesterol, anemia panel, and thyroid  function, are within normal ranges. She has excellent blood sugar and fasting insulin  levels, indicating no insulin  resistance. Emphasized the importance  of balanced nutrition and maintaining muscle mass during weight loss. - Encourage balanced nutrition with adequate protein intake.  Follow-up She is advised to schedule follow-up appointments to monitor progress and adjust treatment plans as necessary. She has a neuro consult next week to address sleep issues and possible narcolepsy. - Schedule three follow-up appointments. - Follow up on vitamin D  levels in three months. - Monitor progress with the neuro consult next week.     I have personally spent 58 minutes total time today in preparation, patient care, and documentation for this visit, including the following: review of clinical lab tests; review of medical history, review of biometrics nad nutritional counseling   She was informed of the importance of frequent follow up visits to maximize her success with intensive lifestyle modifications for her multiple health conditions.    Louann Penton, MD

## 2024-06-16 NOTE — Addendum Note (Signed)
 Addended by: LAFE BAKER CROME on: 06/16/2024 05:07 PM   Modules accepted: Orders

## 2024-06-20 DIAGNOSIS — F4323 Adjustment disorder with mixed anxiety and depressed mood: Secondary | ICD-10-CM | POA: Diagnosis not present

## 2024-06-27 ENCOUNTER — Other Ambulatory Visit (HOSPITAL_BASED_OUTPATIENT_CLINIC_OR_DEPARTMENT_OTHER): Payer: Self-pay

## 2024-06-27 DIAGNOSIS — F331 Major depressive disorder, recurrent, moderate: Secondary | ICD-10-CM | POA: Diagnosis not present

## 2024-06-27 DIAGNOSIS — F840 Autistic disorder: Secondary | ICD-10-CM | POA: Diagnosis not present

## 2024-06-27 DIAGNOSIS — F900 Attention-deficit hyperactivity disorder, predominantly inattentive type: Secondary | ICD-10-CM | POA: Diagnosis not present

## 2024-06-27 DIAGNOSIS — F411 Generalized anxiety disorder: Secondary | ICD-10-CM | POA: Diagnosis not present

## 2024-06-28 ENCOUNTER — Other Ambulatory Visit: Payer: Self-pay

## 2024-06-28 ENCOUNTER — Other Ambulatory Visit (HOSPITAL_BASED_OUTPATIENT_CLINIC_OR_DEPARTMENT_OTHER): Payer: Self-pay

## 2024-06-28 MED ORDER — TRAZODONE HCL 100 MG PO TABS
100.0000 mg | ORAL_TABLET | Freq: Every evening | ORAL | 0 refills | Status: DC | PRN
  Filled 2024-06-28: qty 60, 30d supply, fill #0

## 2024-06-28 MED ORDER — DULOXETINE HCL 30 MG PO CPEP
90.0000 mg | ORAL_CAPSULE | Freq: Every day | ORAL | 0 refills | Status: AC
  Filled 2024-06-28: qty 90, 30d supply, fill #0

## 2024-06-28 MED ORDER — AMPHETAMINE-DEXTROAMPHETAMINE 20 MG PO TABS
20.0000 mg | ORAL_TABLET | Freq: Two times a day (BID) | ORAL | 0 refills | Status: DC
  Filled 2024-06-29: qty 60, 30d supply, fill #0

## 2024-06-28 MED ORDER — PRAMIPEXOLE DIHYDROCHLORIDE 1.5 MG PO TABS
1.5000 mg | ORAL_TABLET | Freq: Every day | ORAL | 0 refills | Status: DC
  Filled 2024-06-28: qty 30, 30d supply, fill #0

## 2024-06-28 MED ORDER — LORAZEPAM 1 MG PO TABS
1.0000 mg | ORAL_TABLET | Freq: Two times a day (BID) | ORAL | 0 refills | Status: AC
  Filled 2024-06-28: qty 60, 30d supply, fill #0

## 2024-06-28 MED ORDER — BUSPIRONE HCL 30 MG PO TABS
30.0000 mg | ORAL_TABLET | Freq: Two times a day (BID) | ORAL | 0 refills | Status: AC
  Filled 2024-06-28: qty 60, 30d supply, fill #0

## 2024-06-29 ENCOUNTER — Other Ambulatory Visit (HOSPITAL_BASED_OUTPATIENT_CLINIC_OR_DEPARTMENT_OTHER): Payer: Self-pay

## 2024-06-29 DIAGNOSIS — F4323 Adjustment disorder with mixed anxiety and depressed mood: Secondary | ICD-10-CM | POA: Diagnosis not present

## 2024-07-04 ENCOUNTER — Other Ambulatory Visit (HOSPITAL_BASED_OUTPATIENT_CLINIC_OR_DEPARTMENT_OTHER): Payer: Self-pay

## 2024-07-04 ENCOUNTER — Encounter (INDEPENDENT_AMBULATORY_CARE_PROVIDER_SITE_OTHER): Payer: Self-pay | Admitting: Family Medicine

## 2024-07-04 ENCOUNTER — Ambulatory Visit (INDEPENDENT_AMBULATORY_CARE_PROVIDER_SITE_OTHER): Admitting: Family Medicine

## 2024-07-04 VITALS — BP 121/83 | HR 70 | Temp 97.4°F | Ht 67.0 in | Wt 241.0 lb

## 2024-07-04 DIAGNOSIS — E66812 Obesity, class 2: Secondary | ICD-10-CM

## 2024-07-04 DIAGNOSIS — E559 Vitamin D deficiency, unspecified: Secondary | ICD-10-CM | POA: Diagnosis not present

## 2024-07-04 DIAGNOSIS — F4323 Adjustment disorder with mixed anxiety and depressed mood: Secondary | ICD-10-CM | POA: Diagnosis not present

## 2024-07-04 DIAGNOSIS — E669 Obesity, unspecified: Secondary | ICD-10-CM | POA: Diagnosis not present

## 2024-07-04 DIAGNOSIS — Z6837 Body mass index (BMI) 37.0-37.9, adult: Secondary | ICD-10-CM

## 2024-07-04 MED ORDER — VITAMIN D (ERGOCALCIFEROL) 1.25 MG (50000 UNIT) PO CAPS
50000.0000 [IU] | ORAL_CAPSULE | ORAL | 0 refills | Status: DC
  Filled 2024-07-04 (×2): qty 4, 28d supply, fill #0

## 2024-07-04 NOTE — Progress Notes (Signed)
 Office: [phone]  /  Fax: [phone]  WEIGHT SUMMARY AND BIOMETRICS  Anthropometric Measurements Height: 5' 7 (1.702 m) Weight: 241 lb (109.3 kg) BMI (Calculated): 37.74 Weight at Last Visit: 237 lb Weight Lost Since Last Visit: 0 Weight Gained Since Last Visit: 4 lb Starting Weight: 244 lb Total Weight Loss (lbs): 3 lb (1.361 kg) Peak Weight: 320 lb Waist Measurement : 54 inches   Body Composition  Body Fat %: 49.6 % Fat Mass (lbs): 119.6 lbs Muscle Mass (lbs): 115.4 lbs Total Body Water  (lbs): 88 lbs Visceral Fat Rating : 14   Other Clinical Data Fasting: no Labs: no Today's Visit #: 3 Starting Date: 06/02/24    Chief Complaint: OBESITY   History of Present Illness [name] is a 48 year old female who presents for a follow-up on her obesity treatment and progress assessment.  She has been adhering to the category two eating plan approximately fifty percent of the time. Despite this, she has not engaged in any exercise and has experienced a weight gain of four pounds over the past three weeks since her last visit.  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 She finds planning and organizing the homeschool curriculum enjoyable.  For convenience, she utilizes [employer] delivery services, with her husband assisting by purchasing fresh items. She plans to visit [place] for groceries after the appointment. She finds meal planning and preparation to be challenging.  Her current medication regimen includes vitamin D , which she needs refilled. She has been using a high-protein smoothie recipe provided previously, which she enjoys for breakfast.      PHYSICAL EXAM:  Blood pressure 121/83, pulse 70, temperature (!) 97.4 F (36.3 C), height 5' 7 (1.702 m), weight 241 lb (109.3 kg), SpO2 98%. Body mass index is 37.75 kg/m.  DIAGNOSTIC DATA  REVIEWED:  BMET    Component Value Date/Time   NA 136 06/02/2024 1006   K 4.4 06/02/2024 1006   CL 100 06/02/2024 1006   CO2 19 (L) 06/02/2024 1006   GLUCOSE 83 06/02/2024 1006   GLUCOSE 78 12/19/2011 0945   BUN 10 06/02/2024 1006   CREATININE 0.57 06/02/2024 1006   CALCIUM 9.0 06/02/2024 1006   GFRNONAA >90 12/22/2011 1415   GFRAA >90 12/22/2011 1415   Lab Results  Component Value Date   HGBA1C 5.1 06/02/2024   HGBA1C 5.2 02/17/2024   Lab Results  Component Value Date   INSULIN  4.2 06/02/2024   Lab Results  Component Value Date   TSH 2.530 06/02/2024   CBC    Component Value Date/Time   WBC 6.6 06/02/2024 1006   WBC 6.9 02/17/2024 1015   RBC 4.50 06/02/2024 1006   RBC 4.32 02/17/2024 1015   HGB 13.7 06/02/2024 1006   HCT 42.8 06/02/2024 1006   PLT 281 06/02/2024 1006   MCV 95 06/02/2024 1006   MCH 30.4 06/02/2024 1006   MCH 30.8 11/09/2023 0915   MCHC 32.0 06/02/2024 1006   MCHC 33.4 02/17/2024 1015   RDW 13.5 06/02/2024 1006   Iron  Studies    Component Value Date/Time   IRON  82 06/02/2024 1006   TIBC 333 06/02/2024 1006   FERRITIN 31 06/02/2024 1006   IRONPCTSAT 25 06/02/2024 1006   IRONPCTSAT 33 02/13/2023 0936   Lipid Panel     Component Value Date/Time   CHOL 147 06/02/2024 1006   TRIG 55 06/02/2024 1006   HDL  62 06/02/2024 1006   CHOLHDL 3 02/13/2023 0936   VLDL 28.8 02/13/2023 0936   LDLCALC 73 06/02/2024 1006   Hepatic Function Panel     Component Value Date/Time   PROT 6.3 06/02/2024 1006   ALBUMIN  4.0 06/02/2024 1006   AST 18 06/02/2024 1006   ALT 16 06/02/2024 1006   ALKPHOS 103 06/02/2024 1006   BILITOT 0.5 06/02/2024 1006      Component Value Date/Time   TSH 2.530 06/02/2024 1006   Nutritional Lab Results  Component Value Date   VD25OH 39.1 06/02/2024   VD25OH 35.82 02/17/2024   VD25OH 40.25 11/10/2022     Assessment and Plan Assessment & Plan Obesity Obesity management is ongoing. She has been following the  category two eating plan about 50% of the time and has not been exercising. She has gained four pounds in the last three weeks. The current plan is not yielding the desired weight loss results. Discussed alternative dietary plans including a journaling plan using the Chronometer app, which allows for more freedom and tracking of nutritional intake. Emphasized the importance of maintaining a caloric intake of 1300-1500 calories per day to achieve weight loss without triggering metabolic slowdown. Discussed protein intake goals to prevent muscle breakdown and promote weight loss, recommending at least 100 grams of protein per day. Discussed the benefits of real food protein sources over supplements due to the thermogenic effect of food. - Transition to a journaling plan using the Chronometer app. - Maintain caloric intake between 1300-1500 calories per day. - Aim for at least 100 grams of protein per day. - Encourage use of real food protein sources over supplements.  Vitamin D  deficiency Vitamin D  deficiency is being managed with supplementation. She is unsure about the status of her refills. - Refill vitamin D  prescription at [facility].    She was informed of the importance of frequent follow up visits to maximize her success with intensive lifestyle modifications for her multiple health conditions.    Louann Penton, MD

## 2024-07-05 ENCOUNTER — Other Ambulatory Visit: Payer: Self-pay

## 2024-07-05 ENCOUNTER — Other Ambulatory Visit (HOSPITAL_BASED_OUTPATIENT_CLINIC_OR_DEPARTMENT_OTHER): Payer: Self-pay

## 2024-07-08 ENCOUNTER — Encounter: Payer: Self-pay | Admitting: Podiatry

## 2024-07-08 ENCOUNTER — Ambulatory Visit (INDEPENDENT_AMBULATORY_CARE_PROVIDER_SITE_OTHER): Admitting: Podiatry

## 2024-07-08 DIAGNOSIS — M722 Plantar fascial fibromatosis: Secondary | ICD-10-CM | POA: Diagnosis not present

## 2024-07-08 MED ORDER — TRIAMCINOLONE ACETONIDE 10 MG/ML IJ SUSP
10.0000 mg | Freq: Once | INTRAMUSCULAR | Status: AC
  Administered 2024-07-08: 10 mg via INTRA_ARTICULAR

## 2024-07-08 NOTE — Progress Notes (Signed)
 Subjective:   Patient ID: [name], female   DOB: 48 y.o.   MRN: [id number]   HPI Patient states I am slowly getting better in the night splint seems to help me quite a bit but I need to be able to stretch my left heel and I am not able to because I cannot have enough time and the heel has been very sore   ROS      Objective:  Physical Exam  Right heel is improved with significant diminishment of discomfort left heel quite sore with pain also into the arch noted     Assessment:  Plantar fasciitis still bothersome left with right heel doing much better after having endoscopic surgery     Plan:  H&P reviewed both conditions and for the left I did reinject the plantar fascia 3 mg Kenalog  5 mg Xylocaine  and then I dispensed night splint for the left so she can stretch that properly with the right with all instructions on usage properly fitted into the lower leg and prefabricated of a soft material

## 2024-07-11 DIAGNOSIS — F4323 Adjustment disorder with mixed anxiety and depressed mood: Secondary | ICD-10-CM | POA: Diagnosis not present

## 2024-07-15 DIAGNOSIS — F840 Autistic disorder: Secondary | ICD-10-CM | POA: Diagnosis not present

## 2024-07-15 DIAGNOSIS — F411 Generalized anxiety disorder: Secondary | ICD-10-CM | POA: Diagnosis not present

## 2024-07-15 DIAGNOSIS — F900 Attention-deficit hyperactivity disorder, predominantly inattentive type: Secondary | ICD-10-CM | POA: Diagnosis not present

## 2024-07-15 DIAGNOSIS — F3341 Major depressive disorder, recurrent, in partial remission: Secondary | ICD-10-CM | POA: Diagnosis not present

## 2024-07-18 ENCOUNTER — Encounter (INDEPENDENT_AMBULATORY_CARE_PROVIDER_SITE_OTHER): Payer: Self-pay | Admitting: Family Medicine

## 2024-07-18 ENCOUNTER — Ambulatory Visit (INDEPENDENT_AMBULATORY_CARE_PROVIDER_SITE_OTHER): Admitting: Family Medicine

## 2024-07-18 ENCOUNTER — Encounter: Payer: Self-pay | Admitting: Obstetrics & Gynecology

## 2024-07-18 ENCOUNTER — Other Ambulatory Visit (HOSPITAL_BASED_OUTPATIENT_CLINIC_OR_DEPARTMENT_OTHER): Payer: Self-pay

## 2024-07-18 ENCOUNTER — Ambulatory Visit (INDEPENDENT_AMBULATORY_CARE_PROVIDER_SITE_OTHER): Admitting: Obstetrics & Gynecology

## 2024-07-18 VITALS — BP 122/87 | HR 72

## 2024-07-18 VITALS — BP 128/78 | HR 86 | Temp 98.0°F | Ht 67.0 in | Wt 235.0 lb

## 2024-07-18 DIAGNOSIS — Z6836 Body mass index (BMI) 36.0-36.9, adult: Secondary | ICD-10-CM

## 2024-07-18 DIAGNOSIS — E669 Obesity, unspecified: Secondary | ICD-10-CM

## 2024-07-18 DIAGNOSIS — F39 Unspecified mood [affective] disorder: Secondary | ICD-10-CM

## 2024-07-18 DIAGNOSIS — F4323 Adjustment disorder with mixed anxiety and depressed mood: Secondary | ICD-10-CM | POA: Diagnosis not present

## 2024-07-18 DIAGNOSIS — E559 Vitamin D deficiency, unspecified: Secondary | ICD-10-CM

## 2024-07-18 DIAGNOSIS — N951 Menopausal and female climacteric states: Secondary | ICD-10-CM | POA: Diagnosis not present

## 2024-07-18 DIAGNOSIS — Z79890 Hormone replacement therapy (postmenopausal): Secondary | ICD-10-CM

## 2024-07-18 MED ORDER — VITAMIN D (ERGOCALCIFEROL) 1.25 MG (50000 UNIT) PO CAPS
50000.0000 [IU] | ORAL_CAPSULE | ORAL | 0 refills | Status: DC
  Filled 2024-07-18 – 2024-07-29 (×3): qty 4, 28d supply, fill #0

## 2024-07-18 NOTE — Progress Notes (Signed)
 GYNECOLOGY OFFICE VISIT NOTE  History:  [name] is a 48 y.o. [id number] here today for follow up after initiation of HRT for menopausal vasomotor symptoms.  Symptoms are controlled on Climara  0.1 mg/24hr patch.  She is very satisfied with this. She denies any abnormal vaginal discharge, bleeding, pelvic pain or other concerns.  Past Medical History:  Diagnosis Date   ADHD (attention deficit hyperactivity disorder)    Anemia    Anxiety with depression    Autism    Level 1   B12 deficiency    Back pain    Chronic fatigue    Depression with anxiety    Edema of both lower extremities    Excessive sleepiness    GAD (generalized anxiety disorder)    Gallbladder problem    GERD (gastroesophageal reflux disease)    Headache(784.0)    High blood pressure    History of gastric ulcer    partial ulcer perferation gastrojujunal anastomosis  post gastric bypass  -- s/p  surgical repair 2012 and 2013   History of palpitations in adulthood    per pt had occasional palpitations ,  told had PVCs   History of seizures as a child    febrile   History of stomach ulcers    IBS (irritable bowel syndrome)    IDA (iron  deficiency anemia)    Infertility, female    Insomnia    Insomnia    Irritable bowel syndrome with diarrhea    MDD (major depressive disorder)    Migraines    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/  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;   anastomtic ulcer s/p bypass s/p repair x2 2012 and 2013;   Sleep apnea    SOBOE (shortness of breath on exertion)    SOBOE (shortness of breath on exertion)    SUI (stress urinary incontinence, female)    Wears glasses     Past Surgical History:  Procedure Laterality Date   BLADDER SUSPENSION N/A 12/09/2023   Procedure: TRANSVAGINAL TAPE (TVT) PROCEDURE;  Surgeon: Marilynne Rosaline SAILOR, MD;  Location: [facility];   Service: Gynecology;  Laterality: N/A;   COLONOSCOPY WITH PROPOFOL   2023   CYSTOSCOPY N/A 12/09/2023   Procedure: CYSTOSCOPY;  Surgeon: Marilynne Rosaline SAILOR, MD;  [facility]  Service: Gynecology;  Laterality: N/A;   DILITATION & CURRETTAGE/HYSTROSCOPY WITH ESSURE  2015   bilateral tubal sterilization with essure   ESOPHAGOGASTRODUODENOSCOPY  11/28/2011   Procedure: ESOPHAGOGASTRODUODENOSCOPY (EGD);  Surgeon: Belvie JONETTA Just, MD;  Location: [facility];  Service: Endoscopy;  Laterality: N/A;   LAPAROSCOPIC CHOLECYSTECTOMY  2006   LAPAROSCOPIC GASTROTOMY W/ REPAIR OF ULCER  10/23/2009   @[facility] ;   by dr s. gross;  EXTENSIVE LYSIS ADHESIONS/  OMENTAL PATCH OF ULCER   LAPAROSCOPY ABDOMEN DIAGNOSTIC  02/21/2011   @[facility]  by dr forbes. wilson/ dr b. sebastian;  upper endoscopy/  LAPAROSCOPIC OMENTAL PATCH REPAIR GASTROJEJUNAL ANASTOMTIC PERFORATION / PLACEMENT DRAIN   ROBOTIC ASSISTED LAPAROSCOPIC OVARIAN CYSTECTOMY Right 12/09/2023   Procedure: XI ROBOTIC ASSISTED LAPAROSCOPIC OVARIAN CYSTECTOMY;  Surgeon: Marilynne Rosaline SAILOR, MD;  Location: [facility];  Service: Gynecology;  Laterality: Right;   ROUX-EN-Y GASTRIC BYPASS  2005   @ [facility] by dr sherlean sous;  laparoscopic   VENTRAL HERNIA REPAIR  12/22/2011   Procedure: HERNIA REPAIR VENTRAL  ADULT;  Surgeon: Donnice KATHEE Lunger, MD;  Location: [facility];  Service: General;;  RESECTION/ RE-DO  OF PRIOR GASTROJEJUNOSTOMY (GASTRIC BYPASS REVISION)   XI ROBOTIC ASSISTED SMALL BOWEL RESECTION  06/05/2022   @ [facility] by dr shaunna. chandler;  for intussusception intestine   XI ROBOTIC ASSISTED TOTAL HYSTERECTOMY WITH SACROCOLPOPEXY N/A 12/09/2023   Procedure: XI ROBOTIC ASSISTED TOTAL HYSTERECTOMY WITH BILATERAL SALPINGECTOMY AND SACROCOLPOPEXY;  Surgeon: Marilynne Rosaline SAILOR, MD;  Location: [facility];  Service: Gynecology;  Laterality: N/A;    The following portions of the patient's history were reviewed and updated  as appropriate: allergies, current medications, past family history, past medical history, past social history, past surgical history and problem list.   Health Maintenance:  All up to date.  Review of Systems:  Pertinent items noted in HPI and remainder of comprehensive ROS otherwise negative.  Physical Exam:  BP 122/87   Pulse 72  CONSTITUTIONAL: Well-developed, well-nourished female in no acute distress.  HEENT:  Normocephalic, atraumatic. External right and left ear normal. No scleral icterus.  NECK: Normal range of motion, supple, no masses noted on observation SKIN: No rash noted. Not diaphoretic. No erythema. No pallor. MUSCULOSKELETAL: Normal range of motion. No edema noted. NEUROLOGIC: Alert and oriented to person, place, and time. Normal muscle tone coordination. No cranial nerve deficit noted on observation. PSYCHIATRIC: Normal mood and affect. Normal behavior. Normal judgment and thought content. CARDIOVASCULAR: Normal heart rate noted RESPIRATORY: Effort and breath sounds normal, no problems with respiration noted ABDOMEN: No masses or other overt distention noted on observation. No tenderness.   PELVIC: Deferred  Assessment and Plan:    1. Menopausal syndrome on hormone replacement therapy (Primary) Will continue Climara  at current dosage She will let us  know if she has any other concerns or other gynecologic issues.  Routine preventative health maintenance measures emphasized.  Return for any gynecologic concerns.    I spent 15 minutes dedicated to the care of this patient including pre-visit review of records, face to face time with the patient discussing her conditions and treatments, post visit ordering of medications and appropriate tests or procedures, coordinating care and documenting this visit encounter.    GLORIS HUGGER, MD, FACOG Obstetrician & Gynecologist, [facility] for [employer], [facility]

## 2024-07-19 ENCOUNTER — Other Ambulatory Visit (HOSPITAL_BASED_OUTPATIENT_CLINIC_OR_DEPARTMENT_OTHER): Payer: Self-pay

## 2024-07-19 MED ORDER — CAPVAXIVE 0.5 ML IM SOSY
0.5000 mL | PREFILLED_SYRINGE | Freq: Once | INTRAMUSCULAR | 0 refills | Status: AC
  Filled 2024-07-19: qty 0.5, 1d supply, fill #0

## 2024-07-20 ENCOUNTER — Other Ambulatory Visit (HOSPITAL_BASED_OUTPATIENT_CLINIC_OR_DEPARTMENT_OTHER): Payer: Self-pay

## 2024-07-20 ENCOUNTER — Encounter: Payer: Self-pay | Admitting: Orthopaedic Surgery

## 2024-07-20 ENCOUNTER — Other Ambulatory Visit (INDEPENDENT_AMBULATORY_CARE_PROVIDER_SITE_OTHER): Payer: Self-pay

## 2024-07-20 ENCOUNTER — Ambulatory Visit (INDEPENDENT_AMBULATORY_CARE_PROVIDER_SITE_OTHER): Admitting: Orthopaedic Surgery

## 2024-07-20 DIAGNOSIS — M79644 Pain in right finger(s): Secondary | ICD-10-CM

## 2024-07-20 MED ORDER — METHYLPREDNISOLONE 4 MG PO TBPK
ORAL_TABLET | ORAL | 0 refills | Status: AC
  Filled 2024-07-20 (×2): qty 21, 6d supply, fill #0

## 2024-07-20 NOTE — Progress Notes (Signed)
 Office Visit Note   Patient: [name]           Date of Birth: 20-Mar-1976           MRN: [id number] Visit Date: 07/20/2024              Requested by: Daryl Setter, NP [address] [place],  [place] [address] PCP: Daryl Setter, NP   Assessment & Plan: Visit Diagnoses:  1. Pain of right thumb     Plan: History of Present Illness [name] is a 48 year old female who presents with right thumb pain.  She experiences constant pain in her right thumb, which becomes sharp with movement or when attempting to pick up objects. The sharp pain radiates from the thumb to the base and sometimes further up the hand. The pain occasionally disrupts her sleep at night. There are no recent injuries to the thumb.  Physical Exam MUSCULOSKELETAL: No tenderness over radial styloid of right hand. Tenderness at the base of the right thumb. Mild tenderness at MCP joint of right thumb. Positive CMC grind test on right thumb. No tenderness at A1 pulley of right thumb. No locking or catching of right thumb.  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 X-rays show minimal CMC joint arthritis. Likely early arthritis or tendinitis from overuse inflammation. NSAIDs limited by ulcers. - Prescribed Medrol  Dosepak for inflammation. - Advised thumb immobilization with a brace for two weeks. - Provided work note for brace use at work. - Suggested ice or heat for symptom relief. - Instructed to return for re-evaluation if no improvement in 2-3 weeks.  Follow-Up Instructions: No follow-ups on file.   Orders:  Orders Placed This Encounter  Procedures   XR Hand Complete Right   Meds ordered this encounter  Medications   methylPREDNISolone  (MEDROL  DOSEPAK) 4 MG TBPK tablet    Sig: Take 6 tablets by mouth on day 1, 5 tabs on day 2, 4 tabs on day 3,  3 tabs on day 4, 2 tabs on day 5, 1 tab on day 6. Then stop.    Dispense:  21 tablet    Refill:  0      Procedures: No procedures performed   Clinical Data: No additional findings.   Subjective: Chief Complaint  Patient presents with   Right Hand - Pain    Thumb pain    HPI  Review of Systems  Constitutional: Negative.   HENT: Negative.    Eyes: Negative.   Respiratory: Negative.    Cardiovascular: Negative.   Endocrine: Negative.   Musculoskeletal: Negative.   Neurological: Negative.   Hematological: Negative.   Psychiatric/Behavioral: Negative.    All other systems reviewed and are negative.    Objective: Vital Signs: There were no vitals taken for this visit.  Physical Exam Vitals and nursing note reviewed.  Constitutional:      Appearance: She is well-developed.  HENT:     Head: Atraumatic.     Nose: Nose normal.  Eyes:     Extraocular Movements: Extraocular movements intact.  Cardiovascular:     Pulses: Normal pulses.  Pulmonary:     Effort: Pulmonary effort is normal.  Abdominal:     Palpations: Abdomen is soft.  Musculoskeletal:     Cervical back: Neck supple.  Skin:    General: Skin is warm.  Capillary Refill: Capillary refill takes less than 2 seconds.  Neurological:     Mental Status: She is alert. Mental status is at baseline.  Psychiatric:        Behavior: Behavior normal.        Thought Content: Thought content normal.        Judgment: Judgment normal.     Ortho Exam  Specialty Comments:  No specialty comments available.  Imaging: XR Hand Complete Right Result Date: 07/20/2024 X-rays of the right hand show no acute or structural abnormalities.    PMFS History: Patient Active Problem List   Diagnosis Date Noted   Intertrigo 02/17/2024   Uterovaginal prolapse, incomplete 12/09/2023   Gastric ulcer 06/26/2023   Iron  deficiency 02/13/2023   Preventative health care 02/13/2023   Gastroesophageal reflux disease  02/13/2023   Shortness of breath 12/01/2022   Generalized obesity 06/05/2022   Intussusception intestine (HCC) 05/23/2022   Epigastric pain 04/26/2022   Anxiety, mild 07/18/2015   Recurrent major depressive disorder, in partial remission (HCC) 07/18/2015   Vitamin D  deficiency 09/15/2013   Migraine 07/25/2013   Anemia, iron  deficiency 06/15/2013   Perforated chronic gastric ulcer (HCC) 01/11/2013   Umbilical hernia 01/11/2013   Major depressive disorder, recurrent episode, mild (HCC) 11/03/2012   ADHD (attention deficit hyperactivity disorder), combined type 05/04/2012   Headache 01/03/2012   Lap revision of gastric bypass Jan 2013 after 2 prior marginal ulcer perforations 01/03/2012   Essential hypertension, benign 01/02/2012   Anemia 01/02/2012   Lap Bypass 2005 [facility]-comp by 2 perforations 12/05/2011   Status post bariatric surgery 12/05/2011   Incisional hernia 07/30/2011   Anxiety and depression 12/27/2009   Past Medical History:  Diagnosis Date   ADHD (attention deficit hyperactivity disorder)    Anemia    Anxiety with depression    Autism    Level 1   B12 deficiency    Back pain    Chronic fatigue    Depression with anxiety    Edema of both lower extremities    Excessive sleepiness    GAD (generalized anxiety disorder)    Gallbladder problem    GERD (gastroesophageal reflux disease)    Headache(784.0)    High blood pressure    History of gastric ulcer    partial ulcer perferation gastrojujunal anastomosis  post gastric bypass  -- s/p  surgical repair 2012 and 2013   History of palpitations in adulthood    per pt had occasional palpitations ,  told had PVCs   History of seizures as a child    febrile   History of stomach ulcers    IBS (irritable bowel syndrome)    IDA (iron  deficiency anemia)    Infertility, female    Insomnia    Insomnia    Irritable bowel syndrome with diarrhea    MDD (major depressive disorder)    Migraines    Plantar fasciitis    PONV  (postoperative nausea and vomiting)    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/  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;   anastomtic ulcer s/p bypass s/p repair x2 2012 and 2013;   Sleep apnea    SOBOE (shortness of breath on exertion)    SOBOE (shortness of breath on exertion)    SUI (stress urinary incontinence, female)    Wears glasses     Family History  Problem Relation Age of Onset   Stroke Mother    Hypertension  Mother    CVA Mother    Heart disease Mother        fibroelastoma on her heart, CAD, CABG   Cerebral aneurysm Mother    Sleep apnea Mother    Obesity Mother    Cancer Father    COPD Father    Liver cancer Father    Alcohol abuse Father    Alcoholism Father    Drug abuse Father    Drug abuse Brother    Alcohol abuse Brother    Drug abuse Brother    Alcohol abuse Brother    Heart disease Maternal Grandmother    Aneurysm Maternal Grandfather    Diabetes Paternal Grandmother    Drug abuse Son        in recovery   Asthma Son    Depression Son    ADD / ADHD Son    Anxiety disorder Son    Tourette syndrome Son    Anesthesia problems Neg Hx    Hypotension Neg Hx    Malignant hyperthermia Neg Hx    Pseudochol deficiency Neg Hx     Past Surgical History:  Procedure Laterality Date   BLADDER SUSPENSION N/A 12/09/2023   Procedure: TRANSVAGINAL TAPE (TVT) PROCEDURE;  Surgeon: Marilynne Rosaline SAILOR, MD;  Location: [facility];  Service: Gynecology;  Laterality: N/A;   COLONOSCOPY WITH PROPOFOL   2023   CYSTOSCOPY N/A 12/09/2023   Procedure: CYSTOSCOPY;  Surgeon: Marilynne Rosaline SAILOR, MD;  Location: [facility];  Service: Gynecology;  Laterality: N/A;   DILITATION & CURRETTAGE/HYSTROSCOPY WITH ESSURE  2015   bilateral tubal sterilization with essure   ESOPHAGOGASTRODUODENOSCOPY  11/28/2011   Procedure: ESOPHAGOGASTRODUODENOSCOPY (EGD);  Surgeon: Belvie JONETTA Just, MD;  Location: [facility];  Service:  Endoscopy;  Laterality: N/A;   LAPAROSCOPIC CHOLECYSTECTOMY  2006   LAPAROSCOPIC GASTROTOMY W/ REPAIR OF ULCER  10/23/2009   @[facility] ;   by dr s. gross;  EXTENSIVE LYSIS ADHESIONS/  OMENTAL PATCH OF ULCER   LAPAROSCOPY ABDOMEN DIAGNOSTIC  02/21/2011   @[facility]  by dr forbes. wilson/ dr b. sebastian;  upper endoscopy/  LAPAROSCOPIC OMENTAL PATCH REPAIR GASTROJEJUNAL ANASTOMTIC PERFORATION / PLACEMENT DRAIN   ROBOTIC ASSISTED LAPAROSCOPIC OVARIAN CYSTECTOMY Right 12/09/2023   Procedure: XI ROBOTIC ASSISTED LAPAROSCOPIC OVARIAN CYSTECTOMY;  Surgeon: Marilynne Rosaline SAILOR, MD;  Location: [facility];  Service: Gynecology;  Laterality: Right;   ROUX-EN-Y GASTRIC BYPASS  2005   @ [facility] by dr sherlean sous;  laparoscopic   VENTRAL HERNIA REPAIR  12/22/2011   Procedure: HERNIA REPAIR VENTRAL ADULT;  Surgeon: Donnice KATHEE Lunger, MD;  Location: [facility];  Service: General;;  RESECTION/ RE-DO  OF PRIOR GASTROJEJUNOSTOMY (GASTRIC BYPASS REVISION)   XI ROBOTIC ASSISTED SMALL BOWEL RESECTION  06/05/2022   @ [facility] by dr shaunna. chandler;  for intussusception intestine   XI ROBOTIC ASSISTED TOTAL HYSTERECTOMY WITH SACROCOLPOPEXY N/A 12/09/2023   Procedure: XI ROBOTIC ASSISTED TOTAL HYSTERECTOMY WITH BILATERAL SALPINGECTOMY AND SACROCOLPOPEXY;  Surgeon: Marilynne Rosaline SAILOR, MD;  Location: [facility];  Service: Gynecology;  Laterality: N/A;   Social History   Occupational History   Not on file  Tobacco Use   Smoking status: Never    Passive exposure: Past   Smokeless tobacco: Never  Vaping Use   Vaping status: Never Used  Substance and Sexual Activity   Alcohol use: No   Drug use: Never   Sexual activity: Yes    Birth control/protection: Surgical    Comment: hysteroscopic bilateral essure procedure

## 2024-07-21 NOTE — Progress Notes (Signed)
 [name], D.O.  AB[facility], [facility] Specializing in Clinical Bariatric Medicine  Office located at: [address] [place], [place]  [address]   Assessment and Plan:  No orders of the defined types were placed in this encounter.   Medications Discontinued During This Encounter  Medication Reason   Vitamin D , Ergocalciferol , (DRISDOL ) 1.25 MG (50000 UNIT) CAPS capsule Reorder     Meds ordered this encounter  Medications   Vitamin D , Ergocalciferol , (DRISDOL ) 1.25 MG (50000 UNIT) CAPS capsule    Sig: Take 1 capsule (50,000 Units total) by mouth every 7 (seven) days.    Dispense:  4 capsule    Refill:  0      FOR THE DISEASE OF OBESITY:  *** Assessment & Plan: Since last office visit on *** patient's muscle mass has {DID:29233} by ***lbs. Fat mass has {DID:29233} by ***lbs. Total body water  has {DID:29233} by ***lbs.  Body fat % has {DID:29233} by *** %. Counseling done on how various foods will affect these numbers and how to maximize success  Total lbs lost to date: *** lbs Total weight loss percentage to date: *** %   Recommended Dietary Goals [name] is currently in the action stage of change. As such, her goal is to continue weight management plan.  She has agreed to: {EMWTLOSSPLAN:29297::continue current plan}   Behavioral Intervention We discussed the following today: {dowtlossstrategies:31654}  Additional resources provided today: {DOhandouts:31655::None}  Evidence-based interventions for health behavior change were utilized today including the discussion of self monitoring techniques, problem-solving barriers and SMART goal setting techniques.   Regarding patient's less desirable eating habits and patterns, we employed the technique of small changes.   Pt will specifically work on: ***    Recommended Physical Activity Goals Adonia has been advised to work up to 300-450 minutes of moderate intensity aerobic activity a week and strengthening exercises  2-3 times per week for cardiovascular health, weight loss maintenance and preservation of muscle mass.   She has agreed to: {EMEXERCISE:28847::Think about enjoyable ways to increase daily physical activity and overcoming barriers to exercise,Increase physical activity in their day and reduce sedentary time (increase NEAT).,Increase volume of physical activity to a goal of 240 minutes a week,Combine aerobic and strengthening exercises for efficiency and improved cardiometabolic health.}   Pharmacotherapy We both agreed to: {EMagreedrx:29170}   ASSOCIATED CONDITIONS ADDRESSED TODAY:  ***   Follow up:   No follow-ups on file. *** She was informed of the importance of frequent follow up visits to maximize her success with intensive lifestyle modifications for her multiple health conditions.   Subjective:   Chief complaint: Obesity [name] is here to discuss her progress with her obesity treatment plan. She is on {MWMwtlossportion/plan2:23431} and states she is following her eating plan approximately ***% of the time. She states she is *** minutes *** days per week OR  *** not exercising (delete one)   Interval History:  [name] is here for a follow up office visit. Since last OV on *** , she is ***.       Pharmacotherapy that aid with weight loss: She is currently taking {EMPharmaco:28845}.    Review of Systems:  Pertinent positives were addressed with patient today.  Reviewed by clinician on day of visit: allergies, medications, problem list, medical history, surgical history, family history, social history, and previous encounter notes.  Weight Summary and Biometrics   No data recorded No data recorded ***  No data recorded No data recorded No data recorded  No data recorded  Objective:   PHYSICAL EXAM: Blood pressure 128/78, pulse 86, temperature 98 F (36.7 C), height 5' 7 (1.702 m), weight 235 lb (106.6 kg), SpO2 98%. Body mass index is 36.81  kg/m.  General: she is overweight, cooperative and in no acute distress. PSYCH: Has normal mood, affect and thought process.   HEENT: EOMI, sclerae are anicteric. Lungs: Normal breathing effort, no conversational dyspnea. Extremities: Moves * 4 Neurologic: A and O * 3, good insight  DIAGNOSTIC DATA REVIEWED: BMET    Component Value Date/Time   NA 136 06/02/2024 1006   K 4.4 06/02/2024 1006   CL 100 06/02/2024 1006   CO2 19 (L) 06/02/2024 1006   GLUCOSE 83 06/02/2024 1006   GLUCOSE 78 12/19/2011 0945   BUN 10 06/02/2024 1006   CREATININE 0.57 06/02/2024 1006   CALCIUM 9.0 06/02/2024 1006   GFRNONAA >90 12/22/2011 1415   GFRAA >90 12/22/2011 1415   Lab Results  Component Value Date   HGBA1C 5.1 06/02/2024   HGBA1C 5.2 02/17/2024   Lab Results  Component Value Date   INSULIN  4.2 06/02/2024   Lab Results  Component Value Date   TSH 2.530 06/02/2024   CBC    Component Value Date/Time   WBC 6.6 06/02/2024 1006   WBC 6.9 02/17/2024 1015   RBC 4.50 06/02/2024 1006   RBC 4.32 02/17/2024 1015   HGB 13.7 06/02/2024 1006   HCT 42.8 06/02/2024 1006   PLT 281 06/02/2024 1006   MCV 95 06/02/2024 1006   MCH 30.4 06/02/2024 1006   MCH 30.8 11/09/2023 0915   MCHC 32.0 06/02/2024 1006   MCHC 33.4 02/17/2024 1015   RDW 13.5 06/02/2024 1006   Iron  Studies    Component Value Date/Time   IRON  82 06/02/2024 1006   TIBC 333 06/02/2024 1006   FERRITIN 31 06/02/2024 1006   IRONPCTSAT 25 06/02/2024 1006   IRONPCTSAT 33 02/13/2023 0936   Lipid Panel     Component Value Date/Time   CHOL 147 06/02/2024 1006   TRIG 55 06/02/2024 1006   HDL 62 06/02/2024 1006   CHOLHDL 3 02/13/2023 0936   VLDL 28.8 02/13/2023 0936   LDLCALC 73 06/02/2024 1006   Hepatic Function Panel     Component Value Date/Time   PROT 6.3 06/02/2024 1006   ALBUMIN  4.0 06/02/2024 1006   AST 18 06/02/2024 1006   ALT 16 06/02/2024 1006   ALKPHOS 103 06/02/2024 1006   BILITOT 0.5 06/02/2024 1006       Component Value Date/Time   TSH 2.530 06/02/2024 1006   Nutritional Lab Results  Component Value Date   VD25OH 39.1 06/02/2024   VD25OH 35.82 02/17/2024   VD25OH 40.25 11/10/2022    Attestations:   I, ***, acting as a Stage manager for Christine Jenkins, DO., have compiled all relevant documentation for today's office visit on behalf of Christine Jenkins, DO, while in the presence of [employer], DO.  Reviewed by clinician on day of visit: allergies, medications, problem list, medical history, surgical history, family history, social history, and previous encounter notes pertinent to patient's obesity diagnosis.  I have spent *** minutes in the care of the patient today including *** minutes face-to-face assessing and reviewing listed medical problems above as outlined in office visit note and providing nutritional and behavioral counseling as outlined in obesity care plan.   I have reviewed the above documentation for accuracy and completeness, and I agree with the above. Christine PARAS , D.O.  The 21st Century Cures Act was signed into law in 2016 which includes the topic of electronic health records.  This provides immediate access to information in MyChart.  This includes consultation notes, operative notes, office notes, lab results and pathology reports.  If you have any questions about what you read please let us  know at your next visit so we can discuss your concerns and take corrective action if need be.  We are right here with you.

## 2024-07-24 ENCOUNTER — Other Ambulatory Visit: Payer: Self-pay

## 2024-07-26 ENCOUNTER — Other Ambulatory Visit: Payer: Self-pay

## 2024-07-26 ENCOUNTER — Other Ambulatory Visit (HOSPITAL_BASED_OUTPATIENT_CLINIC_OR_DEPARTMENT_OTHER): Payer: Self-pay

## 2024-07-26 MED ORDER — LORAZEPAM 1 MG PO TABS
1.0000 mg | ORAL_TABLET | Freq: Two times a day (BID) | ORAL | 0 refills | Status: DC | PRN
  Filled 2024-07-29: qty 60, 30d supply, fill #0

## 2024-07-26 MED ORDER — AMPHETAMINE-DEXTROAMPHETAMINE 20 MG PO TABS
20.0000 mg | ORAL_TABLET | Freq: Two times a day (BID) | ORAL | 0 refills | Status: DC
  Filled 2024-07-29: qty 60, 30d supply, fill #0

## 2024-07-26 MED ORDER — PRAMIPEXOLE DIHYDROCHLORIDE 1.5 MG PO TABS
1.5000 mg | ORAL_TABLET | Freq: Every day | ORAL | 0 refills | Status: AC
  Filled 2024-07-26: qty 30, 30d supply, fill #0

## 2024-07-26 MED ORDER — TRAZODONE HCL 100 MG PO TABS
100.0000 mg | ORAL_TABLET | Freq: Every evening | ORAL | 0 refills | Status: AC | PRN
  Filled 2024-07-26: qty 60, 30d supply, fill #0

## 2024-07-27 ENCOUNTER — Encounter: Payer: Self-pay | Admitting: Neurology

## 2024-07-27 ENCOUNTER — Ambulatory Visit: Admitting: Neurology

## 2024-07-27 ENCOUNTER — Other Ambulatory Visit (HOSPITAL_BASED_OUTPATIENT_CLINIC_OR_DEPARTMENT_OTHER): Payer: Self-pay

## 2024-07-27 VITALS — BP 129/97 | HR 89 | Ht 67.0 in | Wt 239.2 lb

## 2024-07-27 DIAGNOSIS — G4719 Other hypersomnia: Secondary | ICD-10-CM | POA: Diagnosis not present

## 2024-07-27 DIAGNOSIS — G47419 Narcolepsy without cataplexy: Secondary | ICD-10-CM | POA: Diagnosis not present

## 2024-07-27 DIAGNOSIS — G43909 Migraine, unspecified, not intractable, without status migrainosus: Secondary | ICD-10-CM

## 2024-07-27 MED ORDER — NURTEC 75 MG PO TBDP
ORAL_TABLET | ORAL | 11 refills | Status: AC
  Filled 2024-07-27: qty 16, 30d supply, fill #0
  Filled 2024-08-16 – 2024-09-12 (×2): qty 16, 30d supply, fill #1
  Filled 2024-10-29: qty 16, 30d supply, fill #2

## 2024-07-27 NOTE — Progress Notes (Signed)
 [facility]    Provider:  Dr Ines Requesting Provider: Daryl Setter, NP Primary Care Provider:  Daryl Setter, NP  CC:  excessive daytime somnolence  HPI:  [name] is a 48 y.o. female here as requested by Daryl Setter, NP for migraines. has Anxiety and depression; Incisional hernia; Lap Bypass 2005 [facility]U-comp by 2 perforations; Essential hypertension, benign; Anemia; Headache; Lap revision of gastric bypass Jan 2013 after 2 prior marginal ulcer perforations; ADHD (attention deficit hyperactivity disorder), combined type; Major depressive disorder, recurrent episode, mild (HCC); Shortness of breath; Iron  deficiency; Preventative health care; Gastroesophageal reflux disease; Gastric ulcer; Intussusception intestine (HCC); Perforated chronic gastric ulcer (HCC); Status post bariatric surgery; Anemia, iron  deficiency; Anxiety, mild; Epigastric pain; Migraine; Generalized obesity; Recurrent major depressive disorder, in partial remission (HCC); Umbilical hernia; Vitamin D  deficiency; Uterovaginal prolapse, incomplete; and Intertrigo on their problem list.  Migraines: For 12 years started after birth child.  Pulsating/pounding/throbbing, light and sound sensitivity, nausea, vomiting, hurts to move, migraines are moderate to severe and can last 8-24 hours or more, unilateral right sided behind the eye. Can last 4-12 hours. Doing well on Nurtec, as needed, she has 4 migraine days a month and < 8 headache total days a month. No aura. Doing well on nurtec.  Hypersomnia: has had sleep tests last 02/2023: She had a positive home sleep study in 2014 with [facility] she was started on cpap she felt better, the last several years sluggish, tired, (ESS 20) and FSS 77. 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, [facility], even feels like dozing when sitting at a stop light, she has insomnia she takes pramipexole , she takes trazodone  at night, she  has good sleep hygiene, no blue lights at bedtime, no problems falling asleep she goes at 10pm and waks about 5am unrefreshed, but she sleep 7-8 hours a night uninterupted usually occ gets up to urinate, mirapaex is not for restless legs but for depression she denies any restless legs, she snores but no witnessed apneic events, no fhx of narcolepsy, extreme feelings and urges to sleep during the day, ongoing for years, she struggle to stay awake, extensive bloodwork has been unrevealing including TSH, she will be laying down and not quite asleep but awake and in a dream-like state, she is cognizant that she is not sleeping but can;t get out of it during the day if lays down or naps, the urge is so strong to sleep she has to pull over like on a road trip she has had to pull over. No cataplexy. Has had sleep paralysis, no vivid dreams. Failed imitrex and maxalt .    Reviewed notes, labs and imaging from outside physicians, which showed:  Reviewed note from Jude Harden GAILS, MD   12/23/22 8:24 AM Note Home sleep test showed few events which did not reach significance for OSA Or oxygen levels showed mild to moderate drop, overall only 20 minutes less than 88% so do not feel that she needs nocturnal oxygen yet but I am not clear why oxygen level should drop during sleep We may have to check out her heart with echocardiogram, okay to order if she is willing Also please arrange 61-month follow-up visit to reassess   Labs 06/02/2024: phos,mag,vit d, tsh , hgba1c, cbc nml, cmp unremarkable essentially nml(co2 19)  From a thorough review of records and patient report, Medications tried that can be used in migraine/headache management greater than 3 months include: Lifestyle modification, headache diaries, better  sleep hygiene, exercise, management of migraine triggers, OTC and prescribed analgesics/nsaids such as ibuprofen , excedrin, alleve and others, celexa , amitrip/nortrip contraindicated bc on celexa  and risk  of seratonin syndrome, flexeril , cymbalta , robaxin , naproxin, zofran , nurtec, maxalt , imitrex, tizanidine , trazodone   CT head: Sunnie Hamilton, MD - 03/19/2014  Formatting of this note might be different from the original.  INDICATION: Headache 784.00   TECHNIQUE: CT BRAIN HEAD WO CONTRAST. Comparison none.   FINDINGS: Visualized portions of the paranasal sinuses are clear. There is no depressed skull fracture. There is no intracranial hemorrhage or hydrocephalus. There is no mass, mass effect, or midline shift. No CT evidence of acute infarct.   Review of Systems: Patient complains of symptoms per HPI as well as the following symptoms per hpi/none other. Pertinent negatives and positives per HPI. All others negative.   Social History   Socioeconomic History   Marital status: Married    Spouse name: Selinda   Number of children: 2   Years of education: Not on file   Highest education level: Bachelor's degree (e.g., BA, AB, BS)  Occupational History   Not on file  Tobacco Use   Smoking status: Never    Passive exposure: Past   Smokeless tobacco: Never  Vaping Use   Vaping status: Never Used  Substance and Sexual Activity   Alcohol use: No   Drug use: Never   Sexual activity: Yes    Birth control/protection: Surgical    Comment: hysteroscopic bilateral essure procedure  Other Topics Concern   Not on file  Social History Narrative   Married   2 sons (one is grown)   One son at home   Step daughter at home    2 step sons out of the house   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    Social Drivers of Corporate investment banker Strain: Low Risk  (02/10/2024)   Overall Financial Resource Strain (CARDIA)    Difficulty of Paying Living Expenses: Not hard at all  Food Insecurity: No Food Insecurity (02/10/2024)   Hunger Vital Sign    Worried About Running Out of Food in the Last Year: Never true    Ran  Out of Food in the Last Year: Never true  Transportation Needs: No Transportation Needs (02/10/2024)   PRAPARE - Administrator, Civil Service (Medical): No    Lack of Transportation (Non-Medical): No  Physical Activity: Inactive (02/10/2024)   Exercise Vital Sign    Days of Exercise per Week: 2 days    Minutes of Exercise per Session: 0 min  Stress: Stress Concern Present (02/10/2024)   [employer] of Occupational Health - Occupational Stress Questionnaire    Feeling of Stress : Very much  Social Connections: Socially Integrated (02/10/2024)   Social Connection and Isolation Panel    Frequency of Communication with Friends and Family: More than three times a week    Frequency of Social Gatherings with Friends and Family: More than three times a week    Attends Religious Services: 1 to 4 times per year    Active Member of [employer] or Organizations: Yes    Attends Banker Meetings: More than 4 times per year    Marital Status: Married  Intimate Partner Violence: Unknown (03/08/2023)   Received from [facility]   HITS    Physically Hurt: Not on file    Insult or Talk Down To:  Not on file    Threaten Physical Harm: Not on file    Scream or Curse: Not on file    Family History  Problem Relation Age of Onset   Stroke Mother    Hypertension Mother    CVA Mother    Heart disease Mother        fibroelastoma on her heart, CAD, CABG   Cerebral aneurysm Mother    Sleep apnea Mother    Obesity Mother    Cancer Father    COPD Father    Liver cancer Father    Alcohol abuse Father    Alcoholism Father    Drug abuse Father    Drug abuse Brother    Alcohol abuse Brother    Drug abuse Brother    Alcohol abuse Brother    Heart disease Maternal Grandmother    Aneurysm Maternal Grandfather    Diabetes Paternal Grandmother    Drug abuse Son        in recovery   Asthma Son    Depression Son    ADD / ADHD Son    Anxiety disorder Son    Tourette syndrome Son     Anesthesia problems Neg Hx    Hypotension Neg Hx    Malignant hyperthermia Neg Hx    Pseudochol deficiency Neg Hx    Migraines Neg Hx     Past Medical History:  Diagnosis Date   ADHD (attention deficit hyperactivity disorder)    Anemia    Anxiety with depression    Autism    Level 1   B12 deficiency    Back pain    Chronic fatigue    Depression with anxiety    Edema of both lower extremities    Excessive sleepiness    GAD (generalized anxiety disorder)    Gallbladder problem    GERD (gastroesophageal reflux disease)    Headache(784.0)    High blood pressure    History of gastric ulcer    partial ulcer perferation gastrojujunal anastomosis  post gastric bypass  -- s/p  surgical repair 2012 and 2013   History of palpitations in adulthood    per pt had occasional palpitations ,  told had PVCs   History of seizures as a child    febrile   History of stomach ulcers    IBS (irritable bowel syndrome)    IDA (iron  deficiency anemia)    Infertility, female    Insomnia    Insomnia    Irritable bowel syndrome with diarrhea    MDD (major depressive disorder)    Migraines    OSA (obstructive sleep apnea)    history  used cpap 2013-2014 now off.   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/  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;   anastomtic ulcer s/p bypass s/p repair x2 2012 and 2013;   Sleep apnea    SOBOE (shortness of breath on exertion)    SOBOE (shortness of breath on exertion)    SUI (stress urinary incontinence, female)    Wears glasses     Patient Active Problem List   Diagnosis Date Noted   Intertrigo 02/17/2024   Uterovaginal prolapse, incomplete 12/09/2023   Gastric ulcer 06/26/2023   Iron  deficiency 02/13/2023   Preventative health care 02/13/2023   Gastroesophageal reflux disease 02/13/2023   Shortness of breath 12/01/2022   Generalized  obesity 06/05/2022    Intussusception intestine (HCC) 05/23/2022   Epigastric pain 04/26/2022   Anxiety, mild 07/18/2015   Recurrent major depressive disorder, in partial remission (HCC) 07/18/2015   Vitamin D  deficiency 09/15/2013   Migraine 07/25/2013   Anemia, iron  deficiency 06/15/2013   Perforated chronic gastric ulcer (HCC) 01/11/2013   Umbilical hernia 01/11/2013   Major depressive disorder, recurrent episode, mild (HCC) 11/03/2012   ADHD (attention deficit hyperactivity disorder), combined type 05/04/2012   Headache 01/03/2012   Lap revision of gastric bypass Jan 2013 after 2 prior marginal ulcer perforations 01/03/2012   Essential hypertension, benign 01/02/2012   Anemia 01/02/2012   Lap Bypass 2005 [facility]-comp by 2 perforations 12/05/2011   Status post bariatric surgery 12/05/2011   Incisional hernia 07/30/2011   Anxiety and depression 12/27/2009    Past Surgical History:  Procedure Laterality Date   BLADDER SUSPENSION N/A 12/09/2023   Procedure: TRANSVAGINAL TAPE (TVT) PROCEDURE;  Surgeon: Marilynne Rosaline SAILOR, MD;  Location: [facility];  Service: Gynecology;  Laterality: N/A;   COLONOSCOPY WITH PROPOFOL   2023   CYSTOSCOPY N/A 12/09/2023   Procedure: CYSTOSCOPY;  Surgeon: Marilynne Rosaline SAILOR, MD;  Location: [facility];  Service: Gynecology;  Laterality: N/A;   DILITATION & CURRETTAGE/HYSTROSCOPY WITH ESSURE  2015   bilateral tubal sterilization with essure   ESOPHAGOGASTRODUODENOSCOPY  11/28/2011   Procedure: ESOPHAGOGASTRODUODENOSCOPY (EGD);  Surgeon: Belvie JONETTA Just, MD;  Location: [facility];  Service: Endoscopy;  Laterality: N/A;   LAPAROSCOPIC CHOLECYSTECTOMY  2006   LAPAROSCOPIC GASTROTOMY W/ REPAIR OF ULCER  10/23/2009   @[facility] ;   by dr s. gross;  EXTENSIVE LYSIS ADHESIONS/  OMENTAL PATCH OF ULCER   LAPAROSCOPY ABDOMEN DIAGNOSTIC  02/21/2011   @[facility]  by dr forbes. wilson/ dr b. sebastian;  upper endoscopy/  LAPAROSCOPIC OMENTAL PATCH REPAIR GASTROJEJUNAL  ANASTOMTIC PERFORATION / PLACEMENT DRAIN   ROBOTIC ASSISTED LAPAROSCOPIC OVARIAN CYSTECTOMY Right 12/09/2023   Procedure: XI ROBOTIC ASSISTED LAPAROSCOPIC OVARIAN CYSTECTOMY;  Surgeon: Marilynne Rosaline SAILOR, MD;  Location: [facility];  Service: Gynecology;  Laterality: Right;   ROUX-EN-Y GASTRIC BYPASS  2005   @ [facility] by dr sherlean sous;  laparoscopic   VENTRAL HERNIA REPAIR  12/22/2011   Procedure: HERNIA REPAIR VENTRAL ADULT;  Surgeon: Donnice KATHEE Lunger, MD;  Location: [facility];  Service: General;;  RESECTION/ RE-DO  OF PRIOR GASTROJEJUNOSTOMY (GASTRIC BYPASS REVISION)   XI ROBOTIC ASSISTED SMALL BOWEL RESECTION  06/05/2022   @ [facility] by dr shaunna. chandler;  for intussusception intestine   XI ROBOTIC ASSISTED TOTAL HYSTERECTOMY WITH SACROCOLPOPEXY N/A 12/09/2023   Procedure: XI ROBOTIC ASSISTED TOTAL HYSTERECTOMY WITH BILATERAL SALPINGECTOMY AND SACROCOLPOPEXY;  Surgeon: Marilynne Rosaline SAILOR, MD;  Location: [facility];  Service: Gynecology;  Laterality: N/A;    Current Outpatient Medications  Medication Sig Dispense Refill   acetaminophen  (TYLENOL ) 500 MG tablet Take 500 mg by mouth every 6 (six) hours as needed.     amphetamine -dextroamphetamine  (ADDERALL) 20 MG tablet Take one tablet twice daily 60 tablet 0   busPIRone  (BUSPAR ) 30 MG tablet Take 1 tablet (30 mg total) by mouth 2 (two) times daily with food. 60 tablet 3   Calcium Carb-Cholecalciferol (CHEWABLE CALCIUM/D3 PO) Take by mouth 2 (two) times daily. One chew twice daily     cyanocobalamin  500 MCG tablet Take 500 mcg by mouth daily.     DULoxetine  (CYMBALTA ) 30 MG capsule Take 3 capsules (90 mg total) by mouth daily. 90 capsule 3   estradiol  (  CLIMARA  - DOSED IN MG/24 HR) 0.1 mg/24hr patch Place 1 patch (0.1 mg total) onto the skin once a week. 4 patch 12   famotidine  (PEPCID ) 40 MG tablet Take 0.5 tablets (20 mg total) by mouth 2 (two) times daily. 60 tablet 5   Iron -FA-B Cmp-C-Biot-Probiotic (FUSION PLUS)  CAPS Take 1 capsule by mouth daily.     LORazepam  (ATIVAN ) 1 MG tablet Take 1 tablet (1 mg total) by mouth every 12 (twelve) hours as needed for severe anxiety. 60 tablet 3   Multiple Vitamins-Minerals (MULTIVITAMIN WITH MINERALS) tablet Take 1 tablet by mouth daily.     nystatin  powder Apply 1 Application topically 2 (two) times daily as needed. 30 g 5   pramipexole  (MIRAPEX ) 1.5 MG tablet Take one tablet at bedtime 30 tablet 3   traZODone  (DESYREL ) 100 MG tablet Take 1-2 tablets (100-200 mg total) by mouth at bedtime as needed for insomnia. 60 tablet 2   Vitamin D , Ergocalciferol , (DRISDOL ) 1.25 MG (50000 UNIT) CAPS capsule Take 1 capsule (50,000 Units total) by mouth every 7 (seven) days. 4 capsule 0   amphetamine -dextroamphetamine  (ADDERALL) 20 MG tablet Take 1 tablet (20 mg total) by mouth 2 (two) times daily 60 tablet 0   [START ON 07/28/2024] amphetamine -dextroamphetamine  (ADDERALL) 20 MG tablet Take 1 tablet (20 mg total) by mouth 2 (two) times daily. 60 tablet 0   busPIRone  (BUSPAR ) 30 MG tablet Take 1 tablet (30 mg total) by mouth 2 (two) times daily with food. 60 tablet 0   cyclobenzaprine  (FLEXERIL ) 5 MG tablet Take 5 mg by mouth 3 (three) times daily as needed for muscle spasms. (Patient not taking: Reported on 07/27/2024)     DULoxetine  (CYMBALTA ) 30 MG capsule Take 3 capsules (90 mg total) by mouth daily. 90 capsule 3   DULoxetine  (CYMBALTA ) 30 MG capsule Take 3 capsules (90 mg total) by mouth daily. 90 capsule 0   esomeprazole  (NEXIUM ) 40 MG capsule Take 1 capsule (40 mg total) by mouth daily. 90 capsule 3   LORazepam  (ATIVAN ) 1 MG tablet Take 1 tablet (1 mg total) by mouth every 12 (twelve) hours as needed for severe anxiety. 60 tablet 0   [START ON 07/28/2024] LORazepam  (ATIVAN ) 1 MG tablet Take 1 tablet (1 mg total) by mouth every 12 (twelve) hours as needed for severe anxiety. 60 tablet 0   pramipexole  (MIRAPEX ) 1.5 MG tablet Take 1 tablet (1.5 mg total) by mouth at bedtime. 30 tablet 3    pramipexole  (MIRAPEX ) 1.5 MG tablet Take 1 tablet (1.5 mg total) by mouth at bedtime. 30 tablet 0   pramipexole  (MIRAPEX ) 1.5 MG tablet Take 1 tablet (1.5 mg total) by mouth at bedtime. 30 tablet 0   Rimegepant Sulfate  (NURTEC) 75 MG TBDP Take 1 tablet by mouth once daily as needed for migraine 16 tablet 11   traZODone  (DESYREL ) 100 MG tablet Take 1-2 tablets (100-200 mg total) by mouth at bedtime as needed for insomnia. 60 tablet 2   traZODone  (DESYREL ) 100 MG tablet Take 1-2 tablets (100-200 mg total) by mouth at bedtime as needed for insomnia. 60 tablet 0   No current facility-administered medications for this visit.    Allergies as of 07/27/2024 - Review Complete 07/27/2024  Allergen Reaction Noted   Nsaids Other (See Comments) 03/30/2014    Vitals: BP (!) 129/97 (Cuff Size: Large)   Pulse 89   Ht 5' 7 (1.702 m)   Wt 239 lb 3.2 oz (108.5 kg)   BMI 37.46 kg/m  Last Weight:  Wt Readings from Last 1 Encounters:  07/27/24 239 lb 3.2 oz (108.5 kg)   Last Height:   Ht Readings from Last 1 Encounters:  07/27/24 5' 7 (1.702 m)     Physical exam: Exam: Gen: NAD, conversant, well nourised, obese, well groomed                     CV: RRR, no MRG. No Carotid Bruits. No peripheral edema, warm, nontender Eyes: Conjunctivae clear without exudates or hemorrhage  Neuro: Detailed Neurologic Exam  Speech:    Speech is normal; fluent and spontaneous with normal comprehension.  Cognition:    The patient is oriented to person, place, and time;     recent and remote memory intact;     language fluent;     normal attention, concentration,     fund of knowledge Cranial Nerves:    The pupils are equal, round, and reactive to light. The fundi are normal and spontaneous venous pulsations are present. Visual fields are full to finger confrontation. Extraocular movements are intact. Trigeminal sensation is intact and the muscles of mastication are normal. The face is symmetric. The palate  elevates in the midline. Hearing intact. Voice is normal. Shoulder shrug is normal. The tongue has normal motion without fasciculations.   Coordination: nml  Gait: nml  Motor Observation:    No asymmetry, no atrophy, and no involuntary movements noted. Tone:    Normal muscle tone.    Posture:    Posture is normal. normal erect    Strength:    Strength is V/V in the upper and lower limbs.      Sensation: intact to LT     Reflex Exam:  DTR's:    Deep tendon reflexes in the upper and lower extremities are normal bilaterally.   Toes:    The toes are downgoing bilaterally.   Clonus:    Clonus is absent.    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    Narcolepsy panel: Ess 20,  she will be laying down and not quite asleep but awake and in a dream-like state, she is cognizant that she is not sleeping but can;t get out of it during the day if lays down or naps, the urge is so strong to sleep she has to pull over like on a road trip she has had to pull over and nap. No cataplexy. Has had sleep paralysis, no vivid dreams but excessive somnolence ESS 20  Has morning headaches, should be in a sleep lab (not home test), also test for narcolepsy: Dr. Chalice.   Knows she will likely have to be off of adderall for evaluation:   Snoring, ESS 20, wakes up with headaches, last was a home sleep study and was negative however feel there may be narcolepsy (and recommend in lab study).  Discussed with  sleep team, made appt for sept 18th, ok that she had a home sleep test 02/2023 as tis is for narcolepsy and may need an  in-lab sleep test  Episodic migraines: Failed imitrex and maxalt . Continue nurtec  If primary hypersomnia, can discuss treating options with Dr. Chalice and I am happy to follow patient with Dr. Lionell recommendations (already on adderall, not sure what the next treatment would be), to discuss after evaluation, patient can mychart me as well  I spent 49 minutes of face-to-face and non-face-to-face time with patient on the  1. Excessive daytime sleepiness   2. evaluate for Primary narcolepsy without cataplexy   3. Migraine without status migrainosus, not intractable, unspecified migraine type    diagnosis.  This included previsit chart review, lab review, study review, order entry, electronic health record documentation, patient education on the different diagnostic and therapeutic options, counseling and coordination of care, risks and benefits of management, compliance, or risk factor reduction  Orders Placed This Encounter  Procedures   NARCOLEPSY EVALUATION   Ambulatory referral to Sleep Studies   Meds ordered this encounter  Medications   Rimegepant Sulfate  (NURTEC) 75 MG TBDP    Sig: Take 1 tablet by mouth once daily as needed for migraine    Dispense:  16 tablet    Refill:  11    Cc: Daryl Setter, NP,  Daryl Setter, NP  Onetha Epp, MD  [facility] [address] [place], [place] [address]  Phone [phone] Fax [phone]

## 2024-07-29 ENCOUNTER — Other Ambulatory Visit: Payer: Self-pay

## 2024-07-29 ENCOUNTER — Encounter: Payer: Self-pay | Admitting: Physician Assistant

## 2024-07-29 ENCOUNTER — Other Ambulatory Visit (HOSPITAL_BASED_OUTPATIENT_CLINIC_OR_DEPARTMENT_OTHER): Payer: Self-pay

## 2024-08-01 ENCOUNTER — Other Ambulatory Visit (HOSPITAL_BASED_OUTPATIENT_CLINIC_OR_DEPARTMENT_OTHER): Payer: Self-pay

## 2024-08-01 ENCOUNTER — Encounter (INDEPENDENT_AMBULATORY_CARE_PROVIDER_SITE_OTHER): Payer: Self-pay | Admitting: Family Medicine

## 2024-08-01 ENCOUNTER — Ambulatory Visit (INDEPENDENT_AMBULATORY_CARE_PROVIDER_SITE_OTHER): Admitting: Family Medicine

## 2024-08-01 VITALS — BP 109/77 | HR 78 | Temp 98.0°F | Ht 67.0 in | Wt 236.0 lb

## 2024-08-01 DIAGNOSIS — F439 Reaction to severe stress, unspecified: Secondary | ICD-10-CM | POA: Diagnosis not present

## 2024-08-01 DIAGNOSIS — Z6836 Body mass index (BMI) 36.0-36.9, adult: Secondary | ICD-10-CM | POA: Diagnosis not present

## 2024-08-01 DIAGNOSIS — E559 Vitamin D deficiency, unspecified: Secondary | ICD-10-CM | POA: Diagnosis not present

## 2024-08-01 DIAGNOSIS — E66812 Obesity, class 2: Secondary | ICD-10-CM

## 2024-08-01 MED ORDER — VITAMIN D (ERGOCALCIFEROL) 1.25 MG (50000 UNIT) PO CAPS
50000.0000 [IU] | ORAL_CAPSULE | ORAL | 0 refills | Status: DC
  Filled 2024-08-01 – 2024-09-12 (×2): qty 4, 28d supply, fill #0

## 2024-08-01 NOTE — Progress Notes (Signed)
 Office: [phone]  /  Fax: [phone]  WEIGHT SUMMARY AND BIOMETRICS  Anthropometric Measurements Height: 5' 7 (1.702 m) Weight: 236 lb (107 kg) BMI (Calculated): 36.95 Weight at Last Visit: 235 lb Weight Lost Since Last Visit: 0 Weight Gained Since Last Visit: 1 lb Starting Weight: 244 lb Total Weight Loss (lbs): 8 lb (3.629 kg) Peak Weight: 320 lb Waist Measurement : 54 inches   Body Composition  Body Fat %: 47.6 % Fat Mass (lbs): 112.6 lbs Muscle Mass (lbs): 117.6 lbs Total Body Water  (lbs): 86.8 lbs Visceral Fat Rating : 13   Other Clinical Data Fasting: no Labs: no Today's Visit #: 5 Starting Date: 06/02/24    Chief Complaint: OBESITY  History of Present Illness [name] is a 48 year old female who presents for obesity treatment and progress assessment.  She has been following a calorie-restricted diet plan, consuming 1300 to 1500 calories with an additional 100 grams of protein, achieving these dietary goals about 50% of the time. Despite these efforts, she has gained one pound in the last two weeks. She engages in physical activity by walking seven days a week for ten minutes each day. High stress levels have impacted her ability to consistently monitor her dietary intake. She has been better about not keeping cookies and chips at home, opting for healthier snacks like fruit.  Her sleep has been generally good, although she experienced poor sleep over the past weekend due to unspecified reasons. She is undergoing evaluation for possible narcolepsy due to excessive daytime sleepiness despite adequate nighttime sleep. She has an appointment with a sleep specialist on September 18th.  She is currently taking Cymbalta , which helps with psychological aspects but not with pain. She has been diagnosed with arthritis and tendinitis, for which she was given a splint and a steroid dose pack. She cannot take NSAIDs. She has a family history of osteoarthritis, as  her grandmother suffered from it, and she is concerned about her genetic predisposition to arthritis.  She has been recently diagnosed with autism and has a long-standing diagnosis of ADHD. She is working with a therapist to balance the traits of both conditions, particularly in managing perfectionist tendencies and impulsivity.      PHYSICAL EXAM:  Blood pressure 109/77, pulse 78, temperature 98 F (36.7 C), height 5' 7 (1.702 m), weight 236 lb (107 kg), SpO2 98%. Body mass index is 36.96 kg/m.  DIAGNOSTIC DATA REVIEWED:  BMET    Component Value Date/Time   NA 136 06/02/2024 1006   K 4.4 06/02/2024 1006   CL 100 06/02/2024 1006   CO2 19 (L) 06/02/2024 1006   GLUCOSE 83 06/02/2024 1006   GLUCOSE 78 12/19/2011 0945   BUN 10 06/02/2024 1006   CREATININE 0.57 06/02/2024 1006   CALCIUM 9.0 06/02/2024 1006   GFRNONAA >90 12/22/2011 1415   GFRAA >90 12/22/2011 1415   Lab Results  Component Value Date   HGBA1C 5.1 06/02/2024   HGBA1C 5.2 02/17/2024   Lab Results  Component Value Date   INSULIN  4.2 06/02/2024   Lab Results  Component Value Date   TSH 2.530 06/02/2024   CBC    Component Value Date/Time   WBC 6.6 06/02/2024 1006   WBC 6.9 02/17/2024 1015   RBC 4.50 06/02/2024 1006   RBC 4.32 02/17/2024 1015   HGB 13.7 06/02/2024 1006   HCT 42.8 06/02/2024 1006   PLT 281 06/02/2024 1006   MCV 95 06/02/2024 1006   MCH 30.4 06/02/2024  1006   MCH 30.8 11/09/2023 0915   MCHC 32.0 06/02/2024 1006   MCHC 33.4 02/17/2024 1015   RDW 13.5 06/02/2024 1006   Iron  Studies    Component Value Date/Time   IRON  82 06/02/2024 1006   TIBC 333 06/02/2024 1006   FERRITIN 31 06/02/2024 1006   IRONPCTSAT 25 06/02/2024 1006   IRONPCTSAT 33 02/13/2023 0936   Lipid Panel     Component Value Date/Time   CHOL 147 06/02/2024 1006   TRIG 55 06/02/2024 1006   HDL 62 06/02/2024 1006   CHOLHDL 3 02/13/2023 0936   VLDL 28.8 02/13/2023 0936   LDLCALC 73 06/02/2024 1006   Hepatic  Function Panel     Component Value Date/Time   PROT 6.3 06/02/2024 1006   ALBUMIN  4.0 06/02/2024 1006   AST 18 06/02/2024 1006   ALT 16 06/02/2024 1006   ALKPHOS 103 06/02/2024 1006   BILITOT 0.5 06/02/2024 1006      Component Value Date/Time   TSH 2.530 06/02/2024 1006   Nutritional Lab Results  Component Value Date   VD25OH 39.1 06/02/2024   VD25OH 35.82 02/17/2024   VD25OH 40.25 11/10/2022     Assessment and Plan Assessment & Plan Obesity, class 2 Obesity class 2 with recent weight gain of one pound over the last two weeks. She is following an adrenaline plan with a caloric intake of 1300-1500 calories and 100 grams of protein, achieving these goals about 50% of the time. Stress and life events have impacted adherence to the plan. Walking 10 minutes daily. Excessive daytime sleepiness is being evaluated for possible narcolepsy, which may impact metabolism and weight management. - Continue current dietary plan with 1300-1500 calories and 100 grams of protein - Encourage daily walking - Discussed strategies for meal planning and contingencies for unplanned events  Vitamin D  deficiency Vitamin D  deficiency requiring ongoing supplementation. Excessive daytime sleepiness reported and being evaluated.  - Refill vitamin D  prescription  Stress High stress levels related to family and personal responsibilities, impacting adherence to weight management plan. Counseling is ongoing for stress management. - Encourage counseling for stress management - Discussed importance of taking care of urgent business but remember to get back to taking care of her health.     Cristol was informed of the importance of frequent follow up visits to maximize her success with intensive lifestyle modifications for her obesity and obesity related health conditions as recommended by USPSTF and CMS guidelines   Louann Penton, MD

## 2024-08-03 ENCOUNTER — Encounter: Payer: Self-pay | Admitting: Family

## 2024-08-04 DIAGNOSIS — F4323 Adjustment disorder with mixed anxiety and depressed mood: Secondary | ICD-10-CM | POA: Diagnosis not present

## 2024-08-06 LAB — NARCOLEPSY EVALUATION
DQA1*01:02: NEGATIVE
DQB1*06:02: NEGATIVE

## 2024-08-08 ENCOUNTER — Ambulatory Visit: Payer: Self-pay | Admitting: *Deleted

## 2024-08-10 ENCOUNTER — Ambulatory Visit: Admitting: Orthopaedic Surgery

## 2024-08-10 ENCOUNTER — Telehealth: Payer: Self-pay | Admitting: Neurology

## 2024-08-10 ENCOUNTER — Ambulatory Visit: Admitting: Family

## 2024-08-10 VITALS — BP 101/67 | HR 91 | Temp 97.8°F | Resp 16 | Ht 67.0 in | Wt 241.0 lb

## 2024-08-10 DIAGNOSIS — M2550 Pain in unspecified joint: Secondary | ICD-10-CM

## 2024-08-10 DIAGNOSIS — M79644 Pain in right finger(s): Secondary | ICD-10-CM

## 2024-08-10 DIAGNOSIS — L304 Erythema intertrigo: Secondary | ICD-10-CM | POA: Diagnosis not present

## 2024-08-10 DIAGNOSIS — Z23 Encounter for immunization: Secondary | ICD-10-CM

## 2024-08-10 MED ORDER — LIDOCAINE HCL 1 % IJ SOLN
0.3000 mL | INTRAMUSCULAR | Status: AC | PRN
  Administered 2024-08-10: .3 mL

## 2024-08-10 MED ORDER — METHYLPREDNISOLONE ACETATE 40 MG/ML IJ SUSP
0.3000 mg | INTRAMUSCULAR | Status: AC | PRN
  Administered 2024-08-10: .3 mg

## 2024-08-10 MED ORDER — BUPIVACAINE HCL 0.5 % IJ SOLN
0.3000 mL | INTRAMUSCULAR | Status: AC | PRN
  Administered 2024-08-10: .3 mL

## 2024-08-10 NOTE — Telephone Encounter (Signed)
 LVM and sent MyChart msg informing pt of r/s needed for 9/18 sleep consult- MD out.

## 2024-08-10 NOTE — Progress Notes (Signed)
   Office Visit Note   Patient: [name]           Date of Birth: [birth year]           MRN: [id number] Visit Date: 08/10/2024              Requested by: Daryl Setter, NP [id number] [place],  [place] [address] PCP: Daryl Setter, NP   Assessment & Plan: Visit Diagnoses:  1. Pain of right thumb     Plan: History of Present Illness [name] is a 48 year old female who presents with persistent right thumb joint pain.  She experiences sharp and sore pain at the base of her thumb, especially during pinching or grasping activities. The pain persists despite the use of prednisone. She continues to use NSAIDs for pain relief, which is effective for her, despite her history of ulcers and gastric bypass.  Physical Exam MUSCULOSKELETAL: Pain at the base of the thumb joint and on pinching and CMC grind test.  Assessment and Plan Chronic right thumb base pain Chronic pain at the base of the right thumb, unresponsive to prednisone and NSAIDs, with functional impairment. - Administered corticosteroid injection at the [facility] joint of the right thumb. - wear CMC brace for a week then wean as tolerated.  Follow-Up Instructions: No follow-ups on file.   Orders:  No orders of the defined types were placed in this encounter.  No orders of the defined types were placed in this encounter.     Procedures: Hand/UE Inj: R thumb CMC for osteoarthritis on 08/10/2024 8:37 AM Indications: pain Details: 25 G needle Medications: 0.3 mL lidocaine  1 %; 0.3 mg methylPREDNISolone  acetate 40 MG/ML; 0.3 mL bupivacaine  0.5 % Outcome: tolerated well, no immediate complications Patient was prepped and draped in the usual sterile fashion.       Clinical Data: No additional findings.   Subjective: Chief Complaint  Patient presents with   Right Thumb - Pain

## 2024-08-10 NOTE — Assessment & Plan Note (Signed)
 Stable on pepcid /nexium .  Feels that she needs both meds. Continue same.

## 2024-08-10 NOTE — Progress Notes (Unsigned)
 Subjective:     Patient ID: [name], female    DOB: February 02, 1976, 48 y.o.   MRN: [id number]  Chief Complaint  Patient presents with   Joint Pain    Patient complains of joint pain, mostly on both hands    HPI  Discussed the use of AI scribe software for clinical note transcription with the patient, who gave verbal consent to proceed.  History of Present Illness  [name] is a 48 year old female with osteoarthritis who presents with joint pain.  She experiences joint pain primarily in her hands, especially at the base of her thumb, diagnosed as osteoarthritis. The pain affects her range of motion and limits the use of her right hand, despite being left-handed. She uses a brace for support and has received a painful steroid injection.  Her grandmother had severe osteoarthritis, requiring knee replacements and back surgery. She is concerned about developing arthritis and cannot take NSAIDs due to a history of gastric bypass and ulcers.  She is currently taking Pepcid  and Nexium  for reflux, which is well controlled with occasional flare-ups requiring Tums. She uses a powder for skin folds to manage rashes that flare up, particularly in the summer.     Health Maintenance Due  Topic Date Due   COVID-19 Vaccine (5 - 2025-26 season) 07/25/2024    Past Medical History:  Diagnosis Date   ADHD (attention deficit hyperactivity disorder)    Anemia    Anxiety with depression    Autism    Level 1   B12 deficiency    Back pain    Chronic fatigue    Depression with anxiety    Edema of both lower extremities    Excessive sleepiness    GAD (generalized anxiety disorder)    Gallbladder problem    GERD (gastroesophageal reflux disease)    Headache(784.0)    High blood pressure    History of gastric ulcer    partial ulcer perferation gastrojujunal anastomosis  post gastric bypass  -- s/p  surgical repair 2012 and 2013   History of palpitations in adulthood    per pt had  occasional palpitations ,  told had PVCs   History of seizures as a child    febrile   History of stomach ulcers    IBS (irritable bowel syndrome)    IDA (iron  deficiency anemia)    Infertility, female    Insomnia    Insomnia    Irritable bowel syndrome with diarrhea    MDD (major depressive disorder)    Migraines    OSA (obstructive sleep apnea)    history  used cpap 2013-2014 now off.   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/  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;   anastomtic ulcer s/p bypass s/p repair x2 2012 and 2013;   Sleep apnea    SOBOE (shortness of breath on exertion)    SOBOE (shortness of breath on exertion)    SUI (stress urinary incontinence, female)    Wears glasses     Past Surgical History:  Procedure Laterality Date   BLADDER SUSPENSION N/A 12/09/2023   Procedure: TRANSVAGINAL TAPE (TVT) PROCEDURE;  Surgeon: Marilynne Rosaline SAILOR, MD;  Location: [facility];  Service: Gynecology;  Laterality: N/A;   COLONOSCOPY WITH PROPOFOL   2023   CYSTOSCOPY N/A 12/09/2023   Procedure: CYSTOSCOPY;  Surgeon: Marilynne Rosaline SAILOR, MD;  Location: [facility];  Service: Gynecology;  Laterality: N/A;   DILITATION & CURRETTAGE/HYSTROSCOPY WITH ESSURE  2015   bilateral tubal sterilization with essure   ESOPHAGOGASTRODUODENOSCOPY  11/28/2011   Procedure: ESOPHAGOGASTRODUODENOSCOPY (EGD);  Surgeon: Belvie JONETTA Just, MD;  Location: [facility];  Service: Endoscopy;  Laterality: N/A;   LAPAROSCOPIC CHOLECYSTECTOMY  2006   LAPAROSCOPIC GASTROTOMY W/ REPAIR OF ULCER  10/23/2009   @[facility] ;   by dr s. gross;  EXTENSIVE LYSIS ADHESIONS/  OMENTAL PATCH OF ULCER   LAPAROSCOPY ABDOMEN DIAGNOSTIC  02/21/2011   @[facility]  by dr forbes. wilson/ dr b. sebastian;  upper endoscopy/  LAPAROSCOPIC OMENTAL PATCH REPAIR GASTROJEJUNAL ANASTOMTIC PERFORATION / PLACEMENT DRAIN   ROBOTIC  ASSISTED LAPAROSCOPIC OVARIAN CYSTECTOMY Right 12/09/2023   Procedure: XI ROBOTIC ASSISTED LAPAROSCOPIC OVARIAN CYSTECTOMY;  Surgeon: Marilynne Rosaline SAILOR, MD;  Location: [facility];  Service: Gynecology;  Laterality: Right;   ROUX-EN-Y GASTRIC BYPASS  2005   @ [facility] by dr sherlean sous;  laparoscopic   VENTRAL HERNIA REPAIR  12/22/2011   Procedure: HERNIA REPAIR VENTRAL ADULT;  Surgeon: Donnice KATHEE Lunger, MD;  Location: [facility];  Service: General;;  RESECTION/ RE-DO  OF PRIOR GASTROJEJUNOSTOMY (GASTRIC BYPASS REVISION)   XI ROBOTIC ASSISTED SMALL BOWEL RESECTION  06/05/2022   @ [facility] by dr shaunna. chandler;  for intussusception intestine   XI ROBOTIC ASSISTED TOTAL HYSTERECTOMY WITH SACROCOLPOPEXY N/A 12/09/2023   Procedure: XI ROBOTIC ASSISTED TOTAL HYSTERECTOMY WITH BILATERAL SALPINGECTOMY AND SACROCOLPOPEXY;  Surgeon: Marilynne Rosaline SAILOR, MD;  Location: [facility];  Service: Gynecology;  Laterality: N/A;    Family History  Problem Relation Age of Onset   Stroke Mother    Hypertension Mother    CVA Mother    Heart disease Mother        fibroelastoma on her heart, CAD, CABG   Cerebral aneurysm Mother    Sleep apnea Mother    Obesity Mother    Cancer Father    COPD Father    Liver cancer Father    Alcohol abuse Father    Alcoholism Father    Drug abuse Father    Drug abuse Brother    Alcohol abuse Brother    Drug abuse Brother    Alcohol abuse Brother    Heart disease Maternal Grandmother    Aneurysm Maternal Grandfather    Diabetes Paternal Grandmother    Drug abuse Son        in recovery   Asthma Son    Depression Son    ADD / ADHD Son    Anxiety disorder Son    Tourette syndrome Son    Anesthesia problems Neg Hx    Hypotension Neg Hx    Malignant hyperthermia Neg Hx    Pseudochol deficiency Neg Hx    Migraines Neg Hx     Social History   Socioeconomic History   Marital status: Married    Spouse name: Selinda   Number of children: 2    Years of education: Not on file   Highest education level: Bachelor's degree (e.g., BA, AB, BS)  Occupational History   Not on file  Tobacco Use   Smoking status: Never    Passive exposure: Past   Smokeless tobacco: Never  Vaping Use   Vaping status: Never Used  Substance and Sexual Activity   Alcohol use: No   Drug use: Never   Sexual activity: Yes    Birth control/protection: Surgical  Comment: hysteroscopic bilateral essure procedure  Other Topics Concern   Not on file  Social History Narrative   Married   2 sons (one is grown)   One son at home   Step daughter at home    2 step sons out of the house   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    Social Drivers of Corporate investment banker Strain: Low Risk  (08/04/2024)   Overall Financial Resource Strain (CARDIA)    Difficulty of Paying Living Expenses: Not hard at all  Food Insecurity: No Food Insecurity (08/04/2024)   Hunger Vital Sign    Worried About Running Out of Food in the Last Year: Never true    Ran Out of Food in the Last Year: Never true  Transportation Needs: No Transportation Needs (08/04/2024)   PRAPARE - Administrator, Civil Service (Medical): No    Lack of Transportation (Non-Medical): No  Physical Activity: Insufficiently Active (08/04/2024)   Exercise Vital Sign    Days of Exercise per Week: 3 days    Minutes of Exercise per Session: 10 min  Stress: No Stress Concern Present (08/04/2024)   [employer] of Occupational Health - Occupational Stress Questionnaire    Feeling of Stress: Only a little  Social Connections: Socially Integrated (08/04/2024)   Social Connection and Isolation Panel    Frequency of Communication with Friends and Family: Once a week    Frequency of Social Gatherings with Friends and Family: More than three times a week    Attends Religious Services: 1 to 4 times per year    Active Member  of [employer] or Organizations: Yes    Attends Banker Meetings: More than 4 times per year    Marital Status: Married  Catering manager Violence: Unknown (03/08/2023)   Received from [facility]   HITS    Physically Hurt: Not on file    Insult or Talk Down To: Not on file    Threaten Physical Harm: Not on file    Scream or Curse: Not on file    Outpatient Medications Prior to Visit  Medication Sig Dispense Refill   acetaminophen  (TYLENOL ) 500 MG tablet Take 500 mg by mouth every 6 (six) hours as needed.     amphetamine -dextroamphetamine  (ADDERALL) 20 MG tablet Take one tablet twice daily 60 tablet 0   amphetamine -dextroamphetamine  (ADDERALL) 20 MG tablet Take 1 tablet (20 mg total) by mouth 2 (two) times daily 60 tablet 0   amphetamine -dextroamphetamine  (ADDERALL) 20 MG tablet Take 1 tablet (20 mg total) by mouth 2 (two) times daily. 60 tablet 0   busPIRone  (BUSPAR ) 30 MG tablet Take 1 tablet (30 mg total) by mouth 2 (two) times daily with food. 60 tablet 3   busPIRone  (BUSPAR ) 30 MG tablet Take 1 tablet (30 mg total) by mouth 2 (two) times daily with food. 60 tablet 0   Calcium Carb-Cholecalciferol (CHEWABLE CALCIUM/D3 PO) Take by mouth 2 (two) times daily. One chew twice daily     cyanocobalamin  500 MCG tablet Take 500 mcg by mouth daily.     cyclobenzaprine  (FLEXERIL ) 5 MG tablet Take 5 mg by mouth 3 (three) times daily as needed for muscle spasms.     DULoxetine  (CYMBALTA ) 30 MG capsule Take 3 capsules (90 mg total) by mouth daily. 90 capsule 3   DULoxetine  (CYMBALTA ) 30 MG capsule Take 3 capsules (90 mg total) by  mouth daily. 90 capsule 3   DULoxetine  (CYMBALTA ) 30 MG capsule Take 3 capsules (90 mg total) by mouth daily. 90 capsule 0   esomeprazole  (NEXIUM ) 40 MG capsule Take 1 capsule (40 mg total) by mouth daily. 90 capsule 3   estradiol  (CLIMARA  - DOSED IN MG/24 HR) 0.1 mg/24hr patch Place 1 patch (0.1 mg total) onto the skin once a week. 4 patch 12   famotidine  (PEPCID )  40 MG tablet Take 0.5 tablets (20 mg total) by mouth 2 (two) times daily. 60 tablet 5   Iron -FA-B Cmp-C-Biot-Probiotic (FUSION PLUS) CAPS Take 1 capsule by mouth daily.     LORazepam  (ATIVAN ) 1 MG tablet Take 1 tablet (1 mg total) by mouth every 12 (twelve) hours as needed for severe anxiety. 60 tablet 3   LORazepam  (ATIVAN ) 1 MG tablet Take 1 tablet (1 mg total) by mouth every 12 (twelve) hours as needed for severe anxiety. 60 tablet 0   LORazepam  (ATIVAN ) 1 MG tablet Take 1 tablet (1 mg total) by mouth every 12 (twelve) hours as needed for severe anxiety. 60 tablet 0   Multiple Vitamins-Minerals (MULTIVITAMIN WITH MINERALS) tablet Take 1 tablet by mouth daily.     nystatin  powder Apply 1 Application topically 2 (two) times daily as needed. 30 g 5   pramipexole  (MIRAPEX ) 1.5 MG tablet Take 1 tablet (1.5 mg total) by mouth at bedtime. 30 tablet 3   pramipexole  (MIRAPEX ) 1.5 MG tablet Take one tablet at bedtime 30 tablet 3   pramipexole  (MIRAPEX ) 1.5 MG tablet Take 1 tablet (1.5 mg total) by mouth at bedtime. 30 tablet 0   pramipexole  (MIRAPEX ) 1.5 MG tablet Take 1 tablet (1.5 mg total) by mouth at bedtime. 30 tablet 0   Rimegepant Sulfate  (NURTEC) 75 MG TBDP Take 1 tablet by mouth once daily as needed for migraine 16 tablet 11   traZODone  (DESYREL ) 100 MG tablet Take 1-2 tablets (100-200 mg total) by mouth at bedtime as needed for insomnia. 60 tablet 2   traZODone  (DESYREL ) 100 MG tablet Take 1-2 tablets (100-200 mg total) by mouth at bedtime as needed for insomnia. 60 tablet 2   traZODone  (DESYREL ) 100 MG tablet Take 1-2 tablets (100-200 mg total) by mouth at bedtime as needed for insomnia. 60 tablet 0   Vitamin D , Ergocalciferol , (DRISDOL ) 1.25 MG (50000 UNIT) CAPS capsule Take 1 capsule (50,000 Units total) by mouth every 7 (seven) days. 4 capsule 0   No facility-administered medications prior to visit.    Allergies  Allergen Reactions   Nsaids Other (See Comments)    Avoids due to history  gastric ulcers    ROS See HPI    Objective:    Physical Exam Constitutional:      General: She is not in acute distress.    Appearance: Normal appearance. She is well-developed.  HENT:     Head: Normocephalic and atraumatic.     Right Ear: External ear normal.     Left Ear: External ear normal.  Eyes:     General: No scleral icterus. Neck:     Thyroid : No thyromegaly.  Cardiovascular:     Rate and Rhythm: Normal rate and regular rhythm.     Heart sounds: Normal heart sounds. No murmur heard. Pulmonary:     Effort: Pulmonary effort is normal. No respiratory distress.     Breath sounds: Normal breath sounds. No wheezing.  Musculoskeletal:     Cervical back: Neck supple.     Comments: Wearing right thumb  brace  Skin:    General: Skin is warm and dry.  Neurological:     Mental Status: She is alert and oriented to person, place, and time.  Psychiatric:        Mood and Affect: Mood normal.        Behavior: Behavior normal.        Thought Content: Thought content normal.        Judgment: Judgment normal.      BP 101/67 (BP Location: Right Arm, Patient Position: Sitting, Cuff Size: Normal)   Pulse 91   Temp 97.8 F (36.6 C) (Oral)   Resp 16   Ht 5' 7 (1.702 m)   Wt 241 lb (109.3 kg)   SpO2 98%   BMI 37.75 kg/m  Wt Readings from Last 3 Encounters:  08/10/24 241 lb (109.3 kg)  08/01/24 236 lb (107 kg)  07/27/24 239 lb 3.2 oz (108.5 kg)       Assessment & Plan:   Problem List Items Addressed This Visit       Unprioritized   Intertrigo    Intertrigo managed with nystatin  powder, occasional flare-ups. - Continue use of medicated powder as needed for flare-ups.      Arthralgia - Primary   Osteoarthritis of right thumb and hand Osteoarthritis with differential diagnosis of tendinitis. Significant pain affecting range of motion. Oral NSAIDs contraindicated due to gastric bypass and hx ulcers. - Order autoimmune testing to exclude rheumatoid arthritis or  other autoimmune causes. - Consider referral to rheumatology if autoimmune tests are abnormal. - Recommend topical NSAIDs like Voltaren for pain management or tylenol .      Relevant Orders   Rheumatoid Factor (Completed)   Antinuclear Antib (ANA)   Sedimentation rate (Completed)   Other Visit Diagnoses       Needs flu shot       Relevant Orders   Flu vaccine trivalent PF, 6mos and older(Flulaval,Afluria,Fluarix,Fluzone) (Completed)     GERD- stable on pepcid /nexium .  I am having Lyrica L. Giraldo maintain her cyanocobalamin , Calcium Carb-Cholecalciferol (CHEWABLE CALCIUM/D3 PO), Fusion Plus, pramipexole , traZODone , DULoxetine , LORazepam , nystatin , multivitamin with minerals, esomeprazole , amphetamine -dextroamphetamine , busPIRone , DULoxetine , pramipexole , traZODone , famotidine , cyclobenzaprine , acetaminophen , estradiol , busPIRone , DULoxetine , pramipexole , amphetamine -dextroamphetamine , LORazepam , amphetamine -dextroamphetamine , LORazepam , pramipexole , traZODone , Nurtec, and Vitamin D  (Ergocalciferol ).  No orders of the defined types were placed in this encounter.

## 2024-08-11 ENCOUNTER — Institutional Professional Consult (permissible substitution): Admitting: Neurology

## 2024-08-11 DIAGNOSIS — M2550 Pain in unspecified joint: Secondary | ICD-10-CM | POA: Insufficient documentation

## 2024-08-11 LAB — SEDIMENTATION RATE: Sed Rate: 18 mm/h (ref 0–30)

## 2024-08-11 NOTE — Assessment & Plan Note (Signed)
  Intertrigo managed with nystatin  powder, occasional flare-ups. - Continue use of medicated powder as needed for flare-ups.

## 2024-08-11 NOTE — Assessment & Plan Note (Signed)
 Osteoarthritis of right thumb and hand Osteoarthritis with differential diagnosis of tendinitis. Significant pain affecting range of motion. Oral NSAIDs contraindicated due to gastric bypass and hx ulcers. - Order autoimmune testing to exclude rheumatoid arthritis or other autoimmune causes. - Consider referral to rheumatology if autoimmune tests are abnormal. - Recommend topical NSAIDs like Voltaren for pain management or tylenol .

## 2024-08-11 NOTE — Patient Instructions (Signed)
 VISIT SUMMARY:  You visited us  today for joint pain in your hands, particularly at the base of your thumb, which has been diagnosed as osteoarthritis. We also discussed your well-controlled GERD and occasional skin rashes.  YOUR PLAN:  OSTEOARTHRITIS OF RIGHT THUMB AND HAND: You have significant pain in your right thumb and hand, which affects your range of motion. Oral NSAIDs are not an option for you due to your medical history. -We will order autoimmune testing to rule out rheumatoid arthritis or other autoimmune causes. -If the autoimmune tests are abnormal, we may refer you to a rheumatologist. -We recommend using topical NSAIDs like Voltaren for pain management.  GASTROESOPHAGEAL REFLUX DISEASE (GERD): Your GERD is well-controlled with your current medications, but you do experience occasional flare-ups. -Continue taking Pepcid  and Nexium  as prescribed. -Use Tums for occasional flare-ups.  INTERTRIGO: You have occasional skin rashes, particularly in the summer, which are managed with a medicated powder. -Continue using the medicated powder as needed for flare-ups.

## 2024-08-13 LAB — ANA: Anti Nuclear Antibody (ANA): NEGATIVE

## 2024-08-13 LAB — RHEUMATOID FACTOR: Rheumatoid fact SerPl-aCnc: <10 [IU]/mL (ref <14)

## 2024-08-14 ENCOUNTER — Ambulatory Visit: Payer: Self-pay | Admitting: Family

## 2024-08-15 DIAGNOSIS — F4323 Adjustment disorder with mixed anxiety and depressed mood: Secondary | ICD-10-CM | POA: Diagnosis not present

## 2024-08-16 ENCOUNTER — Other Ambulatory Visit (HOSPITAL_BASED_OUTPATIENT_CLINIC_OR_DEPARTMENT_OTHER): Payer: Self-pay

## 2024-08-18 ENCOUNTER — Other Ambulatory Visit (HOSPITAL_BASED_OUTPATIENT_CLINIC_OR_DEPARTMENT_OTHER): Payer: Self-pay

## 2024-08-18 MED ORDER — COMIRNATY 30 MCG/0.3ML IM SUSY
0.3000 mL | PREFILLED_SYRINGE | Freq: Once | INTRAMUSCULAR | 0 refills | Status: AC
Start: 2024-08-18 — End: 2024-08-19
  Filled 2024-08-18: qty 0.3, 1d supply, fill #0

## 2024-08-21 ENCOUNTER — Encounter (INDEPENDENT_AMBULATORY_CARE_PROVIDER_SITE_OTHER): Payer: Self-pay | Admitting: Family Medicine

## 2024-08-22 ENCOUNTER — Ambulatory Visit (INDEPENDENT_AMBULATORY_CARE_PROVIDER_SITE_OTHER): Admitting: Family Medicine

## 2024-08-22 ENCOUNTER — Other Ambulatory Visit (HOSPITAL_BASED_OUTPATIENT_CLINIC_OR_DEPARTMENT_OTHER): Payer: Self-pay

## 2024-08-22 ENCOUNTER — Encounter (INDEPENDENT_AMBULATORY_CARE_PROVIDER_SITE_OTHER): Payer: Self-pay

## 2024-08-23 ENCOUNTER — Other Ambulatory Visit (HOSPITAL_BASED_OUTPATIENT_CLINIC_OR_DEPARTMENT_OTHER): Payer: Self-pay

## 2024-08-23 ENCOUNTER — Other Ambulatory Visit: Payer: Self-pay

## 2024-08-26 ENCOUNTER — Other Ambulatory Visit (HOSPITAL_BASED_OUTPATIENT_CLINIC_OR_DEPARTMENT_OTHER): Payer: Self-pay

## 2024-08-26 DIAGNOSIS — F840 Autistic disorder: Secondary | ICD-10-CM | POA: Diagnosis not present

## 2024-08-26 DIAGNOSIS — F411 Generalized anxiety disorder: Secondary | ICD-10-CM | POA: Diagnosis not present

## 2024-08-26 DIAGNOSIS — F3341 Major depressive disorder, recurrent, in partial remission: Secondary | ICD-10-CM | POA: Diagnosis not present

## 2024-08-26 DIAGNOSIS — F900 Attention-deficit hyperactivity disorder, predominantly inattentive type: Secondary | ICD-10-CM | POA: Diagnosis not present

## 2024-08-26 MED ORDER — TRAZODONE HCL 100 MG PO TABS
100.0000 mg | ORAL_TABLET | Freq: Every evening | ORAL | 2 refills | Status: AC | PRN
  Filled 2024-08-26: qty 60, 30d supply, fill #0
  Filled 2024-09-25: qty 60, 30d supply, fill #1

## 2024-08-26 MED ORDER — AMPHETAMINE-DEXTROAMPHETAMINE 20 MG PO TABS
20.0000 mg | ORAL_TABLET | Freq: Two times a day (BID) | ORAL | 0 refills | Status: DC
  Filled 2024-09-25: qty 60, 30d supply, fill #0

## 2024-08-26 MED ORDER — DULOXETINE HCL 30 MG PO CPEP
90.0000 mg | ORAL_CAPSULE | Freq: Every day | ORAL | 2 refills | Status: AC
  Filled 2024-08-26 – 2024-09-25 (×2): qty 90, 30d supply, fill #0

## 2024-08-26 MED ORDER — PRAMIPEXOLE DIHYDROCHLORIDE 1.5 MG PO TABS
1.5000 mg | ORAL_TABLET | Freq: Every day | ORAL | 2 refills | Status: AC
  Filled 2024-08-26 – 2024-09-15 (×3): qty 30, 30d supply, fill #0
  Filled 2024-10-12: qty 30, 30d supply, fill #1
  Filled 2024-12-14: qty 30, 30d supply, fill #2

## 2024-08-26 MED ORDER — AMPHETAMINE-DEXTROAMPHETAMINE 20 MG PO TABS
20.0000 mg | ORAL_TABLET | Freq: Two times a day (BID) | ORAL | 0 refills | Status: DC
  Filled 2024-08-27: qty 60, 30d supply, fill #0

## 2024-08-27 ENCOUNTER — Other Ambulatory Visit (HOSPITAL_BASED_OUTPATIENT_CLINIC_OR_DEPARTMENT_OTHER): Payer: Self-pay

## 2024-08-29 ENCOUNTER — Other Ambulatory Visit: Payer: Self-pay

## 2024-08-29 DIAGNOSIS — F4323 Adjustment disorder with mixed anxiety and depressed mood: Secondary | ICD-10-CM | POA: Diagnosis not present

## 2024-08-30 ENCOUNTER — Telehealth: Payer: Self-pay | Admitting: Neurology

## 2024-08-30 NOTE — Telephone Encounter (Signed)
 error

## 2024-09-09 ENCOUNTER — Other Ambulatory Visit (HOSPITAL_BASED_OUTPATIENT_CLINIC_OR_DEPARTMENT_OTHER): Payer: Self-pay

## 2024-09-10 ENCOUNTER — Other Ambulatory Visit (HOSPITAL_BASED_OUTPATIENT_CLINIC_OR_DEPARTMENT_OTHER): Payer: Self-pay

## 2024-09-10 MED ORDER — LORAZEPAM 1 MG PO TABS
1.0000 mg | ORAL_TABLET | Freq: Two times a day (BID) | ORAL | 0 refills | Status: AC | PRN
  Filled 2024-09-10: qty 60, 30d supply, fill #0

## 2024-09-12 ENCOUNTER — Other Ambulatory Visit (HOSPITAL_BASED_OUTPATIENT_CLINIC_OR_DEPARTMENT_OTHER): Payer: Self-pay

## 2024-09-12 DIAGNOSIS — F4323 Adjustment disorder with mixed anxiety and depressed mood: Secondary | ICD-10-CM | POA: Diagnosis not present

## 2024-09-13 ENCOUNTER — Other Ambulatory Visit: Payer: Self-pay

## 2024-09-13 ENCOUNTER — Other Ambulatory Visit (HOSPITAL_BASED_OUTPATIENT_CLINIC_OR_DEPARTMENT_OTHER): Payer: Self-pay

## 2024-09-14 ENCOUNTER — Ambulatory Visit (INDEPENDENT_AMBULATORY_CARE_PROVIDER_SITE_OTHER): Admitting: Family Medicine

## 2024-09-15 ENCOUNTER — Other Ambulatory Visit (HOSPITAL_BASED_OUTPATIENT_CLINIC_OR_DEPARTMENT_OTHER): Payer: Self-pay

## 2024-09-16 ENCOUNTER — Other Ambulatory Visit (HOSPITAL_BASED_OUTPATIENT_CLINIC_OR_DEPARTMENT_OTHER): Payer: Self-pay

## 2024-09-16 MED ORDER — LORAZEPAM 1 MG PO TABS
1.0000 mg | ORAL_TABLET | Freq: Two times a day (BID) | ORAL | 0 refills | Status: AC | PRN
  Filled 2024-10-12: qty 60, 30d supply, fill #0

## 2024-09-19 NOTE — Progress Notes (Unsigned)
 Christine Console, PA-C [address] [place], [place]  [address] Phone: [phone]   Gastroenterology Consultation  Referring Provider:     Daryl Setter, NP Primary Care Physician:  Christine Setter, NP Primary Gastroenterologist:  Christine Console, PA-C / Christine Naval, MD  Reason for Consultation:     GERD, constipation, abdominal pain        HPI:   Discussed the use of AI scribe software for clinical note transcription with the patient, who gave verbal consent to proceed.  New patient, here to evaluate GERD, constipation, and abdominal pain.  Patient request Dr. Naval.  Medical history significant for perforated chronic gastric ulcer, intussusception of the intestine, GERD, IBS with diarrhea and constipation, bariatric surgery (Roux-en-Y gastric bypass), hypertension, ADHD, iron  deficiency anemia, anxiety, depression, laparoscopic bypass 2005 at [facility] complicated by 2 perforations.  Laparoscopic revision of gastric bypass January 2013 after prior marginal ulcer perforations.  Prior cholecystectomy and ventral hernia repair in the remote past.   05/2022 patient underwent diagnostic robotic exploratory laparoscopy with possible small bowel resection for intussusception in [place] through [facility].  She had robotic revision of Roux-en-Y gastric bypass.  History of Present Illness She is having worsening acid reflux and gastrointestinal symptoms. Previous patient of GI Dr. Marvis through [facility].  Before that she saw [facility] in [place].  Transferring care to our office.  Her acid reflux has worsened over the past week, with severe heartburn, especially in the afternoons, and nausea. Eating even small bites of food causes burning pain, which is somewhat relieved by chewing Tums. She experiences nocturnal symptoms, waking up choking on acid, leading to coughing and discomfort. Episodes of gagging and violent vomiting, often  on an empty stomach, result in dry heaving and significant discomfort.  Regarding bowel habits, she has a history of diarrhea-predominant IBS, which improved after the intussusception surgery. However, in recent months, she has experienced alternating constipation and diarrhea. She describes periods of not having bowel movements for two to three days, followed by episodes of diarrhea. This pattern is associated with mid-abdominal pain when constipated and cramping pain when experiencing diarrhea.  She is currently taking Nexium  40 mg once daily in the morning and Pepcid  40 mg twice daily for her acid reflux. This is not controlling her reflux.  She has previously tried other proton pump inhibitors, including Prilosec and pantoprazole , and recalls being on Prevacid. Dexilant was effective in the past but was discontinued due to insurance coverage issues.  05/2022 patient underwent diagnostic robotic exploratory laparoscopy with possible small bowel resection for intussusception in [place] through [facility].  She had robotic revision of Roux-en-Y gastric bypass.  She is a Horticulturist, commercial.  Currently working for [employer].  04/2022 EGD by Dr. Marvis: Normal esophagus.  Previous gastric bypass surgery.  Anastomosis widely patent and a few staples were seen.  No ulcers were found.  Jejunum appeared normal.  Biopsies negative for celiac and H. pylori.  04/2022 colonoscopy by Dr. Marvis in [facility]: Was normal with no polyps.  Small internal hemorrhoids.  Mild diverticulosis.  10-year repeat.  Biopsies negative for microscopic colitis.  EGD 10/03/19 IMPRESSIONS and PLAN:  Reflux esophagitis, grade A  Previous Roux-en-Y gastric bypass surgery  Normal anastomosis  Normal jejunum   Continue on Nexium  (open capsules when taking due to bypass surgery)  Continue on famotidine    Past Medical History:  Diagnosis Date   ADHD (attention deficit hyperactivity  disorder)    Anemia     Anxiety with depression    Autism    Level 1   B12 deficiency    Back pain    Chronic fatigue    Depression with anxiety    Edema of both lower extremities    Excessive sleepiness    GAD (generalized anxiety disorder)    Gallbladder problem    GERD (gastroesophageal reflux disease)    Headache(784.0)    High blood pressure    History of gastric ulcer    partial ulcer perferation gastrojujunal anastomosis  post gastric bypass  -- s/p  surgical repair 2012 and 2013   History of palpitations in adulthood    per pt had occasional palpitations ,  told had PVCs   History of seizures as a child    febrile   History of stomach ulcers    IBS (irritable bowel syndrome)    IDA (iron  deficiency anemia)    Infertility, female    Insomnia    Insomnia    Irritable bowel syndrome with diarrhea    MDD (major depressive disorder)    Migraines    OSA (obstructive sleep apnea)    history  used cpap 2013-2014 now off.   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/  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;   anastomtic ulcer s/p bypass s/p repair x2 2012 and 2013;   Sleep apnea    SOBOE (shortness of breath on exertion)    SOBOE (shortness of breath on exertion)    SUI (stress urinary incontinence, female)    Wears glasses     Past Surgical History:  Procedure Laterality Date   BLADDER SUSPENSION N/A 12/09/2023   Procedure: TRANSVAGINAL TAPE (TVT) PROCEDURE;  Surgeon: Christine Rosaline SAILOR, MD;  Location: [facility];  Service: Gynecology;  Laterality: N/A;   COLONOSCOPY WITH PROPOFOL   2023   CYSTOSCOPY N/A 12/09/2023   Procedure: CYSTOSCOPY;  Surgeon: Christine Rosaline SAILOR, MD;  Location: [facility];  Service: Gynecology;  Laterality: N/A;   DILITATION & CURRETTAGE/HYSTROSCOPY WITH ESSURE  2015   bilateral tubal sterilization with essure   ESOPHAGOGASTRODUODENOSCOPY   11/28/2011   Procedure: ESOPHAGOGASTRODUODENOSCOPY (EGD);  Surgeon: Christine JONETTA Just, MD;  Location: [facility];  Service: Endoscopy;  Laterality: N/A;   LAPAROSCOPIC CHOLECYSTECTOMY  2006   LAPAROSCOPIC GASTROTOMY W/ REPAIR OF ULCER  10/23/2009   @[facility] ;   by dr s. gross;  EXTENSIVE LYSIS ADHESIONS/  OMENTAL PATCH OF ULCER   LAPAROSCOPY ABDOMEN DIAGNOSTIC  02/21/2011   @[facility]  by dr forbes. wilson/ dr b. sebastian;  upper endoscopy/  LAPAROSCOPIC OMENTAL PATCH REPAIR GASTROJEJUNAL ANASTOMTIC PERFORATION / PLACEMENT DRAIN   ROBOTIC ASSISTED LAPAROSCOPIC OVARIAN CYSTECTOMY Right 12/09/2023   Procedure: XI ROBOTIC ASSISTED LAPAROSCOPIC OVARIAN CYSTECTOMY;  Surgeon: Christine Rosaline SAILOR, MD;  Location: [facility];  Service: Gynecology;  Laterality: Right;   ROUX-EN-Y GASTRIC BYPASS  2005   @ [facility] by dr sherlean sous;  laparoscopic   VENTRAL HERNIA REPAIR  12/22/2011   Procedure: HERNIA REPAIR VENTRAL ADULT;  Surgeon: Donnice KATHEE Lunger, MD;  Location: [facility];  Service: General;;  RESECTION/ RE-DO  OF PRIOR GASTROJEJUNOSTOMY (GASTRIC BYPASS REVISION)   XI ROBOTIC ASSISTED SMALL BOWEL RESECTION  06/05/2022   @ [facility] by dr shaunna. chandler;  for intussusception intestine   XI ROBOTIC ASSISTED TOTAL HYSTERECTOMY  WITH SACROCOLPOPEXY N/A 12/09/2023   Procedure: XI ROBOTIC ASSISTED TOTAL HYSTERECTOMY WITH BILATERAL SALPINGECTOMY AND SACROCOLPOPEXY;  Surgeon: Christine Rosaline SAILOR, MD;  Location: [facility];  Service: Gynecology;  Laterality: N/A;    Prior to Admission medications   Medication Sig Start Date End Date Taking? Authorizing Provider  acetaminophen  (TYLENOL ) 500 MG tablet Take 500 mg by mouth every 6 (six) hours as needed.    [provider]  amphetamine -dextroamphetamine  (ADDERALL) 20 MG tablet Take one tablet twice daily 03/17/24     amphetamine -dextroamphetamine  (ADDERALL) 20 MG tablet Take 1 tablet (20 mg total) by mouth 2 (two) times daily 06/29/24      amphetamine -dextroamphetamine  (ADDERALL) 20 MG tablet Take 1 tablet (20 mg total) by mouth 2 (two) times daily. 08/26/24     amphetamine -dextroamphetamine  (ADDERALL) 20 MG tablet Take 1 tablet (20 mg total) by mouth 2 (two) times daily 09/25/24     busPIRone  (BUSPAR ) 30 MG tablet Take 1 tablet (30 mg total) by mouth 2 (two) times daily with food. 03/17/24     busPIRone  (BUSPAR ) 30 MG tablet Take 1 tablet (30 mg total) by mouth 2 (two) times daily with food. 06/28/24     Calcium Carb-Cholecalciferol (CHEWABLE CALCIUM/D3 PO) Take by mouth 2 (two) times daily. One chew twice daily 08/01/22   [provider]  cyanocobalamin  500 MCG tablet Take 500 mcg by mouth daily.    [provider]  cyclobenzaprine  (FLEXERIL ) 5 MG tablet Take 5 mg by mouth 3 (three) times daily as needed for muscle spasms.    [provider]  DULoxetine  (CYMBALTA ) 30 MG capsule Take 3 capsules (90 mg total) by mouth daily. 12/11/23     DULoxetine  (CYMBALTA ) 30 MG capsule Take 3 capsules (90 mg total) by mouth daily. 06/28/24     DULoxetine  (CYMBALTA ) 30 MG capsule Take 3 capsules (90 mg total) by mouth daily. 08/26/24     esomeprazole  (NEXIUM ) 40 MG capsule Take 1 capsule (40 mg total) by mouth daily. 03/04/24   O'Sullivan, Melissa, NP  estradiol  (CLIMARA  - DOSED IN MG/24 HR) 0.1 mg/24hr patch Place 1 patch (0.1 mg total) onto the skin once a week. 06/15/24   Anyanwu, Ugonna A, MD  famotidine  (PEPCID ) 40 MG tablet Take 0.5 tablets (20 mg total) by mouth 2 (two) times daily. 05/03/24   O'Sullivan, Melissa, NP  Iron -FA-B Cmp-C-Biot-Probiotic (FUSION PLUS) CAPS Take 1 capsule by mouth daily. 08/22/12   [provider]  LORazepam  (ATIVAN ) 1 MG tablet Take 1 tablet (1 mg total) by mouth every 12 (twelve) hours as needed for severe anxiety. 06/28/24     LORazepam  (ATIVAN ) 1 MG tablet Take 1 tablet (1 mg total) by mouth every 12 (twelve) hours as needed, for severe anxiety. 09/09/24     LORazepam  (ATIVAN ) 1 MG tablet  Take 1 tablet (1 mg total) by mouth every 12 (twelve) hours as needed for severe anxiety. 10/09/24     Multiple Vitamins-Minerals (MULTIVITAMIN WITH MINERALS) tablet Take 1 tablet by mouth daily. 02/19/24   Christine Setter, NP  nystatin  powder Apply 1 Application topically 2 (two) times daily as needed. 02/17/24   O'Sullivan, Melissa, NP  pramipexole  (MIRAPEX ) 1.5 MG tablet Take 1 tablet (1.5 mg total) by mouth at bedtime. 09/30/23     pramipexole  (MIRAPEX ) 1.5 MG tablet Take one tablet at bedtime 03/17/24     pramipexole  (MIRAPEX ) 1.5 MG tablet Take 1 tablet (1.5 mg total) by mouth at bedtime. 06/28/24     pramipexole  (MIRAPEX )  1.5 MG tablet Take 1 tablet (1.5 mg total) by mouth at bedtime. 07/25/24     pramipexole  (MIRAPEX ) 1.5 MG tablet Take 1 tablet (1.5 mg total) by mouth at bedtime. 08/26/24     Rimegepant Sulfate  (NURTEC) 75 MG TBDP Take 1 tablet by mouth once daily as needed for migraine 07/27/24   Ines Onetha NOVAK, MD  traZODone  (DESYREL ) 100 MG tablet Take 1-2 tablets (100-200 mg total) by mouth at bedtime as needed for insomnia. 12/11/23     traZODone  (DESYREL ) 100 MG tablet Take 1-2 tablets (100-200 mg total) by mouth at bedtime as needed for insomnia. 03/17/24     traZODone  (DESYREL ) 100 MG tablet Take 1-2 tablets (100-200 mg total) by mouth at bedtime as needed for insomnia. 07/25/24     traZODone  (DESYREL ) 100 MG tablet Take 1-2 tablets (100-200 mg total) by mouth at bedtime as needed for insomnia. 08/26/24     Vitamin D , Ergocalciferol , (DRISDOL ) 1.25 MG (50000 UNIT) CAPS capsule Take 1 capsule (50,000 Units total) by mouth every 7 (seven) days. 08/01/24   Verdon Louann BIRCH, MD    Family History  Problem Relation Age of Onset   Stroke Mother    Hypertension Mother    CVA Mother    Heart disease Mother        fibroelastoma on her heart, CAD, CABG   Cerebral aneurysm Mother    Sleep apnea Mother    Obesity Mother    Cancer Father    COPD Father    Liver cancer Father    Alcohol abuse  Father    Alcoholism Father    Drug abuse Father    Drug abuse Brother    Alcohol abuse Brother    Drug abuse Brother    Alcohol abuse Brother    Heart disease Maternal Grandmother    Aneurysm Maternal Grandfather    Diabetes Paternal Grandmother    Drug abuse Son        in recovery   Asthma Son    Depression Son    ADD / ADHD Son    Anxiety disorder Son    Tourette syndrome Son    Anesthesia problems Neg Hx    Hypotension Neg Hx    Malignant hyperthermia Neg Hx    Pseudochol deficiency Neg Hx    Migraines Neg Hx      Social History   Tobacco Use   Smoking status: Never    Passive exposure: Past   Smokeless tobacco: Never  Vaping Use   Vaping status: Never Used  Substance Use Topics   Alcohol use: No   Drug use: Never    Allergies as of 09/20/2024 - Review Complete 09/20/2024  Allergen Reaction Noted   Nsaids Other (See Comments) 03/30/2014    Review of Systems:    All systems reviewed and negative except where noted in HPI.   Physical Exam:  BP 100/72   Pulse 98   Ht 5' 7 (1.702 m)   Wt 238 lb (108 kg)   BMI 37.28 kg/m  No LMP recorded. Patient is perimenopausal.  General:   Alert,  Well-developed, well-nourished, obese, pleasant and cooperative in NAD Lungs:  Respirations even and unlabored.  Clear throughout to auscultation.   No wheezes, crackles, or rhonchi. No acute distress. Heart:  Regular rate and rhythm; no murmurs, clicks, rubs, or gallops. Abdomen:  Normal bowel sounds.  No bruits.  Soft, and non-distended without masses, hepatosplenomegaly or hernias noted.  Mild epigastric and periumbilical tenderness.  Mild bilateral lower abdominal tenderness.  No guarding or rebound tenderness.    Neurologic:  Alert and oriented x3;  grossly normal neurologically. Psych:  Alert and cooperative. Normal mood and affect.   Imaging Studies: No results found.  Labs: CBC    Component Value Date/Time   WBC 6.6 06/02/2024 1006   WBC 6.9 02/17/2024 1015    RBC 4.50 06/02/2024 1006   RBC 4.32 02/17/2024 1015   HGB 13.7 06/02/2024 1006   HCT 42.8 06/02/2024 1006   PLT 281 06/02/2024 1006   MCV 95 06/02/2024 1006    CMP     Component Value Date/Time   NA 136 06/02/2024 1006   K 4.4 06/02/2024 1006   CL 100 06/02/2024 1006   CO2 19 (L) 06/02/2024 1006   GLUCOSE 83 06/02/2024 1006   GLUCOSE 78 12/19/2011 0945   BUN 10 06/02/2024 1006   CREATININE 0.57 06/02/2024 1006   CALCIUM 9.0 06/02/2024 1006   PROT 6.3 06/02/2024 1006   ALBUMIN  4.0 06/02/2024 1006   AST 18 06/02/2024 1006   ALT 16 06/02/2024 1006   ALKPHOS 103 06/02/2024 1006   BILITOT 0.5 06/02/2024 1006   GFRNONAA >90 12/22/2011 1415   GFRAA >90 12/22/2011 1415    Assessment and Plan:   [name] is a 48 y.o. y/o female has been referred for:  Chronic worsening GERD: Not controlled Nexium  40 once daily and famotidine  40 twice daily.  She has been on multiple different PPIs for many years. - Stop Nexium . - Start Dexilant 60 mg once daily before bedtime.  - Continue famotidine  40 mg once daily in the morning. - Recommend Lifestyle Modifications to prevent Acid Reflux.  Rec. Avoid coffee, sodas, peppermint, garlic, onions, alcohol, citrus fruits, chocolate, tomatoes, fatty and spicey foods.  Avoid eating 2-3 hours before bedtime.   - Consider alternative PPIs or PCABs like Voquezna if Dexilant not approved or ineffective.  Irritable bowel syndrome with alternating constipation and diarrhea - Rx hyoscyamine 0.125 mg 3 times daily as needed, #90, 5 RF. - Start Metamucil 1 packet daily.  3.  Post-surgical bowel habit changes.  She has had multiple abdominal surgeries including gastric bypass with revision.  Likely has adhesions from scar tissue contributing to chronic abdominal pain.   Follow up 3 months with Dr. Leigh or TG.  Christine Console, PA-C

## 2024-09-20 ENCOUNTER — Other Ambulatory Visit (HOSPITAL_BASED_OUTPATIENT_CLINIC_OR_DEPARTMENT_OTHER): Payer: Self-pay

## 2024-09-20 ENCOUNTER — Other Ambulatory Visit: Payer: Self-pay

## 2024-09-20 ENCOUNTER — Ambulatory Visit (INDEPENDENT_AMBULATORY_CARE_PROVIDER_SITE_OTHER): Admitting: Physician Assistant

## 2024-09-20 ENCOUNTER — Encounter: Payer: Self-pay | Admitting: Physician Assistant

## 2024-09-20 VITALS — BP 100/72 | HR 98 | Ht 67.0 in | Wt 238.0 lb

## 2024-09-20 DIAGNOSIS — K582 Mixed irritable bowel syndrome: Secondary | ICD-10-CM

## 2024-09-20 DIAGNOSIS — K219 Gastro-esophageal reflux disease without esophagitis: Secondary | ICD-10-CM | POA: Diagnosis not present

## 2024-09-20 MED ORDER — DEXLANSOPRAZOLE 60 MG PO CPDR
60.0000 mg | DELAYED_RELEASE_CAPSULE | Freq: Every day | ORAL | 3 refills | Status: DC
  Filled 2024-09-20 – 2024-10-12 (×5): qty 90, 90d supply, fill #0

## 2024-09-20 MED ORDER — HYOSCYAMINE SULFATE 0.125 MG PO TABS
0.1250 mg | ORAL_TABLET | ORAL | 5 refills | Status: AC | PRN
  Filled 2024-09-20: qty 90, 15d supply, fill #0

## 2024-09-20 MED ORDER — FAMOTIDINE 40 MG PO TABS
40.0000 mg | ORAL_TABLET | Freq: Every day | ORAL | 3 refills | Status: AC
  Filled 2024-09-20 – 2024-10-29 (×3): qty 90, 90d supply, fill #0

## 2024-09-20 NOTE — Patient Instructions (Addendum)
   Start Metamucil (Psyllium Husk) Fiber Powder: Metamucil powders: Put 1-2 rounded spoons in an empty glass. If you're taking Metamucil Sugar-Free Powder or Premium Blend, use a teaspoon. If you're taking Metamucil with Real Sugar, use a tablespoon. Mix briskly with 8 oz. or more of cool liquid. Drink promptly, and enjoy! Drink 64 ounces of water  or other liquids daily.   RX Hyoscimaine 0.125mg  1 tablet 3 times daily as needed for abdominal cramping and colon spasm.  Recommend Lifestyle Modifications to prevent Acid Reflux.  Rec. Avoid coffee, sodas, peppermint, garlic, onions, alcohol, citrus fruits, chocolate, tomatoes, fatty and spicey foods.  Avoid eating 2-3 hours before bedtime.   - Stop Nexium . - Start Dexilant 60 mg once daily before bedtime - Continue famotidine  40 mg once daily in the morning  Please follow up sooner if symptoms increase or worsen  Due to recent changes in healthcare laws, you may see the results of your imaging and laboratory studies on MyChart before your provider has had a chance to review them.  We understand that in some cases there may be results that are confusing or concerning to you. Not all laboratory results come back in the same time frame and the provider may be waiting for multiple results in order to interpret others.  Please give us  48 hours in order for your provider to thoroughly review all the results before contacting the office for clarification of your results.   Thank you for trusting me with your gastrointestinal care!   [name], PA-C _______________________________________________________  If your blood pressure at your visit was 140/90 or greater, please contact your primary care physician to follow up on this.  _______________________________________________________  If you are age 60 or older, your body mass index should be between 23-30. Your Body mass index is 37.28 kg/m. If this is out of the aforementioned range listed,  please consider follow up with your Primary Care Provider.  If you are age 35 or younger, your body mass index should be between 19-25. Your Body mass index is 37.28 kg/m. If this is out of the aformentioned range listed, please consider follow up with your Primary Care Provider.   ________________________________________________________  The [facility] providers would like to encourage you to use MYCHART to communicate with providers for non-urgent requests or questions.  Due to long hold times on the telephone, sending your provider a message by [facility] may be a faster and more efficient way to get a response.  Please allow 48 business hours for a response.  Please remember that this is for non-urgent requests.  _______________________________________________________

## 2024-09-21 ENCOUNTER — Other Ambulatory Visit (HOSPITAL_BASED_OUTPATIENT_CLINIC_OR_DEPARTMENT_OTHER): Payer: Self-pay

## 2024-09-21 ENCOUNTER — Other Ambulatory Visit: Payer: Self-pay

## 2024-09-21 NOTE — Progress Notes (Signed)
 Agree with assessment and plan with the following suggestions: - With significant symptoms now that she is post revision of her gastric bypass, EGD may be reasonable here.  Up to the patient if she wants to see how medical therapy does but she has failed numerous options, I think reasonable to proceed with EGD if she is willing -I would recommend she crack open Dexilant capsule and swallow contents with applesauce or water  etc.  She needs to crack open the capsule of taking PPI given her gastric bypass, to allow absorption.  Could also consider switch to Voquenza and if this does not help.  Thanks

## 2024-09-21 NOTE — Progress Notes (Signed)
 Pt aware of recommendations. Awaiting further communication from pt as to if she would like to proceed with scheduling EGD.

## 2024-09-22 ENCOUNTER — Encounter: Payer: Self-pay | Admitting: Obstetrics & Gynecology

## 2024-09-23 ENCOUNTER — Other Ambulatory Visit (HOSPITAL_BASED_OUTPATIENT_CLINIC_OR_DEPARTMENT_OTHER): Payer: Self-pay

## 2024-09-23 MED ORDER — ESTRADIOL 0.1 MG/24HR TD PTTW
1.0000 | MEDICATED_PATCH | TRANSDERMAL | 12 refills | Status: DC
  Filled 2024-09-23: qty 8, 28d supply, fill #0
  Filled 2024-10-29: qty 8, 28d supply, fill #1
  Filled 2024-11-30: qty 8, 28d supply, fill #2

## 2024-09-26 ENCOUNTER — Other Ambulatory Visit (HOSPITAL_BASED_OUTPATIENT_CLINIC_OR_DEPARTMENT_OTHER): Payer: Self-pay

## 2024-09-26 ENCOUNTER — Encounter (INDEPENDENT_AMBULATORY_CARE_PROVIDER_SITE_OTHER): Payer: Self-pay | Admitting: Family Medicine

## 2024-09-26 ENCOUNTER — Ambulatory Visit (INDEPENDENT_AMBULATORY_CARE_PROVIDER_SITE_OTHER): Payer: Self-pay | Admitting: Family Medicine

## 2024-09-26 ENCOUNTER — Other Ambulatory Visit: Payer: Self-pay

## 2024-09-26 ENCOUNTER — Telehealth: Payer: Self-pay | Admitting: Physician Assistant

## 2024-09-26 ENCOUNTER — Encounter: Payer: Self-pay | Admitting: Radiology

## 2024-09-26 VITALS — BP 117/80 | HR 89 | Temp 98.0°F | Ht 67.0 in | Wt 236.0 lb

## 2024-09-26 DIAGNOSIS — E559 Vitamin D deficiency, unspecified: Secondary | ICD-10-CM | POA: Diagnosis not present

## 2024-09-26 DIAGNOSIS — E669 Obesity, unspecified: Secondary | ICD-10-CM | POA: Diagnosis not present

## 2024-09-26 DIAGNOSIS — Z6836 Body mass index (BMI) 36.0-36.9, adult: Secondary | ICD-10-CM

## 2024-09-26 DIAGNOSIS — K219 Gastro-esophageal reflux disease without esophagitis: Secondary | ICD-10-CM

## 2024-09-26 DIAGNOSIS — F4323 Adjustment disorder with mixed anxiety and depressed mood: Secondary | ICD-10-CM | POA: Diagnosis not present

## 2024-09-26 MED ORDER — VOQUEZNA 20 MG PO TABS: 20.0000 mg | ORAL_TABLET | Freq: Every day | ORAL | 1 refills | Status: DC

## 2024-09-26 NOTE — Telephone Encounter (Signed)
 Change PPI medicine.

## 2024-09-26 NOTE — Progress Notes (Signed)
 bmi
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
## 2024-09-26 NOTE — Addendum Note (Signed)
 Addended by: LANETTE ALETHEA CROME on: 09/26/2024 04:53 PM   Modules accepted: Orders

## 2024-09-27 ENCOUNTER — Other Ambulatory Visit (HOSPITAL_COMMUNITY): Payer: Self-pay

## 2024-09-27 ENCOUNTER — Telehealth: Payer: Self-pay

## 2024-09-27 NOTE — Telephone Encounter (Signed)
 Pharmacy Patient Advocate Encounter   Received notification from CoverMyMeds that prior authorization for Voquezna 20MG  tablets is required/requested.   Insurance verification completed.   The patient is insured through [facility].   Per test claim: PA required; PA started via CoverMyMeds. KEY [id number] . Waiting for clinical questions to populate.

## 2024-09-28 ENCOUNTER — Ambulatory Visit: Admitting: Podiatry

## 2024-09-28 NOTE — Telephone Encounter (Signed)
 Pharmacy Patient Advocate Encounter    Per test claim: PA required; PA submitted to above mentioned insurance via Latent Key/confirmation #/EOC [id number] Status is pending

## 2024-09-29 ENCOUNTER — Other Ambulatory Visit (HOSPITAL_BASED_OUTPATIENT_CLINIC_OR_DEPARTMENT_OTHER): Payer: Self-pay

## 2024-09-29 DIAGNOSIS — F3341 Major depressive disorder, recurrent, in partial remission: Secondary | ICD-10-CM | POA: Diagnosis not present

## 2024-09-29 DIAGNOSIS — F900 Attention-deficit hyperactivity disorder, predominantly inattentive type: Secondary | ICD-10-CM | POA: Diagnosis not present

## 2024-09-29 DIAGNOSIS — F411 Generalized anxiety disorder: Secondary | ICD-10-CM | POA: Diagnosis not present

## 2024-09-29 MED ORDER — DULOXETINE HCL 30 MG PO CPEP
90.0000 mg | ORAL_CAPSULE | Freq: Every day | ORAL | 2 refills | Status: AC
  Filled 2024-09-29: qty 90, 30d supply, fill #0
  Filled 2024-10-29: qty 270, 90d supply, fill #0

## 2024-09-29 MED ORDER — PRAMIPEXOLE DIHYDROCHLORIDE 1.5 MG PO TABS
1.5000 mg | ORAL_TABLET | Freq: Every day | ORAL | 2 refills | Status: AC
  Filled 2024-10-29: qty 90, 90d supply, fill #0
  Filled 2024-11-11: qty 30, 30d supply, fill #0

## 2024-09-29 MED ORDER — LORAZEPAM 1 MG PO TABS
1.0000 mg | ORAL_TABLET | Freq: Two times a day (BID) | ORAL | 0 refills | Status: AC | PRN
  Filled 2024-11-11: qty 60, 30d supply, fill #0

## 2024-09-29 MED ORDER — AMPHETAMINE-DEXTROAMPHETAMINE 20 MG PO TABS
20.0000 mg | ORAL_TABLET | Freq: Two times a day (BID) | ORAL | 0 refills | Status: AC
  Filled 2024-11-30: qty 60, 30d supply, fill #0

## 2024-09-29 MED ORDER — AMPHETAMINE-DEXTROAMPHETAMINE 20 MG PO TABS
20.0000 mg | ORAL_TABLET | Freq: Two times a day (BID) | ORAL | 0 refills | Status: AC
  Filled 2024-10-30: qty 60, 30d supply, fill #0

## 2024-09-29 MED ORDER — LORAZEPAM 1 MG PO TABS
1.0000 mg | ORAL_TABLET | Freq: Two times a day (BID) | ORAL | 0 refills | Status: AC | PRN
  Filled 2024-12-10 – 2024-12-14 (×2): qty 60, 30d supply, fill #0

## 2024-09-29 MED ORDER — TRAZODONE HCL 100 MG PO TABS
100.0000 mg | ORAL_TABLET | Freq: Every evening | ORAL | 2 refills | Status: AC | PRN
  Filled 2024-09-29: qty 60, 30d supply, fill #0
  Filled 2024-10-29: qty 180, 90d supply, fill #0

## 2024-09-30 NOTE — Telephone Encounter (Signed)
 Pharmacy Patient Advocate Encounter  Received notification from [facility] [place] that Prior Authorization for  Voquezna 20MG  tablets has been DENIED.  Full denial letter will be uploaded to the media tab. See denial reason below.  Our guideline named VONOPRAZAN requires that the following rule(s) be met for approval:  1) The request is for Voquezna 10MG  tablets 2) Your diagnosis has been confirmed by endoscopy (a procedure used to look inside your body) AND you d onto have the presence of visible erosion (damage to the tissue lining)  PA #/Case ID/Reference #: [id number]

## 2024-10-04 NOTE — Telephone Encounter (Signed)
 Called pt and left detailed message on pts voicemail regarding Tina's recommendations below.

## 2024-10-05 ENCOUNTER — Ambulatory Visit: Admitting: Podiatry

## 2024-10-07 ENCOUNTER — Ambulatory Visit: Admitting: Podiatry

## 2024-10-07 ENCOUNTER — Encounter: Payer: Self-pay | Admitting: Podiatry

## 2024-10-07 VITALS — Ht 67.0 in | Wt 236.0 lb

## 2024-10-07 DIAGNOSIS — M722 Plantar fascial fibromatosis: Secondary | ICD-10-CM

## 2024-10-07 MED ORDER — TRIAMCINOLONE ACETONIDE 10 MG/ML IJ SUSP
10.0000 mg | Freq: Once | INTRAMUSCULAR | Status: AC
  Administered 2024-10-07: 10 mg via INTRA_ARTICULAR

## 2024-10-09 ENCOUNTER — Other Ambulatory Visit (HOSPITAL_BASED_OUTPATIENT_CLINIC_OR_DEPARTMENT_OTHER): Payer: Self-pay

## 2024-10-10 ENCOUNTER — Other Ambulatory Visit (HOSPITAL_BASED_OUTPATIENT_CLINIC_OR_DEPARTMENT_OTHER): Payer: Self-pay

## 2024-10-10 ENCOUNTER — Other Ambulatory Visit: Payer: Self-pay | Admitting: Physician Assistant

## 2024-10-10 DIAGNOSIS — K219 Gastro-esophageal reflux disease without esophagitis: Secondary | ICD-10-CM

## 2024-10-10 DIAGNOSIS — F4323 Adjustment disorder with mixed anxiety and depressed mood: Secondary | ICD-10-CM | POA: Diagnosis not present

## 2024-10-10 NOTE — Progress Notes (Signed)
 Subjective:   Patient ID: [name], female   DOB: 48 y.o.   MRN: [id number]   HPI Patient presents stating that her left foot and heel is killing her the right is doing great and she knows she is gena need surgery but she needs to wait till next year and is looking for any form of short-term relief   ROS      Objective:  Physical Exam  Neuro vascular status intact with patient found to have exquisite discomfort medial and central band of the plantar fascia left at insertion and the right is doing very well from previous surgery     Assessment:  Acute plantar fasciitis left with inflammation     Plan:  H&P reviewed sterile prep injected the insertion of the tendon 3 mg Kenalog  5 mg Xylocaine  applied sterile dressing reappoint to recheck

## 2024-10-11 ENCOUNTER — Other Ambulatory Visit (HOSPITAL_BASED_OUTPATIENT_CLINIC_OR_DEPARTMENT_OTHER): Payer: Self-pay

## 2024-10-11 MED ORDER — VOQUEZNA 10 MG PO TABS
10.0000 mg | ORAL_TABLET | Freq: Every day | ORAL | 2 refills | Status: DC
  Filled 2024-10-11: qty 30, 30d supply, fill #0

## 2024-10-12 ENCOUNTER — Other Ambulatory Visit: Payer: Self-pay

## 2024-10-12 ENCOUNTER — Telehealth: Payer: Self-pay | Admitting: Physician Assistant

## 2024-10-12 ENCOUNTER — Other Ambulatory Visit (HOSPITAL_BASED_OUTPATIENT_CLINIC_OR_DEPARTMENT_OTHER): Payer: Self-pay

## 2024-10-12 ENCOUNTER — Ambulatory Visit (AMBULATORY_SURGERY_CENTER)

## 2024-10-12 VITALS — Ht 67.0 in | Wt 232.0 lb

## 2024-10-12 DIAGNOSIS — K219 Gastro-esophageal reflux disease without esophagitis: Secondary | ICD-10-CM

## 2024-10-12 DIAGNOSIS — K2100 Gastro-esophageal reflux disease with esophagitis, without bleeding: Secondary | ICD-10-CM

## 2024-10-12 MED ORDER — LANSOPRAZOLE 30 MG PO CPDR
30.0000 mg | DELAYED_RELEASE_CAPSULE | Freq: Two times a day (BID) | ORAL | 3 refills | Status: AC
  Filled 2024-10-12: qty 180, 90d supply, fill #0

## 2024-10-12 NOTE — Progress Notes (Signed)
 PCP MD at time of PV: Eleanor Ponto  __________________________________________________________________________________________________________________________________________  No egg allergy known to patient  No soy allergy known to patient No issues known to pt with past sedation with any surgeries or procedures Patient denies ever being told they had issues or difficulty with intubation  No FH of Malignant Hyperthermia Pt is not on diet pills Pt is not on  home 02  Pt is not on blood thinners  No A fib or A flutter Have any cardiac testing pending--no  LOA: independent  No Chew or Snuff tobacco _______________________________________________________________________________________________________________________________  PV completed with patient. Prep instructions reviewed and provided during apt. Rx sent to preferred pharmacy.  __________________________________________________________________________________________________________________________________________  Patient's chart reviewed by Norleen Schillings CNRA prior to previsit and patient appropriate for the [facility].  Previsit completed and red dot placed by patient's name on their procedure day (on provider's schedule).

## 2024-10-12 NOTE — Telephone Encounter (Signed)
 Insurance will not cover Dexilant. Need to switch to lansoprazole 30 mg twice daily. [facility]. [name], PA-C

## 2024-10-13 ENCOUNTER — Other Ambulatory Visit: Payer: Self-pay

## 2024-10-17 ENCOUNTER — Other Ambulatory Visit (HOSPITAL_BASED_OUTPATIENT_CLINIC_OR_DEPARTMENT_OTHER): Payer: Self-pay

## 2024-10-17 ENCOUNTER — Encounter (INDEPENDENT_AMBULATORY_CARE_PROVIDER_SITE_OTHER): Payer: Self-pay | Admitting: Physician Assistant

## 2024-10-17 ENCOUNTER — Ambulatory Visit (INDEPENDENT_AMBULATORY_CARE_PROVIDER_SITE_OTHER): Admitting: Physician Assistant

## 2024-10-17 VITALS — BP 107/76 | HR 110 | Temp 98.5°F | Ht 67.0 in | Wt 233.0 lb

## 2024-10-17 DIAGNOSIS — E559 Vitamin D deficiency, unspecified: Secondary | ICD-10-CM

## 2024-10-17 DIAGNOSIS — Z6836 Body mass index (BMI) 36.0-36.9, adult: Secondary | ICD-10-CM | POA: Diagnosis not present

## 2024-10-17 DIAGNOSIS — Z9884 Bariatric surgery status: Secondary | ICD-10-CM

## 2024-10-17 DIAGNOSIS — M722 Plantar fascial fibromatosis: Secondary | ICD-10-CM

## 2024-10-17 DIAGNOSIS — Z6838 Body mass index (BMI) 38.0-38.9, adult: Secondary | ICD-10-CM

## 2024-10-17 DIAGNOSIS — E66812 Obesity, class 2: Secondary | ICD-10-CM | POA: Diagnosis not present

## 2024-10-17 MED ORDER — VITAMIN D (ERGOCALCIFEROL) 1.25 MG (50000 UNIT) PO CAPS
50000.0000 [IU] | ORAL_CAPSULE | ORAL | 0 refills | Status: DC
  Filled 2024-10-17: qty 4, 28d supply, fill #0

## 2024-10-17 NOTE — Progress Notes (Signed)
 SUBJECTIVE: Discussed the use of AI scribe software for clinical note transcription with the patient, who gave verbal consent to proceed.  Chief Complaint: Obesity  Interim History: She is down 3 lbs since her last visit  Down 11 lbs overall.    [name] is here to discuss her progress with her obesity treatment plan. She is on the keeping a food journal and adhering to recommended goals of 1200-1400 calories and 100 grams of protein and states she is following her eating plan approximately 50 % of the time. She states she is exercising walking 15 minutes 4 times per week.  [name] is a 48 year old female who presents for follow-up on her obesity treatment plan.  She is adhering to a dietary plan of 1300 to 1500 calories, achieving her protein intake goal of 100 grams or more about 50% of the time. She has lost a total of eleven pounds since starting her current plan, with a recent loss of three pounds since her last visit. She consumes more whole foods but struggles with adequate hydration and occasionally skips meals.  She underwent a Roux-en-Y gastric bypass in 2005, which was partially revised in 2013, and had a bowel resection in 2023. She reports she no longer has the volume restriction she initially had following her initial gastric bypass surgery and can eat a moderately sized meal. She is currently taking vitamin D  ergocalciferol  50,000 units once weekly for vitamin D  deficiency.  She reports plantar fasciitis in both feet and has had surgery on the right foot. She uses night splints and icing as part of her management strategy.  She sleeps 7 to 9 hours per night and engages in physical activity by walking for fifteen minutes four times per week.   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 Her daughter is a picky eater, preferring foods like pizza and  chicken nuggets, while her son is more open to trying different foods.  She has ADHD and takes medication for it, which sometimes leads her to forget to eat, resulting in sudden hunger. She finds it difficult to maintain a consistent meal planning routine due to her lifestyle and family responsibilities.  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 Her peak weight before surgery was 320 pounds, and she lost down to 144 pounds over five years.  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 She has lost 11 pounds with a reduction in adipose mass by 2.6 pounds during the past few weeks.  Challenges include meal planning due to a busy schedule and ADHD, leading to occasional skipped meals.  She is on a calorie-restricted diet (1200-1400 calories) with adequate protein intake. Physical activity is limited by bilateral plantar fasciitis, but she walks 15 minutes four times a week. She is considering using long life meals to simplify meal planning and improve adherence to dietary goals. - Continue calorie-restricted diet (1200-1400 calories) with adequate protein intake. - Consider using long life meals to simplify meal planning. -Also discussed [name] prepared meats as option for quick meal planning -  Encouraged continued physical activity as tolerated, considering plantar fasciitis limitations. - Discussed portion control strategies for holidays, such as Thanksgiving.  Vitamin D  deficiency Managed with ergocalciferol  50,000 units once weekly. Reports no SE.  Last vitamin D  Lab Results  Component Value Date   VD25OH 39.1 06/02/2024   Low vitamin D  levels can be associated with adiposity and may result in leptin resistance and weight gain. Also associated with fatigue.  Currently on vitamin D  supplementation without any adverse effects such as nausea, vomiting or muscle weakness.  - Continue/refill ergocalciferol  50,000 units once weekly.   Plantar fasciitis Plantar fasciitis with previous surgery on the right foot. The left foot is currently symptomatic, and she is considering surgery. She uses night splints and icing for symptom management. - Continue using night splints and icing for symptom management. - She is considering surgical intervention for the left foot if symptoms persist. Vitals Temp: 98.5 F (36.9 C) BP: 107/76 Pulse Rate: (!) 110 SpO2: 97 %   Anthropometric Measurements Height: 5' 7 (1.702 m) Weight: 233 lb (105.7 kg) BMI (Calculated): 36.48 Weight at Last Visit: 236 lb Weight Lost Since Last Visit: 3 lb Weight Gained Since Last Visit: 0 Starting Weight: 244 lb Total Weight Loss (lbs): 11 lb (4.99 kg) Peak Weight: 320 lb   Body Composition  Body Fat %: 47.6 % Fat Mass (lbs): 111.2 lbs Muscle Mass (lbs): 116.4 lbs Total Body Water  (lbs): 86.2 lbs Visceral Fat Rating : 13   Other Clinical Data Fasting: No Labs: No Today's Visit #: 7 Starting Date: 06/02/24     ASSESSMENT AND PLAN:  Diet: [name] is currently in the action stage of change. As such, her goal is to continue with weight loss efforts. She has agreed to keeping a food journal and adhering to recommended goals of 1200-1400 calories and 100 grams of protein.  Exercise: [name] has  been instructed to work up to a goal of 150 minutes of combined cardio and strengthening exercise per week, to try a geriatric exercise plan, and that some exercise is better than none for weight loss and overall health benefits.   Behavior Modification:  We discussed the following Behavioral Modification Strategies today: increasing lean protein intake, decreasing simple carbohydrates, increasing vegetables, increase H2O intake, increase high fiber foods, no skipping meals, meal planning and cooking strategies, holiday eating strategies, avoiding temptations, planning for success, and keep a strict food journal. We discussed various medication options to help [name] with her weight loss efforts and we both agreed to continue to work on nutritional and behavioral strategies to promote weight loss.  .  Return in about 2 weeks (around 10/31/2024).SABRA She was informed of the importance of frequent follow up visits to maximize her success with intensive lifestyle modifications for her multiple health conditions.  Attestation Statements:   Reviewed by clinician on day of visit: allergies, medications, problem list, medical history, surgical history, family history, social history, and previous encounter notes.   Time spent on visit including pre-visit chart review and post-visit care and charting  was 34 minutes.    Lash Matulich, PA-C

## 2024-10-18 ENCOUNTER — Other Ambulatory Visit (HOSPITAL_BASED_OUTPATIENT_CLINIC_OR_DEPARTMENT_OTHER): Payer: Self-pay

## 2024-10-21 ENCOUNTER — Other Ambulatory Visit (HOSPITAL_BASED_OUTPATIENT_CLINIC_OR_DEPARTMENT_OTHER): Payer: Self-pay

## 2024-10-24 DIAGNOSIS — F4323 Adjustment disorder with mixed anxiety and depressed mood: Secondary | ICD-10-CM | POA: Diagnosis not present

## 2024-10-28 ENCOUNTER — Other Ambulatory Visit (HOSPITAL_COMMUNITY): Payer: Self-pay

## 2024-10-29 ENCOUNTER — Other Ambulatory Visit (HOSPITAL_COMMUNITY): Payer: Self-pay

## 2024-10-31 ENCOUNTER — Encounter (INDEPENDENT_AMBULATORY_CARE_PROVIDER_SITE_OTHER): Payer: Self-pay | Admitting: Physician Assistant

## 2024-10-31 ENCOUNTER — Ambulatory Visit (INDEPENDENT_AMBULATORY_CARE_PROVIDER_SITE_OTHER): Admitting: Physician Assistant

## 2024-10-31 ENCOUNTER — Other Ambulatory Visit (HOSPITAL_BASED_OUTPATIENT_CLINIC_OR_DEPARTMENT_OTHER): Payer: Self-pay

## 2024-10-31 ENCOUNTER — Other Ambulatory Visit: Payer: Self-pay

## 2024-10-31 VITALS — BP 108/74 | HR 90 | Temp 98.3°F | Ht 67.0 in | Wt 233.0 lb

## 2024-10-31 DIAGNOSIS — Z6836 Body mass index (BMI) 36.0-36.9, adult: Secondary | ICD-10-CM

## 2024-10-31 DIAGNOSIS — E669 Obesity, unspecified: Secondary | ICD-10-CM | POA: Diagnosis not present

## 2024-10-31 DIAGNOSIS — E559 Vitamin D deficiency, unspecified: Secondary | ICD-10-CM

## 2024-10-31 DIAGNOSIS — I10 Essential (primary) hypertension: Secondary | ICD-10-CM | POA: Diagnosis not present

## 2024-10-31 DIAGNOSIS — Z9884 Bariatric surgery status: Secondary | ICD-10-CM

## 2024-10-31 MED ORDER — VITAMIN D (ERGOCALCIFEROL) 1.25 MG (50000 UNIT) PO CAPS
50000.0000 [IU] | ORAL_CAPSULE | ORAL | 0 refills | Status: AC
  Filled 2024-10-31 – 2024-11-11 (×2): qty 4, 28d supply, fill #0

## 2024-10-31 NOTE — Progress Notes (Signed)
 SUBJECTIVE: Discussed the use of AI scribe software for clinical note transcription with the patient, who gave verbal consent to proceed.  Chief Complaint: Obesity  Interim History: She has maintained her weight her weight since last visit.  Bio impedence scale reviewed with the patient: Muscle mass + 2.0 lbs Adipose mass - 1.8 lbs  Down 11 lbs overall TBW loss of 4.5% [name] is here to discuss her progress with her obesity treatment plan. She is on the keeping a food journal and adhering to recommended goals of 1200-1400 calories and 100 protein and states she is following her eating plan approximately 50 % of the time. She states she is exercising walking 20 minutes 4 times per week.  [name] is a 48 year old female with obesity who presents for follow-up of her obesity treatment plan.  She has a history of obesity and underwent gastric bypass surgery in 2005, which was revised in 2013 and partially revised with bowel resection in 2023. Her lowest weight post-surgery was 144 pounds, but she has since experienced weight regain. She is currently focused on maintaining her weight through the holiday season and has successfully avoided weight gain over Thanksgiving and had a nice shift with increased muscle mass as feels she was more consistently with activity- walking consistently on days off.  She is intentional about her physical activity, particularly walking on her days off, which has contributed to an increase in muscle mass. She enjoys walking outside even in cold weather, using warm clothing to stay comfortable. She is not currently a member of a gym but prefers outdoor activities unless the weather is prohibitive.  She has a history of ADHD and takes Adderall, which affects her appetite. She often forgets to eat when busy and experiences a lack of hunger during the day, leading to sudden hunger in the evening. She tries to eat a good breakfast before taking her medication and  uses protein shakes like [employer] to supplement her intake when she is not hungry.  She is scheduled for an endoscopy due to experiencing severe acid reflux and is trying to avoid eating right before bed to manage her symptoms.  Her past medical history includes hypertension and vitamin D  deficiency. She reports no major issues with excessive hunger or cravings but is mindful of her nutrition, focusing on protein intake and choosing lower-fat options. She has been tracking her food intake mentally rather than journaling.  She has plantar fasciitis, which is currently well-managed. She does not report any issues with her knees or hips but notes difficulty in getting up from low positions without using her arms, indicating a need for core strengthening exercises.  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 Her peak weight before surgery was 320 pounds, and she lost down to 144 pounds over five years.  OBJECTIVE: Visit Diagnoses: Problem List Items Addressed This Visit     Essential hypertension, benign - Primary   Status post bariatric surgery   Vitamin D  deficiency   Relevant Medications   Vitamin D , Ergocalciferol , (DRISDOL ) 1.25 MG (50000 UNIT) CAPS capsule   Other Visit Diagnoses       Obesity (BMI 30-39.9), STARTING BMI 36.8         BMI 36.0-36.9,adult Current BMI 36.6          Obesity Management is ongoing with recent weight maintenance over the holidays. Muscle mass has increased, and  adipose mass has decreased by 2.2 pounds. No excessive hunger reported, but there is a tendency to forget meals due to ADHD and autism. Insulin  levels and A1c are well-controlled. - Continue current dietary strategies, focusing on protein intake and whole foods. - Encouraged journaling food intake at least three days a week to increase mindfulness. - Set alarms to remind her to eat regularly, especially when  not feeling hungry during lunchtime. - Continue outdoor walking as tolerated, and consider indoor exercises like wall planks for core strengthening. - Scheduled follow-up appointment with Dr. Verdon on December 29th, 2025.  Essential hypertension Blood pressure is well-controlled with current management. No medication currently.   BP Readings from Last 3 Encounters:  10/31/24 108/74  10/17/24 107/76  09/26/24 117/80   BP is good off medication currently.  Continue to work on nutrition plan to promote weight loss and improve BP control.   Vitamin D  Deficiency Vitamin D  is not at goal of 50.  Most recent vitamin D  level was 39.1. She is on  prescription ergocalciferol  50,000 IU weekly. No n/V or muscle weakness with Ergocalciferol .  Lab Results  Component Value Date   VD25OH 39.1 06/02/2024   VD25OH 35.82 02/17/2024   VD25OH 40.25 11/10/2022    Plan: Continue and refill  prescription ergocalciferol  50,000 IU weekly Low vitamin D  levels can be associated with adiposity and may result in leptin resistance and weight gain. Also associated with fatigue.  Currently on vitamin D  supplementation without any adverse effects such as nausea, vomiting or muscle weakness.  Meds ordered this encounter  Medications   Vitamin D , Ergocalciferol , (DRISDOL ) 1.25 MG (50000 UNIT) CAPS capsule    Sig: Take 1 capsule (50,000 Units total) by mouth every 7 (seven) days.    Dispense:  4 capsule    Refill:  0   S/P gastric bypass revision For EGD for further evaluation with reflux.  On Prevacid  30 mg BID and Pepcid  40 mg daily.  Has EGD soon to evaluate status as having a lot of reflux issues after by pass revision- Dr. Leigh- 11/08/24.   Vitamin deficiency after bariatric surgery  Continues Fusion plus, B 12 and ERgocalciferol  and MVI.  Has EGD soon to evaluate status as having a lot of reflux issues after by pass revision- Dr. Leigh- 11/08/24.  Continue supplementation. Monitor vitamin  levels, iron  periodically.    Vitals Temp: 98.3 F (36.8 C) BP: 108/74 Pulse Rate: 90 SpO2: 98 %   Anthropometric Measurements Height: 5' 7 (1.702 m) Weight: 233 lb (105.7 kg) BMI (Calculated): 36.48 Weight at Last Visit: 233 lb Weight Lost Since Last Visit: 0 Weight Gained Since Last Visit: 0 Starting Weight: 244 lb Total Weight Loss (lbs): 11 lb (4.99 kg) Peak Weight: 320 lb   Body Composition  Body Fat %: 46.7 % Fat Mass (lbs): 109 lbs Muscle Mass (lbs): 118.4 lbs Total Body Water  (lbs): 84.6 lbs Visceral Fat Rating : 13   Other Clinical Data Fasting: No Labs: No Today's Visit #: 8 Starting Date: 06/02/24     ASSESSMENT AND PLAN:  Diet: Abrina is currently in the action stage of change. As such, her goal is to continue with weight loss efforts. She has agreed to keeping a food journal and adhering to recommended goals of 1200-1400 calories and 100 protein.  Exercise: Destinee has been instructed to work up to a goal of 150 minutes of combined cardio and strengthening exercise per week for weight loss and overall health benefits. Discussed some core  strengthening strategies.    Behavior Modification:  We discussed the following Behavioral Modification Strategies today: increasing lean protein intake, decreasing simple carbohydrates, increasing vegetables, increase H2O intake, increase high fiber foods, meal planning and cooking strategies, holiday eating strategies, avoiding temptations, planning for success, and keep a strict food journal. We discussed various medication options to help Gabrelle with her weight loss efforts and we both agreed to continue to work on nutritional and behavioral strategies to promote weight loss.  .  Return in about 3 weeks (around 11/21/2024).SABRA She was informed of the importance of frequent follow up visits to maximize her success with intensive lifestyle modifications for her multiple health conditions.  Attestation Statements:    Reviewed by clinician on day of visit: allergies, medications, problem list, medical history, surgical history, family history, social history, and previous encounter notes.   Time spent on visit including pre-visit chart review and post-visit care and charting was 35 minutes.    Velmer Woelfel, PA-C

## 2024-11-02 ENCOUNTER — Encounter: Payer: Self-pay | Admitting: Gastroenterology

## 2024-11-07 DIAGNOSIS — F4323 Adjustment disorder with mixed anxiety and depressed mood: Secondary | ICD-10-CM | POA: Diagnosis not present

## 2024-11-08 ENCOUNTER — Ambulatory Visit: Admitting: Gastroenterology

## 2024-11-08 ENCOUNTER — Other Ambulatory Visit (HOSPITAL_BASED_OUTPATIENT_CLINIC_OR_DEPARTMENT_OTHER): Payer: Self-pay

## 2024-11-08 ENCOUNTER — Encounter: Payer: Self-pay | Admitting: Gastroenterology

## 2024-11-08 VITALS — BP 141/98 | HR 76 | Temp 97.7°F | Resp 13 | Ht 67.0 in | Wt 232.0 lb

## 2024-11-08 DIAGNOSIS — Z9884 Bariatric surgery status: Secondary | ICD-10-CM

## 2024-11-08 DIAGNOSIS — K2100 Gastro-esophageal reflux disease with esophagitis, without bleeding: Secondary | ICD-10-CM | POA: Diagnosis not present

## 2024-11-08 DIAGNOSIS — G4733 Obstructive sleep apnea (adult) (pediatric): Secondary | ICD-10-CM | POA: Diagnosis not present

## 2024-11-08 DIAGNOSIS — F411 Generalized anxiety disorder: Secondary | ICD-10-CM | POA: Diagnosis not present

## 2024-11-08 MED ORDER — SODIUM CHLORIDE 0.9 % IV SOLN: 500.0000 mL | INTRAVENOUS | Status: DC

## 2024-11-08 MED ORDER — SUCRALFATE 1 GM/10ML PO SUSP
1.0000 g | Freq: Three times a day (TID) | ORAL | 1 refills | Status: DC | PRN
  Filled 2024-11-08: qty 420, 14d supply, fill #0

## 2024-11-08 NOTE — Progress Notes (Signed)
 Pt's states no medical or surgical changes since previsit or office visit.

## 2024-11-08 NOTE — Op Note (Signed)
 [facility] Patient Name: [name] Procedure Date: 11/08/2024 9:58 AM MRN: [id number] Endoscopist: Elspeth P. Leigh , MD, [id number] Age: 48 Referring MD:  Date of Birth: [birth year] Gender: Female Account #: [id number] Procedure:                Upper GI endoscopy Indications:              follow-up of GERD - history of Roux-en-Y gastric                            bypass s/p revisional surgery for internal hernia.                            Has failed multiple PPIs (most recently nexium                             twice daily and prevacid  30mg  / day) Medicines:                Monitored Anesthesia Care Procedure:                Pre-Anesthesia Assessment:                           - Prior to the procedure, a History and Physical                            was performed, and patient medications and                            allergies were reviewed. The patient's tolerance of                            previous anesthesia was also reviewed. The risks                            and benefits of the procedure and the sedation                            options and risks were discussed with the patient.                            All questions were answered, and informed consent                            was obtained. Prior Anticoagulants: The patient has                            taken no anticoagulant or antiplatelet agents. ASA                            Grade Assessment: III - A patient with severe                            systemic disease. After reviewing the risks and  benefits, the patient was deemed in satisfactory                            condition to undergo the procedure.                           After obtaining informed consent, the endoscope was                            passed under direct vision. Throughout the                            procedure, the patient's blood pressure, pulse, and                            oxygen  saturations were monitored continuously. The                            Olympus Scope [id number] was introduced through the                            mouth, and advanced to the proximal jejunum. The                            upper GI endoscopy was accomplished without                            difficulty. The patient tolerated the procedure                            well. Scope In: Scope Out: Findings:                 The Z-line was regular and was found 33 cm from the                            incisors.                           LA Grade B esophagitis was found in the distal                            esophagus.                           The exam of the esophagus was otherwise normal.                           Evidence of a gastric bypass was found. One visible                            staple noted. A gastric pouch was found which                            appeared small. Retroflexed views not obtained.  The exam of the gastric pouch was otherwise normal.                           The examined jejunal limb was normal. Complications:            No immediate complications. Estimated blood loss:                            None. Estimated Blood Loss:     Estimated blood loss: none. Impression:               - Z-line regular, 33 cm from the incisors.                           - LA Grade B reflux esophagitis.                           - Gastric bypass. No marginal ulcers.                           - Small gastric pouch                           - Normal examined jejunal limb                           Patient is currently failing high dose Prevacid  +                            Pepcid  with active esophagitis, recommend                            escalation of therapy if insurance allows                            (previously denied prescriptions for Voquezna  and                            Dexilant ) Recommendation:           - Patient has a contact number  available for                            emergencies. The signs and symptoms of potential                            delayed complications were discussed with the                            patient. Return to normal activities tomorrow.                            Written discharge instructions were provided to the                            patient.                           -  Resume previous diet.                           - Continue present medications.                           - Stop Prevacid                            - Start samples of Voquezna  20mg  / day for erosive                            esophagitis, if we have samples on file, and                            prescribe if she clinically responds                           - Liquid carafate  10cc every 8 hours PRN for                            breakthrough if not tried yet Elspeth P. Ethie Curless, MD 11/08/2024 10:30:10 AM This report has been signed electronically.

## 2024-11-08 NOTE — Progress Notes (Signed)
 [facility] History and Physical   Primary Care Physician:  Daryl Setter, [name]   Reason for Procedure:   [place], history of Roux-en-Y with gastric ulcers / revision surgery  Plan:    EGD     HPI: [name] is a 48 y.o. female  here for EGD - history of roux-en-Y gastric bypass with multiple revisions, worsening GERD symptoms. Currently on prevacid  30mg  and pepcid  BID and not working. BReakthrough symptoms.   Otherwise feels well without any cardiopulmonary symptoms.   I have discussed risks / benefits of anesthesia and endoscopic procedure with Arvella L Mckell and they wish to proceed with the exams as outlined today.   The patient was provided an opportunity to ask questions and all were answered. The patient agreed with the plan.    Past Medical History:  Diagnosis Date   ADHD (attention deficit hyperactivity disorder)    Anemia    Anxiety with depression    Autism    Level 1   B12 deficiency    Back pain    Chronic fatigue    Depression with anxiety    Edema of both lower extremities    Excessive sleepiness    GAD (generalized anxiety disorder)    Gallbladder problem    GERD (gastroesophageal reflux disease)    Headache(784.0)    High blood pressure    History of gastric ulcer    partial ulcer perferation gastrojujunal anastomosis  post gastric bypass  -- s/p  surgical repair 2012 and 2013   History of palpitations in adulthood    per pt had occasional palpitations ,  told had PVCs   History of seizures as a child    febrile   History of stomach ulcers    IBS (irritable bowel syndrome)    IDA (iron  deficiency anemia)    Infertility, female    Insomnia    Insomnia    Irritable bowel syndrome with diarrhea    MDD (major depressive disorder)    Migraines    OSA (obstructive sleep apnea)    history  used cpap 2013-2014 now off.   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/  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;   anastomtic ulcer s/p bypass s/p repair x2 2012 and 2013;   Sleep apnea    SOBOE (shortness of breath on exertion)    SOBOE (shortness of breath on exertion)    SUI (stress urinary incontinence, female)    Wears glasses     Past Surgical History:  Procedure Laterality Date   BLADDER SUSPENSION N/A 12/09/2023   Procedure: TRANSVAGINAL TAPE (TVT) PROCEDURE;  Surgeon: Marilynne Rosaline SAILOR, MD;  Location: [facility] ;  Service: Gynecology;  Laterality: N/A;   COLONOSCOPY     COLONOSCOPY WITH PROPOFOL   2023   CYSTOSCOPY N/A 12/09/2023   Procedure: CYSTOSCOPY;  Surgeon: Marilynne Rosaline SAILOR, MD;  Location: [facility];  Service: Gynecology;  Laterality: N/A;   DILITATION & CURRETTAGE/HYSTROSCOPY WITH ESSURE  2015   bilateral tubal sterilization with essure   ESOPHAGOGASTRODUODENOSCOPY  11/28/2011   Procedure: ESOPHAGOGASTRODUODENOSCOPY (EGD);  Surgeon: Belvie JONETTA Just, MD;  Location: [facility];  Service: Endoscopy;  Laterality: N/A;   LAPAROSCOPIC CHOLECYSTECTOMY  2006   LAPAROSCOPIC GASTROTOMY W/ REPAIR OF ULCER  10/23/2009   @[facility] ;   by dr s. gross;  EXTENSIVE LYSIS ADHESIONS/  OMENTAL PATCH OF ULCER   LAPAROSCOPY ABDOMEN DIAGNOSTIC  02/21/2011   @[facility]  by dr forbes. wilson/ dr b. sebastian;  upper endoscopy/  LAPAROSCOPIC OMENTAL PATCH REPAIR GASTROJEJUNAL ANASTOMTIC PERFORATION / PLACEMENT DRAIN   ROBOTIC ASSISTED LAPAROSCOPIC OVARIAN CYSTECTOMY Right 12/09/2023   Procedure: XI ROBOTIC ASSISTED LAPAROSCOPIC OVARIAN CYSTECTOMY;  Surgeon: Marilynne Rosaline SAILOR, MD;  Location: [facility];  Service: Gynecology;  Laterality: Right;   ROUX-EN-Y GASTRIC BYPASS  2005   @ [facility] by dr sherlean sous;  laparoscopic   UPPER GASTROINTESTINAL ENDOSCOPY     VENTRAL HERNIA REPAIR  12/22/2011   Procedure: HERNIA REPAIR VENTRAL ADULT;  Surgeon: Donnice KATHEE Lunger, MD;  Location: [facility];   Service: General;;  RESECTION/ RE-DO  OF PRIOR GASTROJEJUNOSTOMY (GASTRIC BYPASS REVISION)   XI ROBOTIC ASSISTED SMALL BOWEL RESECTION  06/05/2022   @ [facility] by dr shaunna. chandler;  for intussusception intestine   XI ROBOTIC ASSISTED TOTAL HYSTERECTOMY WITH SACROCOLPOPEXY N/A 12/09/2023   Procedure: XI ROBOTIC ASSISTED TOTAL HYSTERECTOMY WITH BILATERAL SALPINGECTOMY AND SACROCOLPOPEXY;  Surgeon: Marilynne Rosaline SAILOR, MD;  Location: [facility];  Service: Gynecology;  Laterality: N/A;    Prior to Admission medications  Medication Sig Start Date End Date Taking? Authorizing Provider  acetaminophen  (TYLENOL ) 500 MG tablet Take 500 mg by mouth every 6 (six) hours as needed.   Yes [provider]  amphetamine -dextroamphetamine  (ADDERALL) 20 MG tablet Take 1 tablet (20 mg total) by mouth 2 (two) times daily. 11/25/24  Yes   busPIRone  (BUSPAR ) 30 MG tablet Take 1 tablet (30 mg total) by mouth 2 (two) times daily with food. 03/17/24  Yes   Calcium Carb-Cholecalciferol (CHEWABLE CALCIUM/D3 PO) Take by mouth 2 (two) times daily. One chew twice daily 08/01/22  Yes [provider]  cyanocobalamin  500 MCG tablet Take 500 mcg by mouth daily.   Yes [provider]  DULoxetine  (CYMBALTA ) 30 MG capsule Take 3 capsules (90 mg total) by mouth daily. 12/11/23  Yes   estradiol  (VIVELLE -DOT) 0.1 MG/24HR patch Place 1 patch (0.1 mg total) onto the skin 2 (two) times a week. 09/26/24  Yes Anyanwu, Ugonna A, MD  famotidine  (PEPCID ) 40 MG tablet Take 1 tablet (40 mg total) by mouth daily before breakfast. 09/20/24 09/15/25 Yes [place], PA-C  Iron -FA-B Cmp-C-Biot-Probiotic (FUSION PLUS) CAPS Take 1 capsule by mouth daily. 08/22/12  Yes [provider]  lansoprazole  (PREVACID ) 30 MG capsule Take 1 capsule (30 mg total) by mouth 2 (two) times daily before a meal. 10/12/24  Yes [place], PA-C  LORazepam  (ATIVAN ) 1 MG tablet Take 1 tablet (1 mg total) by mouth every 12 (twelve)  hours as needed for severe anxiety. 06/28/24  Yes   Multiple Vitamins-Minerals (MULTIVITAMIN WITH MINERALS) tablet Take 1 tablet by mouth daily. 02/19/24  Yes Daryl Setter, [name]  nystatin  powder Apply 1 Application topically 2 (two) times daily as needed. 02/17/24  Yes O'Sullivan, Melissa, [name]  pramipexole  (MIRAPEX ) 1.5 MG tablet Take one tablet at bedtime 03/17/24  Yes   Rimegepant Sulfate  (NURTEC) 75 MG TBDP Take 1 tablet by mouth once daily as needed for migraine 07/27/24  Yes Ines Onetha KATHEE, MD  traZODone  (DESYREL ) 100 MG tablet Take 1-2 tablets (100-200 mg total) by mouth at bedtime as needed for insomnia. 12/11/23  Yes   Vitamin D , Ergocalciferol , (DRISDOL ) 1.25 MG (50000 UNIT) CAPS capsule Take 1 capsule (50,000 Units total) by mouth every 7 (seven) days. 10/31/24  Yes Rayburn, Elouise Phlegm, PA-C  amphetamine -dextroamphetamine  (ADDERALL)  20 MG tablet Take 1 tablet (20 mg total) by mouth 2 (two) times daily. 10/26/24     busPIRone  (BUSPAR ) 30 MG tablet Take 1 tablet (30 mg total) by mouth 2 (two) times daily with food. 06/28/24     DULoxetine  (CYMBALTA ) 30 MG capsule Take 3 capsules (90 mg total) by mouth daily. 06/28/24     DULoxetine  (CYMBALTA ) 30 MG capsule Take 3 capsules (90 mg total) by mouth daily. 08/26/24     DULoxetine  (CYMBALTA ) 30 MG capsule Take 3 capsules (90 mg total) by mouth daily. 09/29/24     hyoscyamine  (LEVSIN ) 0.125 MG tablet Take 1 tablet (0.125 mg total) by mouth every 4 (four) hours as needed. 09/20/24 03/19/25  [place], PA-C  LORazepam  (ATIVAN ) 1 MG tablet Take 1 tablet (1 mg total) by mouth every 12 (twelve) hours as needed, for severe anxiety. 09/09/24     LORazepam  (ATIVAN ) 1 MG tablet Take 1 tablet (1 mg total) by mouth every 12 (twelve) hours as needed for severe anxiety. 10/09/24     LORazepam  (ATIVAN ) 1 MG tablet Take 1 tablet (1 mg total) by mouth every 12 (twelve) hours as needed for severe anxiety. 12/08/24     LORazepam  (ATIVAN ) 1 MG tablet Take 1 tablet (1 mg  total) by mouth every 12 (twelve) hours as needed. 11/08/24     pramipexole  (MIRAPEX ) 1.5 MG tablet Take 1 tablet (1.5 mg total) by mouth at bedtime. 07/25/24     pramipexole  (MIRAPEX ) 1.5 MG tablet Take 1 tablet (1.5 mg total) by mouth at bedtime. 08/26/24     pramipexole  (MIRAPEX ) 1.5 MG tablet Take 1 tablet (1.5 mg total) by mouth at bedtime. 09/29/24     traZODone  (DESYREL ) 100 MG tablet Take 1-2 tablets (100-200 mg total) by mouth at bedtime as needed for insomnia. 03/17/24     traZODone  (DESYREL ) 100 MG tablet Take 1-2 tablets (100-200 mg total) by mouth at bedtime as needed for insomnia. 07/25/24     traZODone  (DESYREL ) 100 MG tablet Take 1-2 tablets (100-200 mg total) by mouth at bedtime as needed for insomnia. 08/26/24     traZODone  (DESYREL ) 100 MG tablet Take 1-2 tablets (100-200 mg total) by mouth at bedtime as needed for insomnia. 09/29/24       Current Outpatient Medications  Medication Sig Dispense Refill   acetaminophen  (TYLENOL ) 500 MG tablet Take 500 mg by mouth every 6 (six) hours as needed.     [START ON 11/25/2024] amphetamine -dextroamphetamine  (ADDERALL) 20 MG tablet Take 1 tablet (20 mg total) by mouth 2 (two) times daily. 60 tablet 0   busPIRone  (BUSPAR ) 30 MG tablet Take 1 tablet (30 mg total) by mouth 2 (two) times daily with food. 60 tablet 3   Calcium Carb-Cholecalciferol (CHEWABLE CALCIUM/D3 PO) Take by mouth 2 (two) times daily. One chew twice daily     cyanocobalamin  500 MCG tablet Take 500 mcg by mouth daily.     DULoxetine  (CYMBALTA ) 30 MG capsule Take 3 capsules (90 mg total) by mouth daily. 90 capsule 3   estradiol  (VIVELLE -DOT) 0.1 MG/24HR patch Place 1 patch (0.1 mg total) onto the skin 2 (two) times a week. 8 patch 12   famotidine  (PEPCID ) 40 MG tablet Take 1 tablet (40 mg total) by mouth daily before breakfast. 90 tablet 3   Iron -FA-B Cmp-C-Biot-Probiotic (FUSION PLUS) CAPS Take 1 capsule by mouth daily.     lansoprazole  (PREVACID ) 30 MG capsule Take 1 capsule (30 mg  total) by mouth 2 (two) times  daily before a meal. 180 capsule 3   LORazepam  (ATIVAN ) 1 MG tablet Take 1 tablet (1 mg total) by mouth every 12 (twelve) hours as needed for severe anxiety. 60 tablet 0   Multiple Vitamins-Minerals (MULTIVITAMIN WITH MINERALS) tablet Take 1 tablet by mouth daily.     nystatin  powder Apply 1 Application topically 2 (two) times daily as needed. 30 g 5   pramipexole  (MIRAPEX ) 1.5 MG tablet Take one tablet at bedtime 30 tablet 3   Rimegepant Sulfate  (NURTEC) 75 MG TBDP Take 1 tablet by mouth once daily as needed for migraine 16 tablet 11   traZODone  (DESYREL ) 100 MG tablet Take 1-2 tablets (100-200 mg total) by mouth at bedtime as needed for insomnia. 60 tablet 2   Vitamin D , Ergocalciferol , (DRISDOL ) 1.25 MG (50000 UNIT) CAPS capsule Take 1 capsule (50,000 Units total) by mouth every 7 (seven) days. 4 capsule 0   amphetamine -dextroamphetamine  (ADDERALL) 20 MG tablet Take 1 tablet (20 mg total) by mouth 2 (two) times daily. 60 tablet 0   busPIRone  (BUSPAR ) 30 MG tablet Take 1 tablet (30 mg total) by mouth 2 (two) times daily with food. 60 tablet 0   DULoxetine  (CYMBALTA ) 30 MG capsule Take 3 capsules (90 mg total) by mouth daily. 90 capsule 0   DULoxetine  (CYMBALTA ) 30 MG capsule Take 3 capsules (90 mg total) by mouth daily. 90 capsule 2   DULoxetine  (CYMBALTA ) 30 MG capsule Take 3 capsules (90 mg total) by mouth daily. 90 capsule 2   hyoscyamine  (LEVSIN ) 0.125 MG tablet Take 1 tablet (0.125 mg total) by mouth every 4 (four) hours as needed. 90 tablet 5   LORazepam  (ATIVAN ) 1 MG tablet Take 1 tablet (1 mg total) by mouth every 12 (twelve) hours as needed, for severe anxiety. 60 tablet 0   LORazepam  (ATIVAN ) 1 MG tablet Take 1 tablet (1 mg total) by mouth every 12 (twelve) hours as needed for severe anxiety. 60 tablet 0   [START ON 12/08/2024] LORazepam  (ATIVAN ) 1 MG tablet Take 1 tablet (1 mg total) by mouth every 12 (twelve) hours as needed for severe anxiety. 60 tablet 0    LORazepam  (ATIVAN ) 1 MG tablet Take 1 tablet (1 mg total) by mouth every 12 (twelve) hours as needed. 60 tablet 0   pramipexole  (MIRAPEX ) 1.5 MG tablet Take 1 tablet (1.5 mg total) by mouth at bedtime. 30 tablet 0   pramipexole  (MIRAPEX ) 1.5 MG tablet Take 1 tablet (1.5 mg total) by mouth at bedtime. 30 tablet 2   pramipexole  (MIRAPEX ) 1.5 MG tablet Take 1 tablet (1.5 mg total) by mouth at bedtime. 30 tablet 2   traZODone  (DESYREL ) 100 MG tablet Take 1-2 tablets (100-200 mg total) by mouth at bedtime as needed for insomnia. 60 tablet 2   traZODone  (DESYREL ) 100 MG tablet Take 1-2 tablets (100-200 mg total) by mouth at bedtime as needed for insomnia. 60 tablet 0   traZODone  (DESYREL ) 100 MG tablet Take 1-2 tablets (100-200 mg total) by mouth at bedtime as needed for insomnia. 60 tablet 2   traZODone  (DESYREL ) 100 MG tablet Take 1-2 tablets (100-200 mg total) by mouth at bedtime as needed for insomnia. 60 tablet 2   Current Facility-Administered Medications  Medication Dose Route Frequency Provider Last Rate Last Admin   0.9 %  sodium chloride  infusion  500 mL Intravenous Continuous Wavie Hashimi, Elspeth SQUIBB, MD        Allergies as of 11/08/2024 - Review Complete 11/08/2024  Allergen Reaction Noted  Nsaids Other (See Comments) 03/30/2014    Family History  Problem Relation Age of Onset   Stroke Mother    Hypertension Mother    CVA Mother    Heart disease Mother        fibroelastoma on her heart, CAD, CABG   Cerebral aneurysm Mother    Sleep apnea Mother    Obesity Mother    Cancer Father    COPD Father    Liver cancer Father    Alcohol abuse Father    Alcoholism Father    Drug abuse Father    Drug abuse Brother    Alcohol abuse Brother    Drug abuse Brother    Alcohol abuse Brother    Heart disease Maternal Grandmother    Aneurysm Maternal Grandfather    Diabetes Paternal Grandmother    Drug abuse Son        in recovery   Asthma Son    Depression Son    ADD / ADHD Son     Anxiety disorder Son    Tourette syndrome Son    Anesthesia problems Neg Hx    Hypotension Neg Hx    Malignant hyperthermia Neg Hx    Pseudochol deficiency Neg Hx    Migraines Neg Hx    Esophageal cancer Neg Hx    Colon cancer Neg Hx    Rectal cancer Neg Hx    Stomach cancer Neg Hx     Social History   Socioeconomic History   Marital status: Married    Spouse name: Selinda   Number of children: 2   Years of education: Not on file   Highest education level: Bachelor's degree (e.g., BA, AB, BS)  Occupational History   Not on file  Tobacco Use   Smoking status: Never    Passive exposure: Past   Smokeless tobacco: Never  Vaping Use   Vaping status: Never Used  Substance and Sexual Activity   Alcohol use: No   Drug use: Never   Sexual activity: Yes    Birth control/protection: Surgical    Comment: hysteroscopic bilateral essure procedure  Other Topics Concern   Not on file  Social History Narrative   Married   2 sons (one is grown)   One son at home   Step daughter at home    2 step sons out of the house   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    Social Drivers of Health   Tobacco Use: Low Risk (11/08/2024)   Patient History    Smoking Tobacco Use: Never    Smokeless Tobacco Use: Never    Passive Exposure: Past  Financial Resource Strain: Low Risk (08/04/2024)   Overall Financial Resource Strain (CARDIA)    Difficulty of Paying Living Expenses: Not hard at all  Food Insecurity: No Food Insecurity (08/04/2024)   Epic    Worried About Programme Researcher, Broadcasting/film/video in the Last Year: Never true    Ran Out of Food in the Last Year: Never true  Transportation Needs: No Transportation Needs (08/04/2024)   Epic    Lack of Transportation (Medical): No    Lack of Transportation (Non-Medical): No  Physical Activity: Insufficiently Active (08/04/2024)   Exercise Vital Sign    Days of Exercise per Week: 3 days     Minutes of Exercise per Session: 10 min  Stress: No Stress Concern Present (08/04/2024)   [employer] of  Occupational Health - Occupational Stress Questionnaire    Feeling of Stress: Only a little  Social Connections: Socially Integrated (08/04/2024)   Social Connection and Isolation Panel    Frequency of Communication with Friends and Family: Once a week    Frequency of Social Gatherings with Friends and Family: More than three times a week    Attends Religious Services: 1 to 4 times per year    Active Member of [employer] or Organizations: Yes    Attends Engineer, Structural: More than 4 times per year    Marital Status: Married  Catering Manager Violence: Not At Risk (02/20/2022)   Received from [facility]   HITS    Over the last 12 months how often did your partner physically hurt you?: Never    Over the last 12 months how often did your partner insult you or talk down to you?: Never    Over the last 12 months how often did your partner threaten you with physical harm?: Never    Over the last 12 months how often did your partner scream or curse at you?: Never  Depression (PHQ2-9): High Risk (08/10/2024)   Depression (PHQ2-9)    PHQ-2 Score: 12  Alcohol Screen: Low Risk (08/04/2024)   Alcohol Screen    Last Alcohol Screening Score (AUDIT): 1  Housing: Low Risk (08/04/2024)   Epic    Unable to Pay for Housing in the Last Year: No    Number of Times Moved in the Last Year: 0    Homeless in the Last Year: No  Utilities: Not on file  Health Literacy: Not on file    Review of Systems: All other review of systems negative except as mentioned in the HPI.  Physical Exam: Vital signs BP 125/78   Pulse 73   Temp 97.7 F (36.5 C) (Temporal)   Ht 5' 7 (1.702 m)   Wt 232 lb (105.2 kg)   SpO2 98%   BMI 36.34 kg/m   General:   Alert,  Well-developed, pleasant and cooperative in NAD Lungs:  Clear throughout to auscultation.   Heart:  Regular rate and rhythm Abdomen:   Soft, nontender and nondistended.   Neuro/Psych:  Alert and cooperative. Normal mood and affect. A and O x 3  Marcey Naval, MD [facility]

## 2024-11-08 NOTE — Progress Notes (Deleted)
 Called to room to assist during endoscopic procedure.  Patient ID and intended procedure confirmed with present staff. Received instructions for my participation in the procedure from the performing physician.

## 2024-11-08 NOTE — Patient Instructions (Addendum)
 Discharge instructions given.  Prescription sent to pharmacy. Resume previous medications. YOU HAD AN ENDOSCOPIC PROCEDURE TODAY AT THE [facility]:   Refer to the procedure report that was given to you for any specific questions about what was found during the examination.  If the procedure report does not answer your questions, please call your gastroenterologist to clarify.  If you requested that your care partner not be given the details of your procedure findings, then the procedure report has been included in a sealed envelope for you to review at your convenience later.  YOU SHOULD EXPECT: Some feelings of bloating in the abdomen. Passage of more gas than usual.  Walking can help get rid of the air that was put into your GI tract during the procedure and reduce the bloating. If you had a lower endoscopy (such as a colonoscopy or flexible sigmoidoscopy) you may notice spotting of blood in your stool or on the toilet paper. If you underwent a bowel prep for your procedure, you may not have a normal bowel movement for a few days.  Please Note:  You might notice some irritation and congestion in your nose or some drainage.  This is from the oxygen used during your procedure.  There is no need for concern and it should clear up in a day or so.  SYMPTOMS TO REPORT IMMEDIATELY:   Following upper endoscopy (EGD)  Vomiting of blood or coffee ground material  New chest pain or pain under the shoulder blades  Painful or persistently difficult swallowing  New shortness of breath  Fever of 100F or higher  Black, tarry-looking stools  For urgent or emergent issues, a gastroenterologist can be reached at any hour by calling [phone]. Do not use MyChart messaging for urgent concerns.    DIET:  We do recommend a small meal at first, but then you may proceed to your regular diet.  Drink plenty of fluids but you should avoid alcoholic beverages for 24 hours.  ACTIVITY:  You should  plan to take it easy for the rest of today and you should NOT DRIVE or use heavy machinery until tomorrow (because of the sedation medicines used during the test).    FOLLOW UP: Our staff will call the number listed on your records the next business day following your procedure.  We will call around 7:15- 8:00 am to check on you and address any questions or concerns that you may have regarding the information given to you following your procedure. If we do not reach you, we will leave a message.     If any biopsies were taken you will be contacted by phone or by letter within the next 1-3 weeks.  Please call us  at [phone] if you have not heard about the biopsies in 3 weeks.    SIGNATURES/CONFIDENTIALITY: You and/or your care partner have signed paperwork which will be entered into your electronic medical record.  These signatures attest to the fact that that the information above on your After Visit Summary has been reviewed and is understood.  Full responsibility of the confidentiality of this discharge information lies with you and/or your care-partner.

## 2024-11-09 ENCOUNTER — Telehealth: Payer: Self-pay | Admitting: *Deleted

## 2024-11-09 NOTE — Telephone Encounter (Signed)
 No answer for follow up call. Left a message.

## 2024-11-11 ENCOUNTER — Other Ambulatory Visit (HOSPITAL_COMMUNITY): Payer: Self-pay

## 2024-11-11 ENCOUNTER — Other Ambulatory Visit: Payer: Self-pay

## 2024-11-11 ENCOUNTER — Other Ambulatory Visit (HOSPITAL_BASED_OUTPATIENT_CLINIC_OR_DEPARTMENT_OTHER): Payer: Self-pay

## 2024-11-12 ENCOUNTER — Encounter: Payer: Self-pay | Admitting: Gastroenterology

## 2024-11-14 NOTE — Telephone Encounter (Signed)
 There is no active script on patient chart for me to use as reference. Are we re-processing for the 20MG  tablets or submitting for the 10MG  tablets?

## 2024-11-14 NOTE — Telephone Encounter (Signed)
 20mg  daily per her procedure note.

## 2024-11-15 ENCOUNTER — Telehealth: Payer: Self-pay

## 2024-11-15 ENCOUNTER — Other Ambulatory Visit (HOSPITAL_COMMUNITY): Payer: Self-pay

## 2024-11-15 NOTE — Telephone Encounter (Signed)
 Pharmacy Patient Advocate Encounter   Received notification from Patient Advice Request messages that prior authorization for Voquezna  20MG  tablets is required/requested.   Insurance verification completed.   The patient is insured through [facility].   Per test claim: PA required; PA submitted to above mentioned insurance via Latent Key/confirmation #/EOC [id number] Status is pending

## 2024-11-15 NOTE — Telephone Encounter (Signed)
 PA request has been Submitted. New Encounter has been or will be created for follow up. For additional info see Pharmacy Prior Auth telephone encounter from 11/15/2024.

## 2024-11-16 ENCOUNTER — Other Ambulatory Visit (HOSPITAL_BASED_OUTPATIENT_CLINIC_OR_DEPARTMENT_OTHER): Payer: Self-pay

## 2024-11-18 MED ORDER — VOQUEZNA 20 MG PO TABS: 1.0000 | ORAL_TABLET | Freq: Every day | ORAL | 1 refills | Status: DC

## 2024-11-18 NOTE — Addendum Note (Signed)
 Addended by: Corran Lalone K on: 11/18/2024 03:31 PM   Modules accepted: Orders

## 2024-11-18 NOTE — Telephone Encounter (Signed)
 Pharmacy Patient Advocate Encounter  Received notification from [facility] that Prior Authorization for Voquezna  20MG  tablets has been APPROVED from 11-18-2024 to 01-13-2025   PA #/Case ID/Reference #: [id number]

## 2024-11-18 NOTE — Telephone Encounter (Signed)
 Resent Voquezna  20 mg with message to pharmacy that PA had been approved

## 2024-11-21 ENCOUNTER — Ambulatory Visit (INDEPENDENT_AMBULATORY_CARE_PROVIDER_SITE_OTHER): Admitting: Family Medicine

## 2024-11-21 ENCOUNTER — Encounter (INDEPENDENT_AMBULATORY_CARE_PROVIDER_SITE_OTHER): Payer: Self-pay

## 2024-11-21 DIAGNOSIS — F4323 Adjustment disorder with mixed anxiety and depressed mood: Secondary | ICD-10-CM | POA: Diagnosis not present

## 2024-11-30 ENCOUNTER — Telehealth: Payer: Self-pay

## 2024-11-30 ENCOUNTER — Other Ambulatory Visit: Payer: Self-pay

## 2024-11-30 ENCOUNTER — Other Ambulatory Visit (HOSPITAL_BASED_OUTPATIENT_CLINIC_OR_DEPARTMENT_OTHER): Payer: Self-pay

## 2024-11-30 ENCOUNTER — Encounter: Payer: Self-pay | Admitting: Pharmacist

## 2024-11-30 ENCOUNTER — Other Ambulatory Visit (HOSPITAL_COMMUNITY): Payer: Self-pay

## 2024-11-30 ENCOUNTER — Encounter: Payer: Self-pay | Admitting: Obstetrics & Gynecology

## 2024-11-30 NOTE — Telephone Encounter (Signed)
 Copied from CRM [id number]. Topic: Appointments - Transfer of Care >> Nov 30, 2024  9:34 AM Jayma L wrote: Pt is requesting to transfer FROM: melissa osullivan Pt is requesting to transfer TO: erica wallace Reason for requested transfer: new provider  It is the responsibility of the team the patient would like to transfer to (Dr. Osullivan ) to reach out to the patient if for any reason this transfer is not acceptable.

## 2024-11-30 NOTE — Telephone Encounter (Signed)
 OK with me.

## 2024-12-01 ENCOUNTER — Other Ambulatory Visit: Payer: Self-pay | Admitting: *Deleted

## 2024-12-01 ENCOUNTER — Other Ambulatory Visit (HOSPITAL_COMMUNITY): Payer: Self-pay

## 2024-12-01 ENCOUNTER — Other Ambulatory Visit: Payer: Self-pay

## 2024-12-01 ENCOUNTER — Other Ambulatory Visit: Payer: Self-pay | Admitting: Gastroenterology

## 2024-12-01 MED ORDER — ESTRADIOL 0.1 MG/24HR TD PTTW
1.0000 | MEDICATED_PATCH | TRANSDERMAL | 1 refills | Status: AC
  Filled 2024-12-12 – 2024-12-24 (×2): qty 24, 84d supply, fill #0

## 2024-12-01 MED ORDER — VOQUEZNA 20 MG PO TABS
1.0000 | ORAL_TABLET | Freq: Every day | ORAL | 1 refills | Status: AC
  Filled 2024-12-01 – 2024-12-17 (×2): qty 90, 90d supply, fill #0

## 2024-12-01 MED ORDER — VOQUEZNA 10 MG PO TABS
20.0000 mg | ORAL_TABLET | Freq: Every day | ORAL | 1 refills | Status: AC
  Filled 2024-12-01: qty 30, 30d supply, fill #0

## 2024-12-02 NOTE — Telephone Encounter (Signed)
 These do not need to be converted to tel enc anymore. Prior to us  going live with E2C2 10/2023 we would get permission from both providers (existing PCP and the one pt requests being transferred to). Now, pts are allowed to transfer without approval. I just check that an appt was scheduled and determine if there is anything additional from the CRM that would be useful in the Appt Notes for the scheduled visit.

## 2024-12-09 ENCOUNTER — Other Ambulatory Visit (HOSPITAL_COMMUNITY): Payer: Self-pay

## 2024-12-11 ENCOUNTER — Other Ambulatory Visit (HOSPITAL_COMMUNITY): Payer: Self-pay

## 2024-12-11 ENCOUNTER — Other Ambulatory Visit (HOSPITAL_BASED_OUTPATIENT_CLINIC_OR_DEPARTMENT_OTHER): Payer: Self-pay

## 2024-12-12 ENCOUNTER — Other Ambulatory Visit: Payer: Self-pay

## 2024-12-12 ENCOUNTER — Telehealth: Admitting: Physician Assistant

## 2024-12-12 ENCOUNTER — Other Ambulatory Visit (HOSPITAL_BASED_OUTPATIENT_CLINIC_OR_DEPARTMENT_OTHER): Payer: Self-pay

## 2024-12-12 DIAGNOSIS — N760 Acute vaginitis: Secondary | ICD-10-CM | POA: Diagnosis not present

## 2024-12-12 DIAGNOSIS — B9689 Other specified bacterial agents as the cause of diseases classified elsewhere: Secondary | ICD-10-CM | POA: Diagnosis not present

## 2024-12-12 MED ORDER — METRONIDAZOLE 500 MG PO TABS
500.0000 mg | ORAL_TABLET | Freq: Two times a day (BID) | ORAL | 0 refills | Status: AC
  Filled 2024-12-12: qty 14, 7d supply, fill #0

## 2024-12-12 NOTE — Progress Notes (Signed)

## 2024-12-14 ENCOUNTER — Institutional Professional Consult (permissible substitution): Admitting: Neurology

## 2024-12-14 ENCOUNTER — Other Ambulatory Visit (HOSPITAL_BASED_OUTPATIENT_CLINIC_OR_DEPARTMENT_OTHER): Payer: Self-pay

## 2024-12-17 ENCOUNTER — Other Ambulatory Visit (HOSPITAL_COMMUNITY): Payer: Self-pay

## 2024-12-19 ENCOUNTER — Other Ambulatory Visit (HOSPITAL_COMMUNITY): Payer: Self-pay

## 2024-12-19 ENCOUNTER — Ambulatory Visit (INDEPENDENT_AMBULATORY_CARE_PROVIDER_SITE_OTHER): Admitting: Family Medicine

## 2024-12-20 ENCOUNTER — Other Ambulatory Visit: Payer: Self-pay

## 2024-12-21 ENCOUNTER — Other Ambulatory Visit (HOSPITAL_COMMUNITY): Payer: Self-pay

## 2024-12-21 ENCOUNTER — Other Ambulatory Visit: Payer: Self-pay

## 2024-12-21 MED ORDER — DULOXETINE HCL 30 MG PO CPEP: 90.0000 mg | ORAL_CAPSULE | Freq: Every day | ORAL | 0 refills | Status: AC

## 2024-12-21 MED ORDER — PRAMIPEXOLE DIHYDROCHLORIDE 1.5 MG PO TABS
1.5000 mg | ORAL_TABLET | Freq: Every day | ORAL | 1 refills | Status: AC
  Filled 2024-12-21: qty 90, 90d supply, fill #0

## 2024-12-21 MED ORDER — TRAZODONE HCL 100 MG PO TABS: 100.0000 mg | ORAL_TABLET | Freq: Every evening | ORAL | 1 refills | Status: AC | PRN

## 2024-12-21 MED ORDER — BUSPIRONE HCL 30 MG PO TABS
30.0000 mg | ORAL_TABLET | Freq: Two times a day (BID) | ORAL | 0 refills | Status: AC
  Filled 2024-12-21: qty 180, 90d supply, fill #0

## 2024-12-24 ENCOUNTER — Other Ambulatory Visit (HOSPITAL_COMMUNITY): Payer: Self-pay

## 2024-12-25 ENCOUNTER — Other Ambulatory Visit (HOSPITAL_COMMUNITY): Payer: Self-pay

## 2024-12-28 ENCOUNTER — Ambulatory Visit: Admitting: Podiatry

## 2024-12-28 ENCOUNTER — Encounter: Payer: Self-pay | Admitting: Podiatry

## 2024-12-28 DIAGNOSIS — M7671 Peroneal tendinitis, right leg: Secondary | ICD-10-CM | POA: Diagnosis not present

## 2024-12-28 DIAGNOSIS — M722 Plantar fascial fibromatosis: Secondary | ICD-10-CM | POA: Diagnosis not present

## 2024-12-28 MED ORDER — TRIAMCINOLONE ACETONIDE 10 MG/ML IJ SUSP
10.0000 mg | Freq: Once | INTRAMUSCULAR | Status: AC
  Administered 2024-12-28: 10 mg via INTRA_ARTICULAR

## 2024-12-28 NOTE — Progress Notes (Signed)
 Subjective:   Patient ID: [name], female   DOB: 49 y.o.   MRN: [id number]   HPI Patient states she is having a lot of pain still in the left heel and the outside of the right foot and states that she simply cannot have surgery yet due to her schedule but she is looking for relief that can hopefully last her 3 to 4 months.   ROS      Objective:  Physical Exam  Neurovascular status intact with patient noted to have acute discomfort in the plantar aspect left foot and into the lateral side of the right foot around the base of the fifth metatarsal with offloading     Assessment:  Acute plantar fasciitis left peroneal tendinitis right     Plan:  H&P reviewed both conditions and today I went ahead and we will gena try to buy her more time I did sterile prep I injected the plantar fascia at insertion left 3 mg Kenalog  5 mg alternative for the right I did explain risk going injected the sheath 3 mg dexamethasone  Kenalog  5 mg Xylocaine  applied sterile dressing will reappoint knowing surgery will be necessary in future

## 2025-01-03 ENCOUNTER — Encounter: Admitting: Family Medicine

## 2025-01-09 ENCOUNTER — Institutional Professional Consult (permissible substitution): Admitting: Neurology

## 2025-02-17 ENCOUNTER — Encounter: Admitting: Family
